# Patient Record
Sex: Female | Born: 1970
Health system: Southern US, Community
[De-identification: ages and names within clinical notes are randomized; demographics above are authoritative.]

## PROBLEM LIST (undated history)

## (undated) ENCOUNTER — Emergency Department (HOSPITAL_COMMUNITY): Discharge status: Absent without leave | Payer: Self-pay

## (undated) DIAGNOSIS — J189 Pneumonia, unspecified organism: Secondary | ICD-10-CM

## (undated) DIAGNOSIS — G5603 Carpal tunnel syndrome, bilateral upper limbs: Secondary | ICD-10-CM

## (undated) DIAGNOSIS — F419 Anxiety disorder, unspecified: Secondary | ICD-10-CM

## (undated) DIAGNOSIS — D649 Anemia, unspecified: Secondary | ICD-10-CM

## (undated) DIAGNOSIS — K219 Gastro-esophageal reflux disease without esophagitis: Secondary | ICD-10-CM

## (undated) DIAGNOSIS — M170 Bilateral primary osteoarthritis of knee: Secondary | ICD-10-CM

## (undated) DIAGNOSIS — J45909 Unspecified asthma, uncomplicated: Secondary | ICD-10-CM

## (undated) DIAGNOSIS — D869 Sarcoidosis, unspecified: Secondary | ICD-10-CM

## (undated) DIAGNOSIS — M4800 Spinal stenosis, site unspecified: Secondary | ICD-10-CM

## (undated) DIAGNOSIS — E785 Hyperlipidemia, unspecified: Secondary | ICD-10-CM

## (undated) DIAGNOSIS — M5136 Other intervertebral disc degeneration, lumbar region: Secondary | ICD-10-CM

## (undated) DIAGNOSIS — M51369 Other intervertebral disc degeneration, lumbar region without mention of lumbar back pain or lower extremity pain: Secondary | ICD-10-CM

## (undated) DIAGNOSIS — G4733 Obstructive sleep apnea (adult) (pediatric): Secondary | ICD-10-CM

## (undated) DIAGNOSIS — T7840XA Allergy, unspecified, initial encounter: Secondary | ICD-10-CM

## (undated) DIAGNOSIS — M069 Rheumatoid arthritis, unspecified: Secondary | ICD-10-CM

## (undated) DIAGNOSIS — E559 Vitamin D deficiency, unspecified: Secondary | ICD-10-CM

## (undated) DIAGNOSIS — I10 Essential (primary) hypertension: Secondary | ICD-10-CM

## (undated) DIAGNOSIS — F32A Depression, unspecified: Secondary | ICD-10-CM

## (undated) DIAGNOSIS — R7303 Prediabetes: Secondary | ICD-10-CM

## (undated) DIAGNOSIS — E119 Type 2 diabetes mellitus without complications: Secondary | ICD-10-CM

## (undated) HISTORY — DX: Vitamin D deficiency, unspecified: E55.9

## (undated) HISTORY — DX: Essential (primary) hypertension: I10

## (undated) HISTORY — PX: ESOPHAGOGASTRODUODENOSCOPY: SHX1529

## (undated) HISTORY — DX: Other intervertebral disc degeneration, lumbar region without mention of lumbar back pain or lower extremity pain: M51.369

## (undated) HISTORY — DX: Spinal stenosis, site unspecified: M48.00

## (undated) HISTORY — PX: ABDOMINAL HYSTERECTOMY: SHX81

## (undated) HISTORY — DX: Allergy, unspecified, initial encounter: T78.40XA

## (undated) HISTORY — DX: Bilateral primary osteoarthritis of knee: M17.0

## (undated) HISTORY — DX: Sarcoidosis, unspecified: D86.9

## (undated) HISTORY — DX: Unspecified asthma, uncomplicated: J45.909

## (undated) HISTORY — DX: Type 2 diabetes mellitus without complications: E11.9

## (undated) HISTORY — DX: Obstructive sleep apnea (adult) (pediatric): G47.33

## (undated) HISTORY — PX: FOOT SURGERY: SHX648

## (undated) HISTORY — DX: Hyperlipidemia, unspecified: E78.5

## (undated) HISTORY — DX: Gastro-esophageal reflux disease without esophagitis: K21.9

## (undated) HISTORY — DX: Carpal tunnel syndrome, bilateral upper limbs: G56.03

## (undated) HISTORY — DX: Other intervertebral disc degeneration, lumbar region: M51.36

## (undated) HISTORY — DX: Anxiety disorder, unspecified: F41.9

---

## 1998-04-27 ENCOUNTER — Other Ambulatory Visit: Admission: RE | Admit: 1998-04-27 | Discharge: 1998-04-27 | Payer: Self-pay | Admitting: Nephrology

## 1999-06-16 ENCOUNTER — Other Ambulatory Visit: Admission: RE | Admit: 1999-06-16 | Discharge: 1999-06-16 | Payer: Self-pay | Admitting: *Deleted

## 2000-12-05 ENCOUNTER — Other Ambulatory Visit: Admission: RE | Admit: 2000-12-05 | Discharge: 2000-12-05 | Payer: Self-pay | Admitting: Obstetrics

## 2001-11-18 ENCOUNTER — Encounter: Payer: Self-pay | Admitting: Nephrology

## 2001-11-18 ENCOUNTER — Encounter: Admission: RE | Admit: 2001-11-18 | Discharge: 2001-11-18 | Payer: Self-pay | Admitting: Nephrology

## 2002-02-11 ENCOUNTER — Encounter: Admission: RE | Admit: 2002-02-11 | Discharge: 2002-02-11 | Payer: Self-pay | Admitting: Occupational Medicine

## 2002-02-11 ENCOUNTER — Encounter: Payer: Self-pay | Admitting: Occupational Medicine

## 2004-08-03 ENCOUNTER — Emergency Department (HOSPITAL_COMMUNITY): Admission: EM | Admit: 2004-08-03 | Discharge: 2004-08-03 | Payer: Self-pay | Admitting: Emergency Medicine

## 2005-02-14 ENCOUNTER — Emergency Department (HOSPITAL_COMMUNITY): Admission: EM | Admit: 2005-02-14 | Discharge: 2005-02-14 | Payer: Self-pay | Admitting: Family Medicine

## 2005-04-15 ENCOUNTER — Emergency Department (HOSPITAL_COMMUNITY): Admission: EM | Admit: 2005-04-15 | Discharge: 2005-04-16 | Payer: Self-pay | Admitting: *Deleted

## 2005-04-19 ENCOUNTER — Encounter: Admission: RE | Admit: 2005-04-19 | Discharge: 2005-04-19 | Payer: Self-pay | Admitting: Family Medicine

## 2005-04-21 ENCOUNTER — Encounter: Admission: RE | Admit: 2005-04-21 | Discharge: 2005-05-19 | Payer: Self-pay | Admitting: Family Medicine

## 2005-12-22 ENCOUNTER — Encounter: Admission: RE | Admit: 2005-12-22 | Discharge: 2005-12-22 | Payer: Self-pay | Admitting: Nephrology

## 2006-01-03 ENCOUNTER — Ambulatory Visit: Payer: Self-pay | Admitting: Internal Medicine

## 2006-02-01 ENCOUNTER — Ambulatory Visit: Payer: Self-pay | Admitting: Internal Medicine

## 2006-03-22 ENCOUNTER — Ambulatory Visit (HOSPITAL_COMMUNITY): Admission: RE | Admit: 2006-03-22 | Discharge: 2006-03-22 | Payer: Self-pay | Admitting: Obstetrics

## 2006-04-02 ENCOUNTER — Encounter: Admission: RE | Admit: 2006-04-02 | Discharge: 2006-04-02 | Payer: Self-pay | Admitting: Nephrology

## 2006-04-05 ENCOUNTER — Ambulatory Visit (HOSPITAL_COMMUNITY): Admission: RE | Admit: 2006-04-05 | Discharge: 2006-04-05 | Payer: Self-pay | Admitting: Nephrology

## 2006-04-17 ENCOUNTER — Ambulatory Visit: Payer: Self-pay | Admitting: Gastroenterology

## 2007-05-01 ENCOUNTER — Ambulatory Visit: Payer: Self-pay | Admitting: Gastroenterology

## 2007-05-01 LAB — CONVERTED CEMR LAB
Albumin: 3 g/dL — ABNORMAL LOW (ref 3.5–5.2)
Basophils Absolute: 0 10*3/uL (ref 0.0–0.1)
Basophils Relative: 0.3 % (ref 0.0–1.0)
Bilirubin, Direct: 0.1 mg/dL (ref 0.0–0.3)
CO2: 29 meq/L (ref 19–32)
Eosinophils Relative: 1.5 % (ref 0.0–5.0)
GFR calc Af Amer: 121 mL/min
Glucose, Bld: 103 mg/dL — ABNORMAL HIGH (ref 70–99)
Lymphocytes Relative: 35.1 % (ref 12.0–46.0)
MCHC: 33.2 g/dL (ref 30.0–36.0)
Monocytes Absolute: 0.8 10*3/uL — ABNORMAL HIGH (ref 0.2–0.7)
Neutrophils Relative %: 55.9 % (ref 43.0–77.0)
Platelets: 373 10*3/uL (ref 150–400)
Potassium: 3.8 meq/L (ref 3.5–5.1)
RDW: 14 % (ref 11.5–14.6)
Sodium: 144 meq/L (ref 135–145)
WBC: 11.8 10*3/uL — ABNORMAL HIGH (ref 4.5–10.5)

## 2007-05-07 ENCOUNTER — Ambulatory Visit: Payer: Self-pay | Admitting: Gastroenterology

## 2007-05-17 ENCOUNTER — Encounter: Admission: RE | Admit: 2007-05-17 | Discharge: 2007-05-17 | Payer: Self-pay | Admitting: Neurology

## 2007-08-07 ENCOUNTER — Encounter: Admission: RE | Admit: 2007-08-07 | Discharge: 2007-08-07 | Payer: Self-pay | Admitting: Neurology

## 2007-10-08 ENCOUNTER — Ambulatory Visit (HOSPITAL_COMMUNITY): Admission: RE | Admit: 2007-10-08 | Discharge: 2007-10-08 | Payer: Self-pay | Admitting: Obstetrics

## 2007-10-29 ENCOUNTER — Ambulatory Visit (HOSPITAL_COMMUNITY): Admission: RE | Admit: 2007-10-29 | Discharge: 2007-10-29 | Payer: Self-pay | Admitting: Obstetrics

## 2007-10-29 ENCOUNTER — Encounter (INDEPENDENT_AMBULATORY_CARE_PROVIDER_SITE_OTHER): Payer: Self-pay | Admitting: Obstetrics

## 2008-10-14 ENCOUNTER — Ambulatory Visit (HOSPITAL_COMMUNITY): Admission: RE | Admit: 2008-10-14 | Discharge: 2008-10-14 | Payer: Self-pay | Admitting: Emergency Medicine

## 2009-10-11 ENCOUNTER — Encounter (INDEPENDENT_AMBULATORY_CARE_PROVIDER_SITE_OTHER): Payer: Self-pay | Admitting: Obstetrics and Gynecology

## 2009-10-11 ENCOUNTER — Ambulatory Visit (HOSPITAL_COMMUNITY): Admission: RE | Admit: 2009-10-11 | Discharge: 2009-10-12 | Payer: Self-pay | Admitting: Obstetrics and Gynecology

## 2010-10-05 ENCOUNTER — Encounter: Admission: RE | Admit: 2010-10-05 | Discharge: 2010-10-05 | Payer: Self-pay | Admitting: Obstetrics and Gynecology

## 2010-12-20 ENCOUNTER — Emergency Department (HOSPITAL_COMMUNITY)
Admission: EM | Admit: 2010-12-20 | Discharge: 2010-12-20 | Payer: Self-pay | Source: Home / Self Care | Admitting: Emergency Medicine

## 2010-12-22 ENCOUNTER — Encounter
Admission: RE | Admit: 2010-12-22 | Discharge: 2010-12-22 | Payer: Self-pay | Source: Home / Self Care | Attending: Internal Medicine | Admitting: Internal Medicine

## 2011-01-08 ENCOUNTER — Encounter
Admission: RE | Admit: 2011-01-08 | Discharge: 2011-01-08 | Payer: Self-pay | Source: Home / Self Care | Attending: Geriatric Medicine | Admitting: Geriatric Medicine

## 2011-01-15 ENCOUNTER — Encounter: Payer: Self-pay | Admitting: Neurology

## 2011-01-15 ENCOUNTER — Encounter: Payer: Self-pay | Admitting: Nephrology

## 2011-01-15 ENCOUNTER — Encounter: Payer: Self-pay | Admitting: Obstetrics

## 2011-03-06 LAB — POCT URINALYSIS DIPSTICK
Bilirubin Urine: NEGATIVE
Glucose, UA: NEGATIVE mg/dL
Ketones, ur: NEGATIVE mg/dL
Specific Gravity, Urine: >=1.03 (ref 1.005–1.030)

## 2011-03-06 LAB — GAMMA GT: GGT: 102 U/L — ABNORMAL HIGH (ref 7–51)

## 2011-03-06 LAB — POCT PREGNANCY, URINE: Preg Test, Ur: NEGATIVE

## 2011-03-06 LAB — AST: AST: 22 U/L (ref 0–37)

## 2011-03-30 LAB — BASIC METABOLIC PANEL
BUN: 4 mg/dL — ABNORMAL LOW (ref 6–23)
BUN: 8 mg/dL (ref 6–23)
CO2: 24 mEq/L (ref 19–32)
Calcium: 9.2 mg/dL (ref 8.4–10.5)
Calcium: 9.3 mg/dL (ref 8.4–10.5)
GFR calc non Af Amer: >60 mL/min (ref >60)
GFR calc non Af Amer: >60 mL/min (ref >60)
Glucose, Bld: 123 mg/dL — ABNORMAL HIGH (ref 70–99)
Glucose, Bld: 90 mg/dL (ref 70–99)
Sodium: 137 mEq/L (ref 135–145)

## 2011-03-30 LAB — HIV ANTIBODY (ROUTINE TESTING W REFLEX): HIV: NONREACTIVE

## 2011-03-30 LAB — CBC
HCT: 41 % (ref 36.0–46.0)
Hemoglobin: 13.5 g/dL (ref 12.0–15.0)
Hemoglobin: 13.3 g/dL (ref 12.0–15.0)
MCHC: 32.7 g/dL (ref 30.0–36.0)
MCHC: 32.5 g/dL (ref 30.0–36.0)
MCV: 83.4 fL (ref 78.0–100.0)
Platelets: 296 10*3/uL (ref 150–400)
RDW: 12.9 % (ref 11.5–15.5)

## 2011-03-30 LAB — RPR: RPR Ser Ql: NONREACTIVE

## 2011-05-09 NOTE — Op Note (Signed)
Robin [name], Robin [name]    ACCOUNT NO.:  [id number]   MEDICAL RECORD NO.:  [id number]          PATIENT TYPE:  AMB   LOCATION:  SDC                           FACILITY:  [facility]   PHYSICIAN:  Kathreen Cosier, M.D.DATE OF BIRTH:  [birth year]   DATE OF PROCEDURE:  10/29/2007  DATE OF DISCHARGE:                               OPERATIVE REPORT   PREOPERATIVE DIAGNOSIS:  Dysfunctional uterine bleeding.   POSTOPERATIVE DIAGNOSIS:  Dysfunctional uterine bleeding.   Under general anesthesia with the patient in the lithotomy position, the  perineum and vagina were prepped and draped, and  bladder emptied with  straight catheter.  Bimanual exam revealed the uterus to be enlarged.  Adnexa negative.  A speculum was inserted in the vagina.  The cervix  injected with 10 mL 1% Xylocaine and the endometrial cavity sounded 10  cm. The endocervix was curetted and a small amount of tissue obtained.  The cervical length was then measured at 5 cm giving a cavity length of  5 cm.  The cervix was dilated to a #25 Pratt and hysteroscope inserted.  She had a normal cavity.  The hysteroscope was removed and a sharp  curettage done.  A moderate amount of tissue was obtained.  The cavity  width was 4.85 cm.  The NovaSure device was inserted and the cavity  integrity established.  Ablation was performed at 124 watts for 60  seconds. Repeat hysteroscopy showed total ablation of the cavity.  The  patient tolerated the procedure well.  The fluid deficit was 50 mL.           ______________________________  Kathreen Cosier, M.D.     BAM/MEDQ  D:  10/29/2007  T:  10/29/2007  Job:  [id number]

## 2011-05-09 NOTE — Assessment & Plan Note (Signed)
[facility]                         GASTROENTEROLOGY OFFICE NOTE   NAME:Robin [name]             MRN:          [id number]  DATE:05/01/2007                            DOB:          Jan 08, 1971    PRIMARY CARE PHYSICIAN:  Dr. Jeri Cos   GI PROBLEM LIST:  1. Morbid obesity.  2. Likely gastroesophageal reflux disease. Classic pyrosis for years.      Discomforts when bending over or burning worse at night.  Was      scheduled to have EGD April 2007, patient cancelled the      appointment.   INTERVAL HISTORY:  I last saw Robin [name] a year ago for rather classic  GERD symptoms, intermittent vomiting and intermittent dysphagia.  I  arranged for her to have an EGD but she cancelled the procedure and I  have not heard from her since.  My recommendations at that time were for  her to take her PPI (OTC Prilosec) 20 to 30 minutes prior to a meal as  she seemed to be taking it at an incorrect time in relation to food.  She had a recent CT scan April 2007, with IV and oral contrast and it  was normal.  She had a recent upper GI series and that was normal except  for a patchless GE junction with obvious refluxing.   She now returns with many of the same complaints as well as some new  complaints.  She describes a vomiting and diarrhea illness about a week  ago, lasted for 2 to 3 days.  She had 10 to 15 watery non-bloody stools  during that time.  She works at a nursing home, thought she may have  contracted something.  Her stools have gone back to normal, except for  they are a bit looser than usual.  She is only having 1 bowel movement a  day now.  She has a variety of upper GI complaints including vomiting  every 1 to 2 months.  Rare, intermittent solid food dysphagia,  discomfort in her right upper quadrant, that seems rather constant.   CURRENT MEDICATIONS:  1. Sertraline.  2. Prilosec OTC.   PHYSICAL EXAMINATION:  Weight is 313 pounds,  which is up 27 pounds since  her last visit. Blood pressure 124/82, pulse 92.  CONSTITUTIONAL:  Morbidly obese, otherwise well-appearing.  LUNGS:  Clear to auscultation bilaterally.  HEART:  Regular rate and rhythm.  ABDOMEN:  Soft, nontender, nondistended, normal bowel sounds.   ASSESSMENT AND PLAN:  A 40 year old woman with multiple upper  gastrointestinal complaints.   I think we should proceed with esophagogastroduodenoscopy at her soonest  convenience.  I recommended this a year ago and she cancelled at the  last minute, hopefully she will follow through this time.  She does seem  to have rather classic gastroesophageal reflux disease symptoms and last  year I recommended that she begin taking her proton pump inhibitor 20 to  30 minutes prior to a meal.  She has not been doing that, she takes it  at bedtime and sometimes after a meal.  I recommended again that she  take it 20 to 30  minutes prior to either breakfast or lunch meal, that  is the way the pill is designed to work most effectively.  She believes  she is lactose intolerance and I educated her on lactate pills and  lactate milk, given her gastroesophageal reflux disease handout as well  as a lactose intolerance handout.  I also explained that many of her  discomforts could be weight related.  She has gained 27 pounds in a year  and is quite obese.  I recommended a slow gradual weight loss program  and that may alleviate many of her abdominal discomforts.  She will get  a basic set of labs today in addition to the esophagogastroduodenoscopy  that we are scheduling.     Rachael Fee, MD  Electronically Signed    DPJ/MedQ  DD: 05/01/2007  DT: 05/01/2007  Job #: [id number]   cc:   Jarome Matin, M.D.

## 2011-10-04 LAB — CBC
MCHC: 33
MCV: 76.7 — ABNORMAL LOW
Platelets: 432 — ABNORMAL HIGH
RBC: 4.65
WBC: 11.3 — ABNORMAL HIGH

## 2011-10-04 LAB — DIFFERENTIAL
Basophils Relative: 1
Eosinophils Absolute: 0.2
Neutro Abs: 6
Neutrophils Relative %: 53

## 2011-10-04 LAB — PREGNANCY, URINE: Preg Test, Ur: NEGATIVE

## 2011-10-17 ENCOUNTER — Other Ambulatory Visit: Payer: Self-pay | Admitting: Internal Medicine

## 2011-10-17 DIAGNOSIS — Z1231 Encounter for screening mammogram for malignant neoplasm of breast: Secondary | ICD-10-CM

## 2011-10-25 ENCOUNTER — Inpatient Hospital Stay: Admission: RE | Admit: 2011-10-25 | Payer: Self-pay | Source: Ambulatory Visit

## 2012-10-14 ENCOUNTER — Ambulatory Visit
Admission: RE | Admit: 2012-10-14 | Discharge: 2012-10-14 | Disposition: A | Discharge status: Home / Self Care | Payer: BC Managed Care – PPO | Source: Ambulatory Visit | Attending: Internal Medicine | Admitting: Internal Medicine

## 2012-10-14 DIAGNOSIS — Z1231 Encounter for screening mammogram for malignant neoplasm of breast: Secondary | ICD-10-CM

## 2013-10-07 ENCOUNTER — Other Ambulatory Visit: Payer: Self-pay

## 2013-10-07 DIAGNOSIS — Z1231 Encounter for screening mammogram for malignant neoplasm of breast: Secondary | ICD-10-CM

## 2013-10-29 ENCOUNTER — Ambulatory Visit
Admission: RE | Admit: 2013-10-29 | Discharge: 2013-10-29 | Disposition: A | Discharge status: Home / Self Care | Payer: BC Managed Care – PPO | Source: Ambulatory Visit

## 2013-10-29 DIAGNOSIS — Z1231 Encounter for screening mammogram for malignant neoplasm of breast: Secondary | ICD-10-CM

## 2014-02-16 ENCOUNTER — Other Ambulatory Visit: Payer: Self-pay | Admitting: Internal Medicine

## 2014-02-16 DIAGNOSIS — E010 Iodine-deficiency related diffuse (endemic) goiter: Secondary | ICD-10-CM

## 2014-02-19 ENCOUNTER — Other Ambulatory Visit: Payer: BC Managed Care – PPO

## 2014-02-23 ENCOUNTER — Ambulatory Visit
Admission: RE | Admit: 2014-02-23 | Discharge: 2014-02-23 | Disposition: A | Discharge status: Home / Self Care | Payer: BC Managed Care – PPO | Source: Ambulatory Visit | Attending: Internal Medicine | Admitting: Internal Medicine

## 2014-02-23 DIAGNOSIS — E010 Iodine-deficiency related diffuse (endemic) goiter: Secondary | ICD-10-CM

## 2014-09-16 ENCOUNTER — Ambulatory Visit (HOSPITAL_BASED_OUTPATIENT_CLINIC_OR_DEPARTMENT_OTHER): Payer: BC Managed Care – PPO

## 2014-10-20 ENCOUNTER — Ambulatory Visit (INDEPENDENT_AMBULATORY_CARE_PROVIDER_SITE_OTHER): Payer: BLUE CROSS/BLUE SHIELD

## 2014-10-20 VITALS — BP 150/80 | HR 68 | Resp 14 | Ht 70.0 in | Wt 225.0 lb

## 2014-10-20 DIAGNOSIS — M79671 Pain in right foot: Secondary | ICD-10-CM

## 2014-10-20 DIAGNOSIS — M722 Plantar fascial fibromatosis: Secondary | ICD-10-CM

## 2014-10-20 DIAGNOSIS — M7731 Calcaneal spur, right foot: Secondary | ICD-10-CM

## 2014-10-20 MED ORDER — TRIAMCINOLONE ACETONIDE 10 MG/ML IJ SUSP: 10.0000 mg | Freq: Once | INTRAMUSCULAR | Status: DC

## 2014-10-20 MED ORDER — MELOXICAM 15 MG PO TABS: 15.0000 mg | ORAL_TABLET | Freq: Every day | ORAL | Status: DC

## 2014-10-20 NOTE — Progress Notes (Signed)
   Subjective:    Patient ID: Robin [name], female    DOB: 1971/10/17, 43 y.o.   MRN: [id number]  HPI Comments: N heel pain L right heel and inferior arch  D 2 weeks O then walked 1/2 marathon on 10/17/2014 C swelling, severe sharp pain A stretching, standing, worse after rest and in the morning T frozen water bottle, Ibuprofen     Review of Systems  All other systems reviewed and are negative.      Objective:   Physical Exam 43 year old F connecting female well-developed well-nourished oriented 3 presents at this time with a several month history of at least 2 week history of severe pain inferior right heel pain on first step or getting a fall. Of rest runner didn't half marathon release she's been stretching standing Weight on her heel using frozen bottle of water and ibuprofen. Lower extremity objective findings reveal neurovascular status is intact pedal pulses are palpable DP +2 PT +2 over 4 Refill time 3 seconds epicritic and proprioceptive sensations intact and symmetric. Patient has per 3 foot type with pes planus past valgus deformity right more so than left history of previous bunion surgery with residual hallux limitus rigidus and arthropathy first MTP joint noted. X-rays reveal well-developed inferior calcaneal spur sticking a plantar fascial structuresof fracture or osseous abnormality noted remainder the exam unremarkable       Assessment & Plan:  Assessment plantar fasciitis O'Shields procedure right at this time injection time of Kenalog 20 mg Xylocaine plain to the right heel fascial strapping was applied to the right foot for 5 days also prescription for mobility is given his alternatives to ibuprofen recommend ice to heal every evening recheck in 2 weeks may be candidate for orthoses based on progress decrease weight working activities and exercise activities no ballistic activities minimal walking on level surfaces maintain a stable shoe no barefoot no  flimsy shoes or flip-flops X  Alvan Dame DPM

## 2014-10-20 NOTE — Patient Instructions (Signed)
Plantar Fasciitis Plantar fasciitis is a common condition that causes foot pain. It is soreness (inflammation) of the band of tough fibrous tissue on the bottom of the foot that runs from the heel bone (calcaneus) to the ball of the foot. The cause of this soreness may be from excessive standing, poor fitting shoes, running on hard surfaces, being overweight, having an abnormal walk, or overuse (this is common in runners) of the painful foot or feet. It is also common in aerobic exercise dancers and ballet dancers. SYMPTOMS  Most people with plantar fasciitis complain of:  Severe pain in the morning on the bottom of their foot especially when taking the first steps out of bed. This pain recedes after a few minutes of walking.  Severe pain is experienced also during walking following a long period of inactivity.  Pain is worse when walking barefoot or up stairs DIAGNOSIS   Your caregiver will diagnose this condition by examining and feeling your foot.  Special tests such as X-rays of your foot, are usually not needed. PREVENTION   Consult a sports medicine professional before beginning a new exercise program.  Walking programs offer a good workout. With walking there is a lower chance of overuse injuries common to runners. There is less impact and less jarring of the joints.  Begin all new exercise programs slowly. If problems or pain develop, decrease the amount of time or distance until you are at a comfortable level.  Wear good shoes and replace them regularly.  Stretch your foot and the heel cords at the back of the ankle (Achilles tendon) both before and after exercise.  Run or exercise on even surfaces that are not hard. For example, asphalt is better than pavement.  Do not run barefoot on hard surfaces.  If using a treadmill, vary the incline.  Do not continue to workout if you have foot or joint problems. Seek professional help if they do not improve. HOME CARE INSTRUCTIONS     Avoid activities that cause you pain until you recover.  Use ice or cold packs on the problem or painful areas after working out.  Only take over-the-counter or prescription medicines for pain, discomfort, or fever as directed by your caregiver.  Soft shoe inserts or athletic shoes with air or gel sole cushions may be helpful.  If problems continue or become more severe, consult a sports medicine caregiver or your own health care provider. Cortisone is a potent anti-inflammatory medication that may be injected into the painful area. You can discuss this treatment with your caregiver. MAKE SURE YOU:   Understand these instructions.  Will watch your condition.  Will get help right away if you are not doing well or get worse. Document Released: 09/05/2001 Document Revised: 03/04/2012 Document Reviewed: 11/04/2008 ExitCare Patient Information 2015 [employer]. This information is not intended to replace advice given to you by your health care provider. Make sure you discuss any questions you have with your health care provider.    ICE INSTRUCTIONS  Apply ice or cold pack to the affected area at least 3 times a day for 10-15 minutes each time.  You should also use ice after prolonged activity or vigorous exercise.  Do not apply ice longer than 20 minutes at one time.  Always keep a cloth between your skin and the ice pack to prevent burns.  Being consistent and following these instructions will help control your symptoms.  We suggest you purchase a gel ice pack because they are   reusable and do bit leak.  Some of them are designed to wrap around the area.  Use the method that works best for you.  Here are some other suggestions for icing.   Use a frozen bag of peas or corn-inexpensive and molds well to your body, usually stays frozen for 10 to 20 minutes.  Wet a towel with cold water and squeeze out the excess until it's damp.  Place in a bag in the freezer for 20 minutes. Then remove  and use. 

## 2014-10-26 ENCOUNTER — Ambulatory Visit (INDEPENDENT_AMBULATORY_CARE_PROVIDER_SITE_OTHER): Payer: BC Managed Care – PPO | Admitting: Pulmonary Disease

## 2014-10-26 ENCOUNTER — Encounter: Payer: Self-pay | Admitting: Pulmonary Disease

## 2014-10-26 VITALS — BP 120/62 | HR 63 | Temp 98.0°F | Ht 71.0 in | Wt 281.8 lb

## 2014-10-26 DIAGNOSIS — G4733 Obstructive sleep apnea (adult) (pediatric): Secondary | ICD-10-CM

## 2014-10-26 NOTE — Assessment & Plan Note (Signed)
The patient has a history of significant obstructive sleep apnea, and currently is having issues with her device. She is complaining of a strong odor, and also has not had updated supplies in quite some time. She has lost significant weight since her study in 2010, but clearly has persistent sleep apnea by her history. I would like to get her a new device, and also re-optimize her pressure. I have also encouraged her to work aggressively on weight loss.

## 2014-10-26 NOTE — Progress Notes (Signed)
Subjective:    Patient ID: Robin [name], female    DOB: [birth year], 43 y.o.   MRN: [id number]  HPI The patient is a 43 year old female who I've been asked to see for management of obstructive sleep apnea. She was apparently diagnosed with significant sleep apnea in 2010, and was started on C Pap. She has not had any updates or new supplies since that time, and tells me that her machine is malodorous and her mask and tubing or falling apart. This has made it very difficult to wear the C Pap. She is also lost over 80 pounds since her initial study, but still has symptoms consistent with sleep disordered breathing. The patient has been noted to have loud snoring, but no one has mentioned witnessed apneas. She denies gasping arousals. She is unrested about 50% of the mornings, but does not have issues with sleepiness during the day while at work. However, she will take a nap every day after she returns from work, and still has issues with sleepiness watching television or movies in the evening despite this. She also gets sleepy driving longer distances. The patient's Epworth score today is 10.   Sleep Questionnaire What time do you typically go to bed?( Between what hours) 9-10 PM 9-10 PM at 1548 on 10/26/14 by Tommie Sams, CMA How long does it take you to fall asleep? 30-45 min 30-45 min at 1548 on 10/26/14 by Tommie Sams, CMA How many times during the night do you wake up? 5 5 at 1548 on 10/26/14 by Tommie Sams, CMA What time do you get out of bed to start your day? [id number] at 1548 on 10/26/14 by Tommie Sams, CMA Do you drive or operate heavy machinery in your occupation? No No at 1548 on 10/26/14 by Tommie Sams, CMA How much has your weight changed (up or down) over the past two years? (In pounds) 80 lb (36.288 kg) 80 lb (36.288 kg) at 1548 on 10/26/14 by Tommie Sams, CMA Have you ever had a sleep study before? Yes Yes at 1548 on 10/26/14 by Tommie Sams, CMA  If yes, location of study? ? ? at 1548 on 10/26/14 by Tommie Sams, CMA If yes, date of study? ?2011 ?2011 at 1548 on 10/26/14 by Tommie Sams, CMA Do you currently use CPAP? Yes Yes at 1548 on 10/26/14 by Tommie Sams, CMA If so, what pressure? not sure not sure at 1548 on 10/26/14 by Tommie Sams, CMA Do you wear oxygen at any time? No No at 1548 on 10/26/14 by Tommie Sams, CMA   Review of Systems  Constitutional: Negative for fever and unexpected weight change.  HENT: Negative for congestion, dental problem, ear pain, nosebleeds, postnasal drip, rhinorrhea, sinus pressure, sneezing, sore throat and trouble swallowing.   Eyes: Negative for redness and itching.  Respiratory: Positive for shortness of breath. Negative for cough, chest tightness and wheezing.   Cardiovascular: Negative for palpitations and leg swelling.  Gastrointestinal: Negative for nausea and vomiting.  Genitourinary: Negative for dysuria.  Musculoskeletal: Negative for joint swelling.  Skin: Negative for rash.  Neurological: Negative for headaches.  Hematological: Does not bruise/bleed easily.  Psychiatric/Behavioral: Negative for dysphoric mood. The patient is not nervous/anxious.        Objective:   Physical Exam Constitutional:  Overweight female, no acute distress  HENT:  Nares patent without discharge  Oropharynx without exudate, palate and uvula are elongated.  Eyes:  Perrla,  eomi, no scleral icterus  Neck:  No JVD, no TMG  Cardiovascular:  Normal rate, regular rhythm, no rubs or gallops.  No murmurs        Intact distal pulses  Pulmonary :  Normal breath sounds, no stridor or respiratory distress   No rales, rhonchi, or wheezing  Abdominal:  Soft, nondistended, bowel sounds present.  No tenderness noted.   Musculoskeletal:  mild lower extremity edema noted.  Lymph Nodes:  No cervical lymphadenopathy noted  Skin:  No cyanosis noted  Neurologic:  Alert, appropriate, moves all 4  extremities without obvious deficit.        Assessment & Plan:

## 2014-10-26 NOTE — Patient Instructions (Signed)
Will see about getting you a new cpap machine and supplies.  Will also re-optimize your pressure after your significant weight loss Keep working on weight loss followup with me 8 weeks after getting your new cpap device, but please call if having issues with tolerance.

## 2014-10-28 ENCOUNTER — Telehealth: Payer: Self-pay | Admitting: Pulmonary Disease

## 2014-10-28 NOTE — Telephone Encounter (Signed)
Called spoke with Melissa from [facility]. She reports pt received her machine from [facility] in 09/2010--pt not eligible for new machine until 10/2015. Pt has [employer] and they are requiring she go through the repair process for her current machine. They are going to set pt up with supplies. Sleep study is in [facility] look at.  Will make [facility] aware

## 2014-10-30 ENCOUNTER — Other Ambulatory Visit: Payer: Self-pay | Admitting: Pulmonary Disease

## 2014-10-30 DIAGNOSIS — G4733 Obstructive sleep apnea (adult) (pediatric): Secondary | ICD-10-CM

## 2014-10-30 NOTE — Telephone Encounter (Signed)
Just make sure they are going to repair her machine!!

## 2014-10-30 NOTE — Telephone Encounter (Signed)
Called and spoke with patient and she is aware that [employer] will be providing her a loaner auto cpap x 2 -3 wks. Pt is aware that she will be hearing from [facility] and to contact our office for download results after they pick up her loaner cpap. Pt voiced understanding and is aware. Rhonda J Cobb

## 2014-11-03 ENCOUNTER — Ambulatory Visit: Payer: BC Managed Care – PPO

## 2014-12-14 ENCOUNTER — Encounter: Payer: Self-pay | Admitting: Pulmonary Disease

## 2014-12-23 ENCOUNTER — Ambulatory Visit: Payer: BC Managed Care – PPO | Admitting: Pulmonary Disease

## 2016-05-11 DIAGNOSIS — F329 Major depressive disorder, single episode, unspecified: Secondary | ICD-10-CM | POA: Diagnosis not present

## 2016-05-11 DIAGNOSIS — I10 Essential (primary) hypertension: Secondary | ICD-10-CM | POA: Diagnosis not present

## 2016-10-06 ENCOUNTER — Other Ambulatory Visit: Payer: Self-pay | Admitting: Internal Medicine

## 2016-10-06 DIAGNOSIS — Z1231 Encounter for screening mammogram for malignant neoplasm of breast: Secondary | ICD-10-CM

## 2016-10-16 ENCOUNTER — Ambulatory Visit: Payer: Self-pay

## 2016-10-18 ENCOUNTER — Ambulatory Visit: Payer: Self-pay

## 2016-11-08 DIAGNOSIS — M1711 Unilateral primary osteoarthritis, right knee: Secondary | ICD-10-CM | POA: Diagnosis not present

## 2016-11-08 DIAGNOSIS — M25561 Pain in right knee: Secondary | ICD-10-CM | POA: Diagnosis not present

## 2016-11-08 DIAGNOSIS — G8929 Other chronic pain: Secondary | ICD-10-CM | POA: Diagnosis not present

## 2016-12-06 DIAGNOSIS — J45990 Exercise induced bronchospasm: Secondary | ICD-10-CM | POA: Diagnosis not present

## 2016-12-06 DIAGNOSIS — F3341 Major depressive disorder, recurrent, in partial remission: Secondary | ICD-10-CM | POA: Diagnosis not present

## 2016-12-06 DIAGNOSIS — R635 Abnormal weight gain: Secondary | ICD-10-CM | POA: Diagnosis not present

## 2016-12-06 DIAGNOSIS — Z23 Encounter for immunization: Secondary | ICD-10-CM | POA: Diagnosis not present

## 2016-12-06 DIAGNOSIS — I10 Essential (primary) hypertension: Secondary | ICD-10-CM | POA: Diagnosis not present

## 2016-12-06 DIAGNOSIS — E785 Hyperlipidemia, unspecified: Secondary | ICD-10-CM | POA: Diagnosis not present

## 2017-01-09 DIAGNOSIS — E785 Hyperlipidemia, unspecified: Secondary | ICD-10-CM | POA: Diagnosis not present

## 2017-01-09 DIAGNOSIS — Z0000 Encounter for general adult medical examination without abnormal findings: Secondary | ICD-10-CM | POA: Diagnosis not present

## 2017-04-30 DIAGNOSIS — M25532 Pain in left wrist: Secondary | ICD-10-CM | POA: Diagnosis not present

## 2017-04-30 DIAGNOSIS — D869 Sarcoidosis, unspecified: Secondary | ICD-10-CM | POA: Diagnosis not present

## 2017-04-30 DIAGNOSIS — M25531 Pain in right wrist: Secondary | ICD-10-CM | POA: Diagnosis not present

## 2017-04-30 DIAGNOSIS — E669 Obesity, unspecified: Secondary | ICD-10-CM | POA: Diagnosis not present

## 2017-05-25 ENCOUNTER — Other Ambulatory Visit: Payer: Self-pay | Admitting: Internal Medicine

## 2017-05-25 ENCOUNTER — Ambulatory Visit
Admission: RE | Admit: 2017-05-25 | Discharge: 2017-05-25 | Disposition: A | Discharge status: Home / Self Care | Payer: BLUE CROSS/BLUE SHIELD | Source: Ambulatory Visit | Attending: Internal Medicine | Admitting: Internal Medicine

## 2017-05-25 DIAGNOSIS — Z1231 Encounter for screening mammogram for malignant neoplasm of breast: Secondary | ICD-10-CM

## 2017-06-01 DIAGNOSIS — M25531 Pain in right wrist: Secondary | ICD-10-CM | POA: Diagnosis not present

## 2017-06-01 DIAGNOSIS — F418 Other specified anxiety disorders: Secondary | ICD-10-CM | POA: Diagnosis not present

## 2017-06-01 DIAGNOSIS — I10 Essential (primary) hypertension: Secondary | ICD-10-CM | POA: Diagnosis not present

## 2017-08-07 DIAGNOSIS — M62838 Other muscle spasm: Secondary | ICD-10-CM | POA: Diagnosis not present

## 2017-08-07 DIAGNOSIS — M47812 Spondylosis without myelopathy or radiculopathy, cervical region: Secondary | ICD-10-CM | POA: Diagnosis not present

## 2017-08-09 ENCOUNTER — Other Ambulatory Visit: Payer: Self-pay | Admitting: Internal Medicine

## 2017-08-09 ENCOUNTER — Ambulatory Visit
Admission: RE | Admit: 2017-08-09 | Discharge: 2017-08-09 | Disposition: A | Discharge status: Home / Self Care | Payer: BLUE CROSS/BLUE SHIELD | Source: Ambulatory Visit | Attending: Internal Medicine | Admitting: Internal Medicine

## 2017-08-09 DIAGNOSIS — M47812 Spondylosis without myelopathy or radiculopathy, cervical region: Secondary | ICD-10-CM

## 2017-08-09 DIAGNOSIS — M50322 Other cervical disc degeneration at C5-C6 level: Secondary | ICD-10-CM | POA: Diagnosis not present

## 2017-10-31 DIAGNOSIS — D869 Sarcoidosis, unspecified: Secondary | ICD-10-CM | POA: Diagnosis not present

## 2017-10-31 DIAGNOSIS — M542 Cervicalgia: Secondary | ICD-10-CM | POA: Diagnosis not present

## 2017-10-31 DIAGNOSIS — M150 Primary generalized (osteo)arthritis: Secondary | ICD-10-CM | POA: Diagnosis not present

## 2017-10-31 DIAGNOSIS — M2550 Pain in unspecified joint: Secondary | ICD-10-CM | POA: Diagnosis not present

## 2017-11-27 DIAGNOSIS — Z23 Encounter for immunization: Secondary | ICD-10-CM | POA: Diagnosis not present

## 2017-11-27 DIAGNOSIS — M62838 Other muscle spasm: Secondary | ICD-10-CM | POA: Diagnosis not present

## 2017-11-27 DIAGNOSIS — F339 Major depressive disorder, recurrent, unspecified: Secondary | ICD-10-CM | POA: Diagnosis not present

## 2017-11-27 DIAGNOSIS — Z6841 Body Mass Index (BMI) 40.0 and over, adult: Secondary | ICD-10-CM | POA: Diagnosis not present

## 2017-11-27 DIAGNOSIS — M47812 Spondylosis without myelopathy or radiculopathy, cervical region: Secondary | ICD-10-CM | POA: Diagnosis not present

## 2017-11-29 DIAGNOSIS — M150 Primary generalized (osteo)arthritis: Secondary | ICD-10-CM | POA: Diagnosis not present

## 2017-11-29 DIAGNOSIS — D869 Sarcoidosis, unspecified: Secondary | ICD-10-CM | POA: Diagnosis not present

## 2017-11-29 DIAGNOSIS — M2550 Pain in unspecified joint: Secondary | ICD-10-CM | POA: Diagnosis not present

## 2017-11-29 DIAGNOSIS — M542 Cervicalgia: Secondary | ICD-10-CM | POA: Diagnosis not present

## 2017-12-10 ENCOUNTER — Encounter (HOSPITAL_COMMUNITY): Payer: Self-pay | Admitting: Emergency Medicine

## 2017-12-10 ENCOUNTER — Emergency Department (HOSPITAL_COMMUNITY): Payer: BLUE CROSS/BLUE SHIELD

## 2017-12-10 ENCOUNTER — Other Ambulatory Visit: Payer: Self-pay

## 2017-12-10 ENCOUNTER — Emergency Department (HOSPITAL_COMMUNITY)
Admission: EM | Admit: 2017-12-10 | Discharge: 2017-12-10 | Disposition: A | Discharge status: Home / Self Care | Payer: BLUE CROSS/BLUE SHIELD | Attending: Emergency Medicine | Admitting: Emergency Medicine

## 2017-12-10 DIAGNOSIS — N838 Other noninflammatory disorders of ovary, fallopian tube and broad ligament: Secondary | ICD-10-CM | POA: Insufficient documentation

## 2017-12-10 DIAGNOSIS — Z79899 Other long term (current) drug therapy: Secondary | ICD-10-CM | POA: Insufficient documentation

## 2017-12-10 DIAGNOSIS — I10 Essential (primary) hypertension: Secondary | ICD-10-CM | POA: Insufficient documentation

## 2017-12-10 DIAGNOSIS — R1011 Right upper quadrant pain: Secondary | ICD-10-CM | POA: Diagnosis not present

## 2017-12-10 DIAGNOSIS — J45909 Unspecified asthma, uncomplicated: Secondary | ICD-10-CM | POA: Diagnosis not present

## 2017-12-10 DIAGNOSIS — Z87891 Personal history of nicotine dependence: Secondary | ICD-10-CM | POA: Insufficient documentation

## 2017-12-10 DIAGNOSIS — N83201 Unspecified ovarian cyst, right side: Secondary | ICD-10-CM | POA: Diagnosis not present

## 2017-12-10 DIAGNOSIS — N949 Unspecified condition associated with female genital organs and menstrual cycle: Secondary | ICD-10-CM

## 2017-12-10 DIAGNOSIS — D3501 Benign neoplasm of right adrenal gland: Secondary | ICD-10-CM | POA: Diagnosis not present

## 2017-12-10 LAB — URINALYSIS, ROUTINE W REFLEX MICROSCOPIC
BILIRUBIN URINE: NEGATIVE
Glucose, UA: NEGATIVE mg/dL
HGB URINE DIPSTICK: NEGATIVE
Ketones, ur: NEGATIVE mg/dL
Leukocytes, UA: NEGATIVE
NITRITE: NEGATIVE
PH: 7 (ref 5.0–8.0)
Protein, ur: NEGATIVE mg/dL
SPECIFIC GRAVITY, URINE: 1.011 (ref 1.005–1.030)

## 2017-12-10 LAB — I-STAT CHEM 8, ED
BUN: 7 mg/dL (ref 6–20)
BUN: 10 mg/dL (ref 6–20)
CALCIUM ION: 1.1 mmol/L — AB (ref 1.15–1.40)
CHLORIDE: 103 mmol/L (ref 101–111)
CREATININE: 0.7 mg/dL (ref 0.44–1.00)
Calcium, Ion: 1.15 mmol/L (ref 1.15–1.40)
Chloride: 101 mmol/L (ref 101–111)
Creatinine, Ser: 0.7 mg/dL (ref 0.44–1.00)
Glucose, Bld: 101 mg/dL — ABNORMAL HIGH (ref 65–99)
Glucose, Bld: 99 mg/dL (ref 65–99)
HCT: 41 % (ref 36.0–46.0)
HCT: 49 % — ABNORMAL HIGH (ref 36.0–46.0)
HEMOGLOBIN: 13.9 g/dL (ref 12.0–15.0)
Hemoglobin: 16.7 g/dL — ABNORMAL HIGH (ref 12.0–15.0)
POTASSIUM: 3.3 mmol/L — AB (ref 3.5–5.1)
POTASSIUM: 5.5 mmol/L — AB (ref 3.5–5.1)
SODIUM: 140 mmol/L (ref 135–145)
SODIUM: 137 mmol/L (ref 135–145)
TCO2: 26 mmol/L (ref 22–32)
TCO2: 28 mmol/L (ref 22–32)

## 2017-12-10 LAB — CBG MONITORING, ED: GLUCOSE-CAPILLARY: 94 mg/dL (ref 65–99)

## 2017-12-10 LAB — I-STAT BETA HCG BLOOD, ED (MC, WL, AP ONLY): I-stat hCG, quantitative: <5 m[IU]/mL (ref <5)

## 2017-12-10 MED ORDER — ONDANSETRON 4 MG PO TBDP
4.0000 mg | ORAL_TABLET | Freq: Once | ORAL | Status: AC
  Administered 2017-12-10: 4 mg via ORAL
  Filled 2017-12-10: qty 1

## 2017-12-10 MED ORDER — ONDANSETRON 4 MG PO TBDP: 4.0000 mg | ORAL_TABLET | Freq: Three times a day (TID) | ORAL | 0 refills | Status: DC | PRN

## 2017-12-10 MED ORDER — KETOROLAC TROMETHAMINE 30 MG/ML IJ SOLN
30.0000 mg | Freq: Once | INTRAMUSCULAR | Status: AC
  Administered 2017-12-10: 30 mg via INTRAVENOUS
  Filled 2017-12-10: qty 1

## 2017-12-10 MED ORDER — IBUPROFEN 600 MG PO TABS: 600.0000 mg | ORAL_TABLET | Freq: Three times a day (TID) | ORAL | 0 refills | Status: DC | PRN

## 2017-12-10 MED ORDER — ONDANSETRON HCL 4 MG/2ML IJ SOLN
4.0000 mg | Freq: Once | INTRAMUSCULAR | Status: AC
  Administered 2017-12-10: 4 mg via INTRAVENOUS
  Filled 2017-12-10: qty 2

## 2017-12-10 MED ORDER — MORPHINE SULFATE (PF) 4 MG/ML IV SOLN
4.0000 mg | Freq: Once | INTRAVENOUS | Status: AC
  Administered 2017-12-10: 4 mg via INTRAVENOUS
  Filled 2017-12-10: qty 1

## 2017-12-10 MED ORDER — SODIUM CHLORIDE 0.9 % IV BOLUS (SEPSIS)
1000.0000 mL | Freq: Once | INTRAVENOUS | Status: AC
  Administered 2017-12-10: 1000 mL via INTRAVENOUS

## 2017-12-10 MED ORDER — POTASSIUM CHLORIDE CRYS ER 20 MEQ PO TBCR: 40.0000 meq | EXTENDED_RELEASE_TABLET | Freq: Every day | ORAL | 0 refills | Status: DC

## 2017-12-10 MED ORDER — OXYCODONE-ACETAMINOPHEN 5-325 MG PO TABS: 1.0000 | ORAL_TABLET | Freq: Four times a day (QID) | ORAL | 0 refills | Status: DC | PRN

## 2017-12-10 MED ORDER — HYDROMORPHONE HCL 1 MG/ML IJ SOLN
1.0000 mg | Freq: Once | INTRAMUSCULAR | Status: AC
  Administered 2017-12-10: 1 mg via INTRAVENOUS
  Filled 2017-12-10: qty 1

## 2017-12-10 MED ORDER — MORPHINE SULFATE (PF) 4 MG/ML IV SOLN
4.0000 mg | INTRAVENOUS | Status: DC | PRN
  Administered 2017-12-10: 4 mg via INTRAVENOUS
  Filled 2017-12-10: qty 1

## 2017-12-10 NOTE — ED Provider Notes (Signed)
[facility] EMERGENCY DEPARTMENT Provider Note   CSN: [id number] Arrival date & time: 12/10/17  4010     History   Chief Complaint Chief Complaint  Patient presents with  . Flank Pain    HPI Robin [name] is a 46 y.o. female.  HPI Patient is a 46 year old female presenting with acute onset right sided abdominal pain with radiation towards her flank.  She reports nausea without vomiting.  Denies diarrhea.  No fevers or chills.  No dysuria or urinary frequency.  No prior history of kidney stones.  She is never had pain like this before.  It began abruptly.  Her pain is severe in severity at this time.  Nothing improves or worsens her symptoms   Past Medical History:  Diagnosis Date  . Allergy   . Anxiety   . Asthma   . GERD (gastroesophageal reflux disease)   . Hyperlipidemia   . Hypertension   . OSA (obstructive sleep apnea)     Patient Active Problem List   Diagnosis Date Noted  . OSA (obstructive sleep apnea) 10/26/2014    Past Surgical History:  Procedure Laterality Date  . ABDOMINAL HYSTERECTOMY    . FOOT SURGERY Bilateral    Both surgeries for bunions by Dr. Wynelle Cleveland per pt    OB History    No data available       Home Medications    Prior to Admission medications   Medication Sig Start Date End Date Taking? Authorizing Provider  ATORVASTATIN CALCIUM PO Take 1 tablet by mouth daily.   Yes [provider]  buPROPion (WELLBUTRIN XL) 300 MG 24 hr tablet Take 300 mg by mouth daily.   Yes [provider]  hydrochlorothiazide (HYDRODIURIL) 25 MG tablet Take 25 mg by mouth daily.   Yes [provider]  naproxen (NAPROSYN) 500 MG tablet Take 500 mg by mouth 2 (two) times daily with a meal.   Yes [provider]  naproxen sodium (ALEVE) 220 MG tablet Take 400 mg by mouth 2 (two) times daily as needed (arthritis pain).   Yes [provider]  omeprazole (PRILOSEC OTC) 20 MG tablet Take 20 mg  by mouth daily as needed (acid reflux).    Yes [provider]  PRESCRIPTION MEDICATION Take 1 tablet by mouth at bedtime as needed (muscle spasms). Muscle relaxer that starts with "t"   Yes [provider]  meloxicam (MOBIC) 15 MG tablet Take 1 tablet (15 mg total) by mouth daily. Patient not taking: Reported on 12/10/2017 10/20/14   Alvan Dame, DPM    Family History No family history on file.  Social History Social History   Tobacco Use  . Smoking status: Former Smoker    Packs/day: 0.10    Years: 2.00    Pack years: 0.20    Types: Cigarettes    Last attempt to quit: 12/25/1996    Years since quitting: 20.9  . Smokeless tobacco: Never Used  . Tobacco comment: smoked 2-3 cigs daily x 2 years  Substance Use Topics  . Alcohol use: Yes    Alcohol/week: 0.0 oz    Comment: once a month  . Drug use: No     Allergies   Lisinopril   Review of Systems Review of Systems  All other systems reviewed and are negative.    Physical Exam Updated Vital Signs BP (!) 175/97 (BP Location: Right Arm)   Pulse 91   Temp 98 F (36.7 C) (Oral)   Resp Marland Kitchen)  24   Ht 5\' 10"  (1.778 m)   Wt (!) 139.3 kg (307 lb)   SpO2 100%   BMI 44.05 kg/m   Physical Exam  Constitutional: She is oriented to person, place, and time. She appears well-developed and well-nourished. No distress.  HENT:  Head: Normocephalic and atraumatic.  Eyes: EOM are normal.  Neck: Normal range of motion.  Cardiovascular: Normal rate, regular rhythm and normal heart sounds.  Pulmonary/Chest: Effort normal and breath sounds normal.  Abdominal: Soft. She exhibits no distension. There is no tenderness.  Musculoskeletal: Normal range of motion.  Neurological: She is alert and oriented to person, place, and time.  Skin: Skin is warm and dry.  Psychiatric: She has a normal mood and affect. Judgment normal.  Nursing note and vitals reviewed.    ED Treatments / Results  Labs (all labs ordered are  listed, but only abnormal results are displayed) Labs Reviewed  I-STAT CHEM 8, ED - Abnormal; Notable for the following components:      Result Value   Potassium 3.3 (*)    Glucose, Bld 101 (*)    All other components within normal limits  URINALYSIS, ROUTINE W REFLEX MICROSCOPIC  I-STAT BETA HCG BLOOD, ED ([facility], [facility], [facility] ONLY)    EKG  EKG Interpretation None       Radiology No results found.  Procedures Procedures (including critical care time)  Medications Ordered in ED Medications  morphine 4 MG/ML injection 4 mg (not administered)  ondansetron (ZOFRAN) injection 4 mg (4 mg Intravenous Given 12/10/17 0652)  morphine 4 MG/ML injection 4 mg (4 mg Intravenous Given 12/10/17 0654)  ketorolac (TORADOL) 30 MG/ML injection 30 mg (30 mg Intravenous Given 12/10/17 0705)     Initial Impression / Assessment and Plan / ED Course  I have reviewed the triage vital signs and the nursing notes.  Pertinent labs & imaging results that were available during my care of the patient were reviewed by me and considered in my medical decision making (see chart for details).     Concern for right ureteral colic.  Labs, urine, CT scan pending.  Symptom control at this time.  8:25 AM Care to Dr 12/12/17 to follow up on CT imaging and pain control   Final Clinical Impressions(s) / ED Diagnoses   Final diagnoses:  None    ED Discharge Orders    None       Penne Lash, MD 12/10/17 [phone]

## 2017-12-10 NOTE — ED Notes (Signed)
Pt continues to feel "funny" no longer diaphoretic, drinking gingerale, eating saltines

## 2017-12-10 NOTE — ED Provider Notes (Signed)
Received in signout.  Patient is a bit orthostatic.  Given fluids and feeling better.  Discharge home.   Melene Plan, DO 12/10/17 [phone]

## 2017-12-10 NOTE — ED Provider Notes (Addendum)
Assumed care from Dr. Patria Mane at 3:04 PM. Briefly, the patient is a 46 y.o. female with PMHx of  has a past medical history of Allergy, Anxiety, Asthma, GERD (gastroesophageal reflux disease), Hyperlipidemia, Hypertension, and OSA (obstructive sleep apnea). here with acute onset right flank pain.  Suspect stone disease.  Imaging pending..   Labs Reviewed  URINALYSIS, ROUTINE W REFLEX MICROSCOPIC - Abnormal; Notable for the following components:      Result Value   Color, Urine STRAW (*)    All other components within normal limits  I-STAT CHEM 8, ED - Abnormal; Notable for the following components:   Potassium 3.3 (*)    Glucose, Bld 101 (*)    All other components within normal limits  I-STAT BETA HCG BLOOD, ED ([facility], [facility], [facility] ONLY)  CBG MONITORING, ED  I-STAT CHEM 8, ED    Course of Care: CT scan obtained and shows large right ovarian cyst versus structure.  Suspect this is the etiology of her symptoms.  She does feel improved on my assessment, but given the size of the lesion, will obtain ultrasound for further management.  Otherwise, lab work reassuring.  Urinalysis is without signs of UTI. -Ultrasound shows large ovarian cyst, with no hemorrhage.  There is no evidence of torsion with normal flow.  She seems improved on pain.  She is status post hysterectomy and left oophorectomy.  Will give her supportive care for likely hemorrhagic/symptomatic cyst, and refer for outpatient OB follow-up.  Patient understands potential need for further MRI as indicated.  Will discharge with good return precautions.  ADDENDUM: On sitting upright, patient noted to be mildly dizzy.  I suspect this is due to her pain medications.  There is no evidence of ongoing blood loss or bleeding on her transvaginal ultrasound.  Her glucose is normal.  She was given crackers and allowed to orient, with resolution of her symptoms.  However, she remains mildly orthostatic. IVF given, will re-check istat chem with plan to d/c once  improved, able to ambulate.  Clinical Impression: 1. Adnexal cyst     Patient care transferred to Dr. Adela Lank at the end of my shift. Patient presentation, ED course, and plan of care discussed with review of all pertinent labs and imaging. Please see his/her note for further details regarding further ED course and disposition.    Shaune Pollack, MD 12/10/17 [phone]

## 2017-12-10 NOTE — ED Notes (Signed)
Pt stood up with assistance from this NT, pt then stated she did not feel dizzy just nauseous, pt then returned to sitting on bed and vomited. Pt given ginger ale and saltines.

## 2017-12-10 NOTE — ED Notes (Signed)
To CT scan

## 2017-12-10 NOTE — ED Notes (Signed)
Pt extremely diaphoretic, placed back into bed to check VS

## 2017-12-10 NOTE — ED Triage Notes (Signed)
Pt c/o R flank pain, sudden onset 430, denies n/v. Pt unable to sit in triage d/t pain

## 2017-12-10 NOTE — ED Notes (Signed)
Pt nauseated, sitting on edge of bed, not as diaphoretic. Dr. Erma Heritage aware

## 2017-12-16 ENCOUNTER — Telehealth: Payer: Self-pay | Admitting: Surgery

## 2017-12-16 NOTE — Telephone Encounter (Signed)
ED CM received call from [facility] Pharmacist to concerning notification of [facility] Narcotic protocol. Prescription will be adjusted to adhere with National protocol.

## 2018-05-10 ENCOUNTER — Ambulatory Visit: Payer: BLUE CROSS/BLUE SHIELD

## 2018-05-10 ENCOUNTER — Ambulatory Visit: Payer: BLUE CROSS/BLUE SHIELD | Admitting: Podiatry

## 2018-05-10 ENCOUNTER — Other Ambulatory Visit: Payer: Self-pay | Admitting: Podiatry

## 2018-05-10 ENCOUNTER — Encounter: Payer: Self-pay | Admitting: Podiatry

## 2018-05-10 ENCOUNTER — Ambulatory Visit (INDEPENDENT_AMBULATORY_CARE_PROVIDER_SITE_OTHER): Payer: BLUE CROSS/BLUE SHIELD

## 2018-05-10 DIAGNOSIS — M2042 Other hammer toe(s) (acquired), left foot: Secondary | ICD-10-CM | POA: Diagnosis not present

## 2018-05-10 DIAGNOSIS — M2041 Other hammer toe(s) (acquired), right foot: Secondary | ICD-10-CM

## 2018-05-10 DIAGNOSIS — M79671 Pain in right foot: Secondary | ICD-10-CM

## 2018-05-10 NOTE — Progress Notes (Signed)
Subjective:    Patient ID: Robin [name], female    DOB: Jul 21, 1971, 47 y.o.   MRN: [id number]  HPI 47 year old female presents the office today for concerns of pain to her right second toe showed to have the toe fixed.  The toe has been rubbing some of her shoes causing a lot of pain.  She has tried changing shoes and is significant improvement.  She has a history of bunion surgery over 15 years ago.  She states that the toe burn at times she walks.  No recent injury to the area no significant swelling.  No tingling.  She has no other concerns.   Review of Systems  All other systems reviewed and are negative.  Past Medical History:  Diagnosis Date  . Allergy   . Anxiety   . Asthma   . GERD (gastroesophageal reflux disease)   . Hyperlipidemia   . Hypertension   . OSA (obstructive sleep apnea)     Past Surgical History:  Procedure Laterality Date  . ABDOMINAL HYSTERECTOMY    . FOOT SURGERY Bilateral    Both surgeries for bunions by Dr. Wynelle Cleveland per pt     Current Outpatient Medications:  .  amLODipine (NORVASC) 5 MG tablet, , Disp: , Rfl: 2 .  ATORVASTATIN CALCIUM PO, Take 1 tablet by mouth daily., Disp: , Rfl:  .  buPROPion (WELLBUTRIN XL) 300 MG 24 hr tablet, Take 300 mg by mouth daily., Disp: , Rfl:  .  hydrochlorothiazide (HYDRODIURIL) 25 MG tablet, Take 25 mg by mouth daily., Disp: , Rfl:  .  naproxen (NAPROSYN) 500 MG tablet, Take 500 mg by mouth 2 (two) times daily with a meal., Disp: , Rfl:  .  omeprazole (PRILOSEC OTC) 20 MG tablet, Take 20 mg by mouth daily as needed (acid reflux). , Disp: , Rfl:   Current Facility-Administered Medications:  .  triamcinolone acetonide (KENALOG) 10 MG/ML injection 10 mg, 10 mg, Other, Once, Alvan Dame, DPM  Allergies  Allergen Reactions  . Lisinopril Cough         Objective:   Physical Exam  General: AAO x3, NAD  Dermatological: Scar from the prior surgery is well-healed.  Skin is warm, dry and supple  bilateral. Nails x 10 are well manicured; remaining integument appears unremarkable at this time. There are no open sores, no preulcerative lesions, no rash or signs of infection present.  Vascular: Dorsalis Pedis artery and Posterior Tibial artery pedal pulses are 2/4 bilateral with immedate capillary fill time. There is no pain with calf compression, swelling, warmth, erythema.   Neruologic: Grossly intact via light touch bilateral. Protective threshold with Semmes Wienstein monofilament intact to all pedal sites bilateral.   Musculoskeletal: Hammertoe deformities present right second toe.  Nonweightbearing exam shows contracture of the PIPJ and she states this is where the redness of her shoes however upon weightbearing evaluation that point demonstrate now she has more of a mallet toe.  There is tenderness about the DIPJ and PIPJ of the right second toe.  Muscular strength 5/5 in all groups tested bilateral.  Gait: Unassisted, Nonantalgic.       Assessment & Plan:  47 year old female with symptomatic right second hammertoe deformity -Treatment options discussed including all alternatives, risks, and complications -Etiology of symptoms were discussed -X-rays were obtained and reviewed with the patient. There is to have more contracture of the DIPJ on x-ray.  There is no evidence of acute fracture or stress fracture. -We discussed with conservative as well  as surgical treatment options.  At this point given her ongoing pain despite change in shoes she wishes to proceed with surgery.  We discussed with her hammertoe repair of the right second toe.  Likely arthrodesis of the second PIPJ arthroplasty of the DIPJ. -The incision placement as well as the postoperative course was discussed with the patient. I discussed risks of the surgery which include, but not limited to, infection, bleeding, pain, swelling, need for further surgery, delayed or nonhealing, painful or ugly scar, numbness or sensation  changes, over/under correction, recurrence, transfer lesions, further deformity, hardware failure, DVT/PE, loss of toe/foot. Patient understands these risks and wishes to proceed with surgery. The surgical consent was reviewed with the patient all 3 pages were signed. No promises or guarantees were given to the outcome of the procedure. All questions were answered to the best of my ability. Before the surgery the patient was encouraged to call the office if there is any further questions. The surgery will be performed at the [facility] on an outpatient basis. -Surgical shoe was dispensed for postoperative course  Vivi Barrack DPM

## 2018-05-10 NOTE — Patient Instructions (Signed)

## 2018-05-29 ENCOUNTER — Encounter: Payer: Self-pay | Admitting: Podiatry

## 2018-06-05 ENCOUNTER — Other Ambulatory Visit: Payer: Self-pay | Admitting: Podiatry

## 2018-06-05 ENCOUNTER — Encounter: Payer: Self-pay | Admitting: Podiatry

## 2018-06-05 ENCOUNTER — Telehealth: Payer: Self-pay | Admitting: *Deleted

## 2018-06-05 DIAGNOSIS — E7800 Pure hypercholesterolemia, unspecified: Secondary | ICD-10-CM | POA: Diagnosis not present

## 2018-06-05 DIAGNOSIS — M2041 Other hammer toe(s) (acquired), right foot: Secondary | ICD-10-CM | POA: Diagnosis not present

## 2018-06-05 MED ORDER — PROMETHAZINE HCL 25 MG PO TABS: 25.0000 mg | ORAL_TABLET | Freq: Three times a day (TID) | ORAL | 0 refills | Status: DC | PRN

## 2018-06-05 MED ORDER — HYDROCODONE-ACETAMINOPHEN 5-325 MG PO TABS: 2.0000 | ORAL_TABLET | Freq: Four times a day (QID) | ORAL | 0 refills | Status: DC | PRN

## 2018-06-05 MED ORDER — CEPHALEXIN 500 MG PO CAPS: 500.0000 mg | ORAL_CAPSULE | Freq: Four times a day (QID) | ORAL | 0 refills | Status: DC

## 2018-06-05 MED ORDER — CEPHALEXIN 500 MG PO CAPS: 500.0000 mg | ORAL_CAPSULE | Freq: Three times a day (TID) | ORAL | 0 refills | Status: DC

## 2018-06-05 NOTE — Progress Notes (Signed)
Orders for promethazine and cephalexin called to the [facility].

## 2018-06-05 NOTE — Addendum Note (Signed)
Addended by: Alphia Kava D on: 06/05/2018 08:58 AM   Modules accepted: Orders

## 2018-06-05 NOTE — Addendum Note (Signed)
Addended by: Alphia Kava D on: 06/05/2018 09:10 AM   Modules accepted: Orders

## 2018-06-05 NOTE — Addendum Note (Signed)
Addended by: Alphia Kava D on: 06/05/2018 09:09 AM   Modules accepted: Orders

## 2018-06-05 NOTE — Progress Notes (Signed)
Orders called to the [facility] for promethazine and cephalexin.

## 2018-06-05 NOTE — Progress Notes (Signed)
Reset pain medication

## 2018-06-05 NOTE — Progress Notes (Signed)
Postop medications sent 

## 2018-06-05 NOTE — Addendum Note (Signed)
Addended by: Alphia Kava D on: 06/05/2018 08:57 AM   Modules accepted: Orders

## 2018-06-05 NOTE — Telephone Encounter (Signed)
Pt called to inform Dr. Ardelle Anton the pain medication was not at the pharmacy. I told pt I had spoken with [facility] pharmacy and was informed they did not have escribe capability at this time. I told pt Dr. Ardelle Anton had tried to escribe 3 times and I could not escribe a narcotic but could handwrite the rx for the pain medication and she could have someone pick up the rx in the [place] office. Pt states she will have her husband pick it up.

## 2018-06-06 ENCOUNTER — Telehealth: Payer: Self-pay | Admitting: *Deleted

## 2018-06-06 NOTE — Telephone Encounter (Signed)
Called and left a message for the patient to call the [place] office at [phone] and I was calling to check on the patient to see how she was doing after surgery and stated to call but I would not be here tomorrow. Misty Stanley

## 2018-06-10 ENCOUNTER — Ambulatory Visit (INDEPENDENT_AMBULATORY_CARE_PROVIDER_SITE_OTHER): Payer: BLUE CROSS/BLUE SHIELD

## 2018-06-10 ENCOUNTER — Encounter: Payer: Self-pay | Admitting: Podiatry

## 2018-06-10 ENCOUNTER — Ambulatory Visit (INDEPENDENT_AMBULATORY_CARE_PROVIDER_SITE_OTHER): Payer: BLUE CROSS/BLUE SHIELD | Admitting: Podiatry

## 2018-06-10 VITALS — Temp 97.6°F

## 2018-06-10 DIAGNOSIS — Z9889 Other specified postprocedural states: Secondary | ICD-10-CM

## 2018-06-10 DIAGNOSIS — M2041 Other hammer toe(s) (acquired), right foot: Secondary | ICD-10-CM

## 2018-06-10 MED ORDER — HYDROCODONE-ACETAMINOPHEN 5-325 MG PO TABS: 1.0000 | ORAL_TABLET | Freq: Four times a day (QID) | ORAL | 0 refills | Status: DC | PRN

## 2018-06-11 NOTE — Progress Notes (Signed)
Subjective: Robin [name] is a 47 y.o. is seen today in office s/p right 2nd digit hammertoe repair preformed on 06/03/2018 Her pain she states is improving.  She is asking for refill for pain medication however.  She is been taking 2 pills every 10 hours denies any systemic complaints such as fevers, chills, nausea, vomiting. No calf pain, chest pain, shortness of breath.   Objective: General: No acute distress, AAOx3  DP/PT pulses palpable 2/4, CRT < 3 sec to all digits.  Protective sensation intact. Motor function intact.  RIGHT foot: Incision is well coapted without any evidence of dehiscence and sutures are intact.  K wire also reports that any drainage or pus.  The toes and rectus position. There is no surrounding erythema, ascending cellulitis, fluctuance, crepitus, malodor, drainage/purulence. There is mild edema around the surgical site. There is mild pain along the surgical site.  Decreased range of motion first MPJ. No other areas of tenderness to bilateral lower extremities.  No other open lesions or pre-ulcerative lesions.  No pain with calf compression, swelling, warmth, erythema.   Assessment and Plan:  Status post right second toe hammertoe repair, doing well with no complications   -Treatment options discussed including all alternatives, risks, and complications -X-rays were obtained reviewed.  Status post hammertoe repair with K wire fixation intact.  No evidence of acute fracture.  Arthritic changes present the first MPJ. -Antibiotic ointment and a DSD was applied. Keep dressing clean, dry, intact -Remain in surgical shoe.  -Ice/elevation -Pain medication as needed- Refilled Vicodin today.  -Monitor for any clinical signs or symptoms of infection and DVT/PE and directed to call the office immediately should any occur or go to the ER. -Follow-up as scheduled or sooner if any problems arise. In the meantime, encouraged to call the office with any questions, concerns,  change in symptoms.   Ovid Curd, DPM

## 2018-06-17 ENCOUNTER — Encounter: Payer: BLUE CROSS/BLUE SHIELD | Admitting: Podiatry

## 2018-06-21 ENCOUNTER — Ambulatory Visit (INDEPENDENT_AMBULATORY_CARE_PROVIDER_SITE_OTHER): Payer: BLUE CROSS/BLUE SHIELD | Admitting: Podiatry

## 2018-06-21 DIAGNOSIS — Z9889 Other specified postprocedural states: Secondary | ICD-10-CM

## 2018-06-21 DIAGNOSIS — M2041 Other hammer toe(s) (acquired), right foot: Secondary | ICD-10-CM

## 2018-06-22 NOTE — Progress Notes (Signed)
Subjective: Robin [name] is a 47 y.o. is seen today in office s/p right 2nd digit hammertoe repair preformed on 06/03/2018.  She states that she is doing well.  She has not taken any narcotic pain medicine she only takes anti-inflammatories as needed.  She is to remain in surgical shoe.  She denies any recent injury or falls.  Overall she states that she is doing well and she denies any systemic complaints including fevers, chills, nausea, vomiting.  Denies any calf pain, chest pain, shortness of breath.  No other concerns today.  Objective: General: No acute distress, AAOx3  DP/PT pulses palpable 2/4, CRT < 3 sec to all digits.  Protective sensation intact. Motor function intact.  RIGHT foot: Incision is well coapted without any evidence of dehiscence and sutures are intact.  K wire also reports that any drainage or pus.  The toes and rectus position.  There is mild edema to the toe.  There is some mild hyperpigmentation along the MPJ however this is more from where it is slightly swollen.  There is no erythema or increase in warmth.  There is adequate temperature to the toe and the foot and there is no increase in warmth or signs of infection.  There is normal capillary refill time to the distal portion of the toe as well as the plantar aspect. No other open lesions or pre-ulcerative lesions.  No pain with calf compression, swelling, warmth, erythema.   Assessment and Plan:  Status post right second toe hammertoe repair, doing well with no complications   -Treatment options discussed including all alternatives, risks, and complications -She presents today for possible suture removal.  I did remove a couple of sutures today but there is still some slight motion across the incision site left the remainder of the sutures intact.  Antibiotic ointment was applied followed by a bandage.  Keep the dressing clean, dry, intact however if she did need to change the bandage she can do so with antibiotic  ointment and a dressing. -Remain in surgical shoe.  Continue to ice and elevate.  Pain medication as needed but she has not been requiring this. -Monitor for any clinical signs or symptoms of infection and directed to call the office immediately should any occur or go to the ER.  Return in about 10 days (around 07/01/2018).  Vivi Barrack DPM

## 2018-07-01 ENCOUNTER — Encounter: Payer: Self-pay | Admitting: Podiatry

## 2018-07-01 ENCOUNTER — Ambulatory Visit (INDEPENDENT_AMBULATORY_CARE_PROVIDER_SITE_OTHER): Payer: Self-pay | Admitting: Podiatry

## 2018-07-01 DIAGNOSIS — M2041 Other hammer toe(s) (acquired), right foot: Secondary | ICD-10-CM

## 2018-07-01 DIAGNOSIS — Z9889 Other specified postprocedural states: Secondary | ICD-10-CM

## 2018-07-01 DIAGNOSIS — M25512 Pain in left shoulder: Secondary | ICD-10-CM | POA: Diagnosis not present

## 2018-07-01 DIAGNOSIS — Z79899 Other long term (current) drug therapy: Secondary | ICD-10-CM | POA: Diagnosis not present

## 2018-07-01 DIAGNOSIS — M47812 Spondylosis without myelopathy or radiculopathy, cervical region: Secondary | ICD-10-CM | POA: Diagnosis not present

## 2018-07-02 DIAGNOSIS — M2041 Other hammer toe(s) (acquired), right foot: Secondary | ICD-10-CM | POA: Insufficient documentation

## 2018-07-02 NOTE — Progress Notes (Signed)
Subjective: Robin [name] is a 47 y.o. is seen today in office s/p right 2nd digit hammertoe repair preformed on 06/03/2018.  She presents to remove the remainder of the sutures.  She states that she is doing well and her pain is improved.  She is wearing the surgical shoe.  She has no other complaints today. Denies any systemic complaints including fevers, chills, nausea, vomiting.  Denies any calf pain, chest pain, shortness of breath.  No other concerns today.  Objective: General: No acute distress, AAOx3  DP/PT pulses palpable 2/4, CRT < 3 sec to all digits.  Protective sensation intact. Motor function intact.  RIGHT foot: Incision is well coapted without any evidence of dehiscence and half of the sutures are intact.  K wire also intact without any drainage or pus.  The toe is in a rectus position.  There is mild edema to the toe.  There is some mild hyperpigmentation along the MPJ however this is more from where it is slightly swollen however this has improved. There is great CRT to the toe.There is no erythema or increase in warmth.   No other open lesions or pre-ulcerative lesions.  No pain with calf compression, swelling, warmth, erythema.   Assessment and Plan:  Status post right second toe hammertoe repair, doing well with no complications   -Treatment options discussed including all alternatives, risks, and complications -The remainder of the sutures were removed today without any complications.  Steri-Strips were applied for reinforcement followed by antibiotic ointment and a bandage.  She can change the bandage every couple of days as needed but hold off on getting the area wet for now.  Continue with surgical shoe, ice elevation. -Monitor for any clinical signs or symptoms of infection and directed to call the office immediately should any occur or go to the ER.  RTC 2 week or sooner if needed. X-ray next appointment and likely pin removal  Vivi Barrack DPM

## 2018-07-10 ENCOUNTER — Ambulatory Visit: Payer: BLUE CROSS/BLUE SHIELD | Admitting: Physical Therapy

## 2018-07-16 ENCOUNTER — Encounter: Payer: Self-pay | Admitting: Podiatry

## 2018-07-16 ENCOUNTER — Ambulatory Visit (INDEPENDENT_AMBULATORY_CARE_PROVIDER_SITE_OTHER): Payer: BLUE CROSS/BLUE SHIELD | Admitting: Podiatry

## 2018-07-16 ENCOUNTER — Other Ambulatory Visit: Payer: Self-pay

## 2018-07-16 ENCOUNTER — Ambulatory Visit (INDEPENDENT_AMBULATORY_CARE_PROVIDER_SITE_OTHER): Payer: BLUE CROSS/BLUE SHIELD

## 2018-07-16 DIAGNOSIS — M2041 Other hammer toe(s) (acquired), right foot: Secondary | ICD-10-CM

## 2018-07-16 DIAGNOSIS — Z9889 Other specified postprocedural states: Secondary | ICD-10-CM

## 2018-07-16 NOTE — Progress Notes (Signed)
Subjective: Robin [name] is a 48 y.o. is seen today in office s/p right 2nd digit hammertoe repair preformed on 06/03/2018.  She presented for pin removal.  She said that she is very eager to get the pin out as well as not obviously been a surgical shoe.  She is having no pain she is not taking any pain medication.  She denies any systemic complaints including fevers, chills, nausea, vomiting.  Denies any calf pain, chest pain, shortness of breath.  No other concerns today.  Objective: General: No acute distress, AAOx3  DP/PT pulses palpable 2/4, CRT < 3 sec to all digits.  Protective sensation intact. Motor function intact.  RIGHT foot: Incision is well coapted without any evidence of dehiscence and half of the sutures are intact.  K wire also intact without any drainage or pus.  The toe is in a rectus position.  There is mild edema to the toe however this is improved.  There is some mild hyperpigmentation along the MPJ however this is also continuing to improve.  Small scab is present with prior incisions but they appear to be well-healed.   No other open lesions or pre-ulcerative lesions.  No pain with calf compression, swelling, warmth, erythema.   Assessment and Plan:  Status post right second toe hammertoe repair, doing well with no complications   -Treatment options discussed including all alternatives, risks, and complications -X-rays were obtained and reviewed.  Hardware intact. -The pin site was cleaned with Betadine it was removed in total without any complications.  Areas cleaned and antibiotic ointment and a bandage was applied.  On her continue with a similar dressing.  She can start showering the area wet tomorrow once the pin site closes.  Keep antibiotic ointment and a bandage on and I showed her how to tape the toe to help with swelling.  Over the next week she can slowly start to transition to regular shoe as she is able to provide this a gradual transition.  Continue to ice  and elevate as well especially as she is transitioning and she is back to work. -Monitor for any clinical signs or symptoms of infection and directed to call the office immediately should any occur or go to the ER.  Return in about 1 month (around 08/13/2018).  Vivi Barrack DPM

## 2018-08-15 ENCOUNTER — Encounter: Payer: BLUE CROSS/BLUE SHIELD | Admitting: Podiatry

## 2018-08-29 ENCOUNTER — Encounter: Payer: BLUE CROSS/BLUE SHIELD | Admitting: Podiatry

## 2018-08-31 DIAGNOSIS — R3989 Other symptoms and signs involving the genitourinary system: Secondary | ICD-10-CM | POA: Diagnosis not present

## 2018-08-31 DIAGNOSIS — R350 Frequency of micturition: Secondary | ICD-10-CM | POA: Diagnosis not present

## 2018-10-18 DIAGNOSIS — M170 Bilateral primary osteoarthritis of knee: Secondary | ICD-10-CM | POA: Insufficient documentation

## 2018-10-18 DIAGNOSIS — M1711 Unilateral primary osteoarthritis, right knee: Secondary | ICD-10-CM | POA: Insufficient documentation

## 2018-10-18 DIAGNOSIS — E669 Obesity, unspecified: Secondary | ICD-10-CM | POA: Insufficient documentation

## 2018-10-18 DIAGNOSIS — E66812 Obesity, class 2: Secondary | ICD-10-CM | POA: Insufficient documentation

## 2018-10-18 DIAGNOSIS — M25561 Pain in right knee: Secondary | ICD-10-CM | POA: Diagnosis not present

## 2018-10-18 DIAGNOSIS — E661 Drug-induced obesity: Secondary | ICD-10-CM | POA: Insufficient documentation

## 2019-01-16 DIAGNOSIS — I10 Essential (primary) hypertension: Secondary | ICD-10-CM | POA: Diagnosis not present

## 2019-01-16 DIAGNOSIS — R739 Hyperglycemia, unspecified: Secondary | ICD-10-CM | POA: Diagnosis not present

## 2019-01-16 DIAGNOSIS — F3341 Major depressive disorder, recurrent, in partial remission: Secondary | ICD-10-CM | POA: Diagnosis not present

## 2019-01-26 DIAGNOSIS — M940 Chondrocostal junction syndrome [Tietze]: Secondary | ICD-10-CM | POA: Diagnosis not present

## 2019-01-26 DIAGNOSIS — B349 Viral infection, unspecified: Secondary | ICD-10-CM | POA: Diagnosis not present

## 2019-01-29 ENCOUNTER — Other Ambulatory Visit: Payer: Self-pay | Admitting: Internal Medicine

## 2019-01-29 DIAGNOSIS — K529 Noninfective gastroenteritis and colitis, unspecified: Secondary | ICD-10-CM | POA: Diagnosis not present

## 2019-01-29 DIAGNOSIS — R109 Unspecified abdominal pain: Secondary | ICD-10-CM | POA: Diagnosis not present

## 2019-01-29 DIAGNOSIS — I10 Essential (primary) hypertension: Secondary | ICD-10-CM | POA: Diagnosis not present

## 2019-02-03 ENCOUNTER — Ambulatory Visit
Admission: RE | Admit: 2019-02-03 | Discharge: 2019-02-03 | Disposition: A | Discharge status: Home / Self Care | Payer: BLUE CROSS/BLUE SHIELD | Source: Ambulatory Visit | Attending: Internal Medicine | Admitting: Internal Medicine

## 2019-02-03 DIAGNOSIS — R109 Unspecified abdominal pain: Secondary | ICD-10-CM

## 2019-02-03 MED ORDER — IOPAMIDOL (ISOVUE-300) INJECTION 61%
125.0000 mL | Freq: Once | INTRAVENOUS | Status: AC | PRN
  Administered 2019-02-03: 125 mL via INTRAVENOUS

## 2019-02-03 MED ORDER — IOPAMIDOL (ISOVUE-300) INJECTION 61%: 100.0000 mL | Freq: Once | INTRAVENOUS | Status: DC | PRN

## 2019-06-06 DIAGNOSIS — L304 Erythema intertrigo: Secondary | ICD-10-CM | POA: Diagnosis not present

## 2019-07-16 DIAGNOSIS — M542 Cervicalgia: Secondary | ICD-10-CM | POA: Insufficient documentation

## 2019-07-16 DIAGNOSIS — M25561 Pain in right knee: Secondary | ICD-10-CM | POA: Diagnosis not present

## 2019-07-16 DIAGNOSIS — M47812 Spondylosis without myelopathy or radiculopathy, cervical region: Secondary | ICD-10-CM | POA: Insufficient documentation

## 2019-07-16 DIAGNOSIS — M1711 Unilateral primary osteoarthritis, right knee: Secondary | ICD-10-CM | POA: Diagnosis not present

## 2019-09-02 ENCOUNTER — Other Ambulatory Visit: Payer: Self-pay | Admitting: Internal Medicine

## 2019-09-02 DIAGNOSIS — Z1231 Encounter for screening mammogram for malignant neoplasm of breast: Secondary | ICD-10-CM

## 2019-09-03 DIAGNOSIS — I10 Essential (primary) hypertension: Secondary | ICD-10-CM | POA: Diagnosis not present

## 2019-09-03 DIAGNOSIS — E785 Hyperlipidemia, unspecified: Secondary | ICD-10-CM | POA: Diagnosis not present

## 2019-09-03 DIAGNOSIS — Z23 Encounter for immunization: Secondary | ICD-10-CM | POA: Diagnosis not present

## 2019-09-03 DIAGNOSIS — Z0000 Encounter for general adult medical examination without abnormal findings: Secondary | ICD-10-CM | POA: Diagnosis not present

## 2019-09-03 DIAGNOSIS — F3341 Major depressive disorder, recurrent, in partial remission: Secondary | ICD-10-CM | POA: Diagnosis not present

## 2019-09-30 DIAGNOSIS — F3341 Major depressive disorder, recurrent, in partial remission: Secondary | ICD-10-CM | POA: Diagnosis not present

## 2019-09-30 DIAGNOSIS — I10 Essential (primary) hypertension: Secondary | ICD-10-CM | POA: Diagnosis not present

## 2019-10-14 DIAGNOSIS — M25512 Pain in left shoulder: Secondary | ICD-10-CM | POA: Diagnosis not present

## 2019-10-14 DIAGNOSIS — M542 Cervicalgia: Secondary | ICD-10-CM | POA: Diagnosis not present

## 2019-10-14 DIAGNOSIS — M47812 Spondylosis without myelopathy or radiculopathy, cervical region: Secondary | ICD-10-CM | POA: Diagnosis not present

## 2019-10-15 ENCOUNTER — Other Ambulatory Visit: Payer: Self-pay

## 2019-10-15 ENCOUNTER — Ambulatory Visit
Admission: RE | Admit: 2019-10-15 | Discharge: 2019-10-15 | Disposition: A | Discharge status: Home / Self Care | Payer: BLUE CROSS/BLUE SHIELD | Source: Ambulatory Visit | Attending: Internal Medicine | Admitting: Internal Medicine

## 2019-10-15 DIAGNOSIS — Z1231 Encounter for screening mammogram for malignant neoplasm of breast: Secondary | ICD-10-CM | POA: Diagnosis not present

## 2019-12-12 DIAGNOSIS — M1990 Unspecified osteoarthritis, unspecified site: Secondary | ICD-10-CM | POA: Diagnosis not present

## 2019-12-12 DIAGNOSIS — F3341 Major depressive disorder, recurrent, in partial remission: Secondary | ICD-10-CM | POA: Diagnosis not present

## 2019-12-12 DIAGNOSIS — M2550 Pain in unspecified joint: Secondary | ICD-10-CM | POA: Diagnosis not present

## 2019-12-23 DIAGNOSIS — M2550 Pain in unspecified joint: Secondary | ICD-10-CM | POA: Diagnosis not present

## 2019-12-23 DIAGNOSIS — M47816 Spondylosis without myelopathy or radiculopathy, lumbar region: Secondary | ICD-10-CM | POA: Diagnosis not present

## 2019-12-23 DIAGNOSIS — M791 Myalgia, unspecified site: Secondary | ICD-10-CM | POA: Diagnosis not present

## 2019-12-23 DIAGNOSIS — M549 Dorsalgia, unspecified: Secondary | ICD-10-CM | POA: Diagnosis not present

## 2019-12-23 DIAGNOSIS — M1990 Unspecified osteoarthritis, unspecified site: Secondary | ICD-10-CM | POA: Diagnosis not present

## 2019-12-23 DIAGNOSIS — M25561 Pain in right knee: Secondary | ICD-10-CM | POA: Diagnosis not present

## 2019-12-23 DIAGNOSIS — M79671 Pain in right foot: Secondary | ICD-10-CM | POA: Diagnosis not present

## 2019-12-23 DIAGNOSIS — M19071 Primary osteoarthritis, right ankle and foot: Secondary | ICD-10-CM | POA: Diagnosis not present

## 2019-12-23 DIAGNOSIS — D869 Sarcoidosis, unspecified: Secondary | ICD-10-CM | POA: Diagnosis not present

## 2019-12-23 DIAGNOSIS — Z862 Personal history of diseases of the blood and blood-forming organs and certain disorders involving the immune mechanism: Secondary | ICD-10-CM | POA: Diagnosis not present

## 2019-12-23 DIAGNOSIS — M1611 Unilateral primary osteoarthritis, right hip: Secondary | ICD-10-CM | POA: Diagnosis not present

## 2019-12-23 DIAGNOSIS — M25542 Pain in joints of left hand: Secondary | ICD-10-CM | POA: Diagnosis not present

## 2019-12-23 DIAGNOSIS — M19072 Primary osteoarthritis, left ankle and foot: Secondary | ICD-10-CM | POA: Diagnosis not present

## 2019-12-23 DIAGNOSIS — M1711 Unilateral primary osteoarthritis, right knee: Secondary | ICD-10-CM | POA: Diagnosis not present

## 2019-12-23 DIAGNOSIS — M25541 Pain in joints of right hand: Secondary | ICD-10-CM | POA: Diagnosis not present

## 2019-12-23 DIAGNOSIS — M25562 Pain in left knee: Secondary | ICD-10-CM | POA: Diagnosis not present

## 2019-12-23 DIAGNOSIS — M79672 Pain in left foot: Secondary | ICD-10-CM | POA: Diagnosis not present

## 2019-12-23 DIAGNOSIS — M79641 Pain in right hand: Secondary | ICD-10-CM | POA: Diagnosis not present

## 2019-12-23 DIAGNOSIS — M1712 Unilateral primary osteoarthritis, left knee: Secondary | ICD-10-CM | POA: Diagnosis not present

## 2019-12-23 DIAGNOSIS — M79642 Pain in left hand: Secondary | ICD-10-CM | POA: Diagnosis not present

## 2020-02-04 DIAGNOSIS — M79643 Pain in unspecified hand: Secondary | ICD-10-CM | POA: Diagnosis not present

## 2020-02-04 DIAGNOSIS — M549 Dorsalgia, unspecified: Secondary | ICD-10-CM | POA: Diagnosis not present

## 2020-02-04 DIAGNOSIS — M1990 Unspecified osteoarthritis, unspecified site: Secondary | ICD-10-CM | POA: Diagnosis not present

## 2020-02-04 DIAGNOSIS — Z862 Personal history of diseases of the blood and blood-forming organs and certain disorders involving the immune mechanism: Secondary | ICD-10-CM | POA: Diagnosis not present

## 2020-03-03 DIAGNOSIS — M25511 Pain in right shoulder: Secondary | ICD-10-CM | POA: Diagnosis not present

## 2020-03-03 DIAGNOSIS — Z862 Personal history of diseases of the blood and blood-forming organs and certain disorders involving the immune mechanism: Secondary | ICD-10-CM | POA: Diagnosis not present

## 2020-03-03 DIAGNOSIS — Z79899 Other long term (current) drug therapy: Secondary | ICD-10-CM | POA: Diagnosis not present

## 2020-03-03 DIAGNOSIS — M1990 Unspecified osteoarthritis, unspecified site: Secondary | ICD-10-CM | POA: Diagnosis not present

## 2020-03-03 DIAGNOSIS — M7581 Other shoulder lesions, right shoulder: Secondary | ICD-10-CM | POA: Diagnosis not present

## 2020-03-24 DIAGNOSIS — M47816 Spondylosis without myelopathy or radiculopathy, lumbar region: Secondary | ICD-10-CM | POA: Insufficient documentation

## 2020-03-24 DIAGNOSIS — M47896 Other spondylosis, lumbar region: Secondary | ICD-10-CM | POA: Diagnosis not present

## 2020-03-24 DIAGNOSIS — M25511 Pain in right shoulder: Secondary | ICD-10-CM | POA: Diagnosis not present

## 2020-03-24 DIAGNOSIS — M25552 Pain in left hip: Secondary | ICD-10-CM | POA: Insufficient documentation

## 2020-03-31 DIAGNOSIS — M1990 Unspecified osteoarthritis, unspecified site: Secondary | ICD-10-CM | POA: Diagnosis not present

## 2020-03-31 DIAGNOSIS — Z79899 Other long term (current) drug therapy: Secondary | ICD-10-CM | POA: Diagnosis not present

## 2020-04-03 DIAGNOSIS — M25511 Pain in right shoulder: Secondary | ICD-10-CM | POA: Diagnosis not present

## 2020-04-09 DIAGNOSIS — M25511 Pain in right shoulder: Secondary | ICD-10-CM | POA: Diagnosis not present

## 2020-04-09 DIAGNOSIS — M25561 Pain in right knee: Secondary | ICD-10-CM | POA: Diagnosis not present

## 2020-04-14 DIAGNOSIS — L811 Chloasma: Secondary | ICD-10-CM | POA: Diagnosis not present

## 2020-04-21 DIAGNOSIS — R739 Hyperglycemia, unspecified: Secondary | ICD-10-CM | POA: Diagnosis not present

## 2020-04-21 DIAGNOSIS — M75101 Unspecified rotator cuff tear or rupture of right shoulder, not specified as traumatic: Secondary | ICD-10-CM | POA: Diagnosis not present

## 2020-04-21 DIAGNOSIS — I10 Essential (primary) hypertension: Secondary | ICD-10-CM | POA: Diagnosis not present

## 2020-04-21 DIAGNOSIS — F3341 Major depressive disorder, recurrent, in partial remission: Secondary | ICD-10-CM | POA: Diagnosis not present

## 2020-05-05 DIAGNOSIS — M7581 Other shoulder lesions, right shoulder: Secondary | ICD-10-CM | POA: Diagnosis not present

## 2020-05-05 DIAGNOSIS — M0609 Rheumatoid arthritis without rheumatoid factor, multiple sites: Secondary | ICD-10-CM | POA: Diagnosis not present

## 2020-05-05 DIAGNOSIS — Z79899 Other long term (current) drug therapy: Secondary | ICD-10-CM | POA: Diagnosis not present

## 2020-05-05 DIAGNOSIS — M25511 Pain in right shoulder: Secondary | ICD-10-CM | POA: Diagnosis not present

## 2020-05-07 DIAGNOSIS — M25561 Pain in right knee: Secondary | ICD-10-CM | POA: Diagnosis not present

## 2020-05-07 DIAGNOSIS — M25552 Pain in left hip: Secondary | ICD-10-CM | POA: Diagnosis not present

## 2020-05-10 DIAGNOSIS — M5416 Radiculopathy, lumbar region: Secondary | ICD-10-CM | POA: Diagnosis not present

## 2020-05-10 DIAGNOSIS — D869 Sarcoidosis, unspecified: Secondary | ICD-10-CM | POA: Insufficient documentation

## 2020-05-10 DIAGNOSIS — M25552 Pain in left hip: Secondary | ICD-10-CM | POA: Diagnosis not present

## 2020-05-10 DIAGNOSIS — M545 Low back pain: Secondary | ICD-10-CM | POA: Diagnosis not present

## 2020-05-14 DIAGNOSIS — M5136 Other intervertebral disc degeneration, lumbar region: Secondary | ICD-10-CM | POA: Insufficient documentation

## 2020-05-20 DIAGNOSIS — M5136 Other intervertebral disc degeneration, lumbar region: Secondary | ICD-10-CM | POA: Diagnosis not present

## 2020-08-26 DIAGNOSIS — Z79899 Other long term (current) drug therapy: Secondary | ICD-10-CM | POA: Diagnosis not present

## 2020-08-26 DIAGNOSIS — M0609 Rheumatoid arthritis without rheumatoid factor, multiple sites: Secondary | ICD-10-CM | POA: Diagnosis not present

## 2020-08-26 DIAGNOSIS — M7581 Other shoulder lesions, right shoulder: Secondary | ICD-10-CM | POA: Diagnosis not present

## 2020-08-26 DIAGNOSIS — M25511 Pain in right shoulder: Secondary | ICD-10-CM | POA: Diagnosis not present

## 2020-08-27 DIAGNOSIS — M25552 Pain in left hip: Secondary | ICD-10-CM | POA: Diagnosis not present

## 2020-08-27 DIAGNOSIS — M1711 Unilateral primary osteoarthritis, right knee: Secondary | ICD-10-CM | POA: Diagnosis not present

## 2020-09-20 ENCOUNTER — Other Ambulatory Visit: Payer: Self-pay | Admitting: Internal Medicine

## 2020-09-20 DIAGNOSIS — Z1231 Encounter for screening mammogram for malignant neoplasm of breast: Secondary | ICD-10-CM

## 2020-10-19 ENCOUNTER — Ambulatory Visit: Payer: BC Managed Care – PPO

## 2020-10-22 ENCOUNTER — Other Ambulatory Visit: Payer: Self-pay

## 2020-10-22 ENCOUNTER — Ambulatory Visit: Payer: BLUE CROSS/BLUE SHIELD | Admitting: Podiatry

## 2020-10-22 DIAGNOSIS — L603 Nail dystrophy: Secondary | ICD-10-CM

## 2020-10-22 DIAGNOSIS — B351 Tinea unguium: Secondary | ICD-10-CM | POA: Diagnosis not present

## 2020-10-24 ENCOUNTER — Encounter: Payer: Self-pay | Admitting: Podiatry

## 2020-10-24 NOTE — Progress Notes (Signed)
  Subjective:  Patient ID: Robin [name], female    DOB: August 14, 1971,  MRN: [id number]  Chief Complaint  Patient presents with  . Nail Problem    Pt states Right 1st medial border possible ingrown nail, tender 6 month duration, denies any drainage.    49 y.o. female presents with the above complaint. History confirmed with patient.   Objective:  Physical Exam: warm, good capillary refill, no trophic changes or ulcerative lesions, normal DP and PT pulses and normal sensory exam.There is thickening of the right hallux nail with discoloration and subungual debris  Assessment:   1. Onychodystrophy      Plan:  Patient was evaluated and treated and all questions answered.  Discussed with her nail dystrophy versus onychomycosis.  Unclear if there is onychomycosis here.  We did a biopsy of the nail plate and sent to [facility] pathology today.  If positive will treat with oral terbinafine, we discussed the benefits and risk of this today versus topical therapy.  If it is nail dystrophy will consider urea gel treatment from [facility].  I will call her after results and decide on treatment options   Return in about 4 months (around 02/21/2021).

## 2020-10-26 DIAGNOSIS — M0609 Rheumatoid arthritis without rheumatoid factor, multiple sites: Secondary | ICD-10-CM | POA: Diagnosis not present

## 2020-10-26 DIAGNOSIS — M25511 Pain in right shoulder: Secondary | ICD-10-CM | POA: Diagnosis not present

## 2020-10-26 DIAGNOSIS — Z79899 Other long term (current) drug therapy: Secondary | ICD-10-CM | POA: Diagnosis not present

## 2020-10-26 DIAGNOSIS — M79645 Pain in left finger(s): Secondary | ICD-10-CM | POA: Diagnosis not present

## 2020-10-26 DIAGNOSIS — M7581 Other shoulder lesions, right shoulder: Secondary | ICD-10-CM | POA: Diagnosis not present

## 2020-11-05 NOTE — Progress Notes (Signed)
I called but couldn't get ahold of her or leave VM. If she calls for results, she has fungus and we had discussed treating with an oral medication. I can e-Rx it for her if she would like to do this (terbinafine AKA Lamisil)

## 2020-11-08 ENCOUNTER — Other Ambulatory Visit: Payer: Self-pay

## 2020-11-08 ENCOUNTER — Ambulatory Visit
Admission: RE | Admit: 2020-11-08 | Discharge: 2020-11-08 | Disposition: A | Discharge status: Home / Self Care | Payer: BC Managed Care – PPO | Source: Ambulatory Visit | Attending: Internal Medicine | Admitting: Internal Medicine

## 2020-11-08 DIAGNOSIS — Z1231 Encounter for screening mammogram for malignant neoplasm of breast: Secondary | ICD-10-CM | POA: Diagnosis not present

## 2020-11-11 DIAGNOSIS — L811 Chloasma: Secondary | ICD-10-CM | POA: Diagnosis not present

## 2020-11-11 DIAGNOSIS — R21 Rash and other nonspecific skin eruption: Secondary | ICD-10-CM | POA: Diagnosis not present

## 2020-11-11 DIAGNOSIS — D485 Neoplasm of uncertain behavior of skin: Secondary | ICD-10-CM | POA: Diagnosis not present

## 2020-11-29 DIAGNOSIS — M25562 Pain in left knee: Secondary | ICD-10-CM | POA: Diagnosis not present

## 2020-11-29 DIAGNOSIS — M1711 Unilateral primary osteoarthritis, right knee: Secondary | ICD-10-CM | POA: Diagnosis not present

## 2020-11-29 DIAGNOSIS — M5136 Other intervertebral disc degeneration, lumbar region: Secondary | ICD-10-CM | POA: Diagnosis not present

## 2020-12-01 ENCOUNTER — Other Ambulatory Visit: Payer: Self-pay | Admitting: Internal Medicine

## 2020-12-01 ENCOUNTER — Ambulatory Visit
Admission: RE | Admit: 2020-12-01 | Discharge: 2020-12-01 | Disposition: A | Discharge status: Home / Self Care | Payer: BC Managed Care – PPO | Source: Ambulatory Visit | Attending: Internal Medicine | Admitting: Internal Medicine

## 2020-12-01 DIAGNOSIS — I10 Essential (primary) hypertension: Secondary | ICD-10-CM | POA: Diagnosis not present

## 2020-12-01 DIAGNOSIS — R0602 Shortness of breath: Secondary | ICD-10-CM | POA: Diagnosis not present

## 2020-12-01 DIAGNOSIS — Z23 Encounter for immunization: Secondary | ICD-10-CM | POA: Diagnosis not present

## 2020-12-01 DIAGNOSIS — D869 Sarcoidosis, unspecified: Secondary | ICD-10-CM | POA: Diagnosis not present

## 2020-12-01 DIAGNOSIS — Z8709 Personal history of other diseases of the respiratory system: Secondary | ICD-10-CM | POA: Diagnosis not present

## 2020-12-01 DIAGNOSIS — E785 Hyperlipidemia, unspecified: Secondary | ICD-10-CM | POA: Diagnosis not present

## 2020-12-01 DIAGNOSIS — Z0000 Encounter for general adult medical examination without abnormal findings: Secondary | ICD-10-CM | POA: Diagnosis not present

## 2020-12-01 DIAGNOSIS — J45990 Exercise induced bronchospasm: Secondary | ICD-10-CM | POA: Diagnosis not present

## 2020-12-09 DIAGNOSIS — M545 Low back pain, unspecified: Secondary | ICD-10-CM | POA: Diagnosis not present

## 2020-12-09 DIAGNOSIS — M4316 Spondylolisthesis, lumbar region: Secondary | ICD-10-CM | POA: Diagnosis not present

## 2020-12-09 DIAGNOSIS — M5136 Other intervertebral disc degeneration, lumbar region: Secondary | ICD-10-CM | POA: Diagnosis not present

## 2020-12-09 DIAGNOSIS — M47896 Other spondylosis, lumbar region: Secondary | ICD-10-CM | POA: Diagnosis not present

## 2020-12-13 IMAGING — MG DIGITAL SCREENING BILAT W/ TOMO W/ CAD
8 of 14 series · 8 of 40 positions shown · non-contrast
Comparison: Previous exam(s).

CLINICAL DATA: Screening.

EXAM:
DIGITAL SCREENING BILATERAL MAMMOGRAM WITH TOMO AND CAD

[L CC synth-2D]
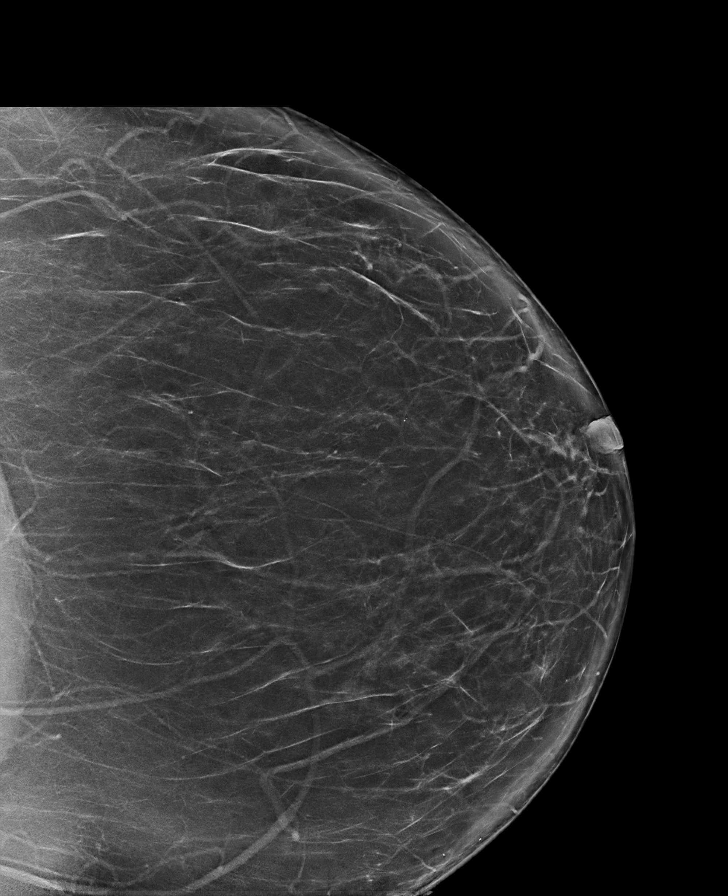

[R MLO synth-2D (1 of 2)]
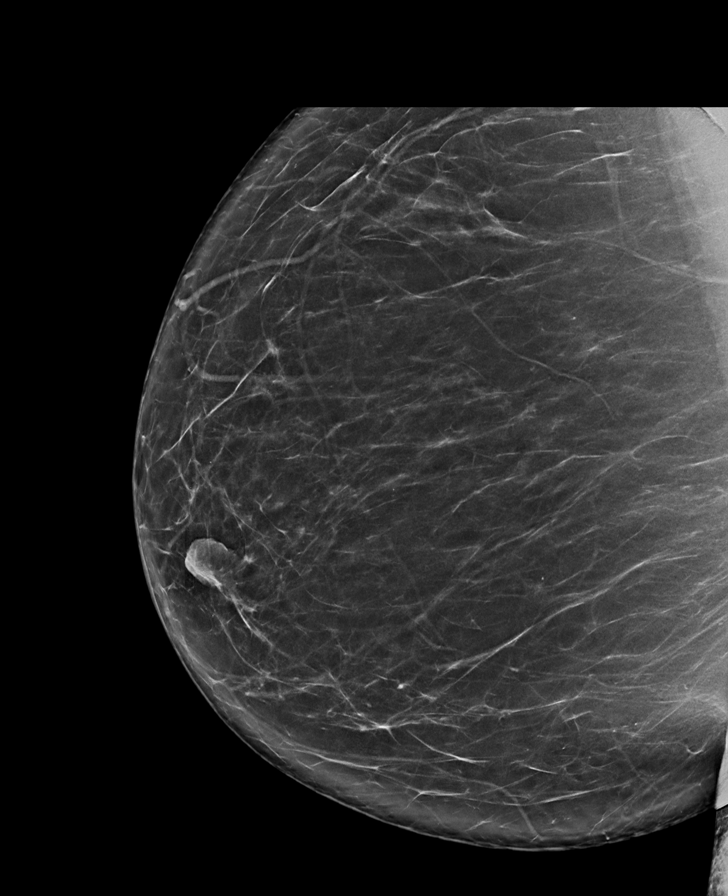

[L MLO synth-2D (1 of 2)]
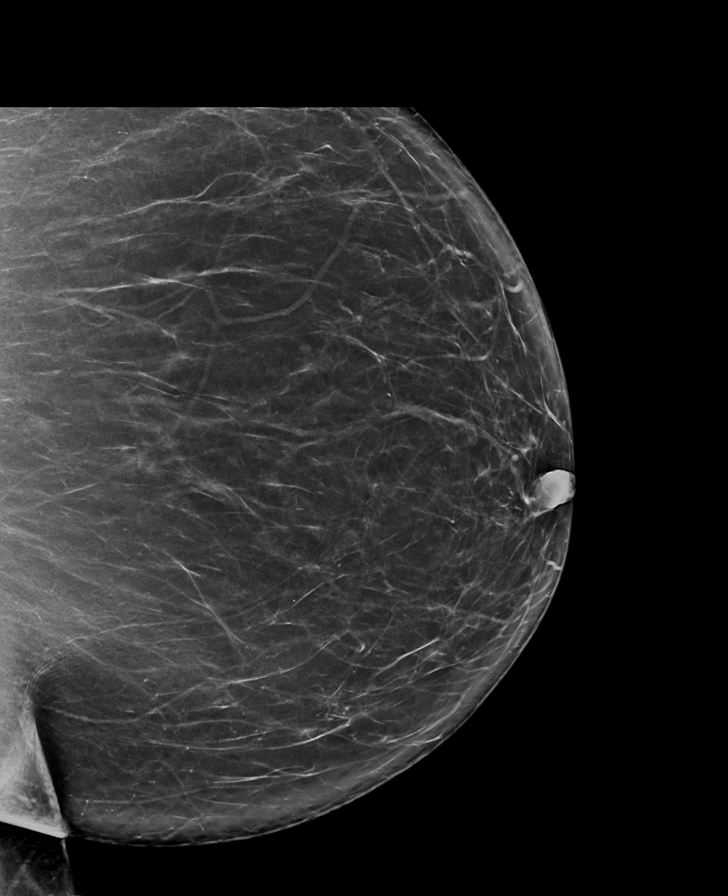

[L CV synth-2D]
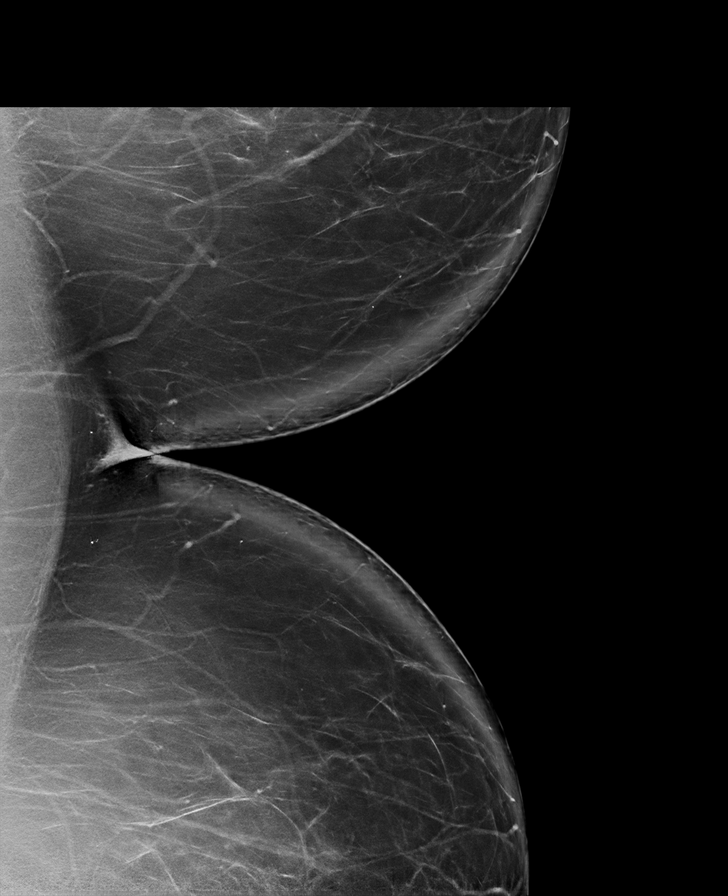

[L MLO synth-2D (2 of 2)]
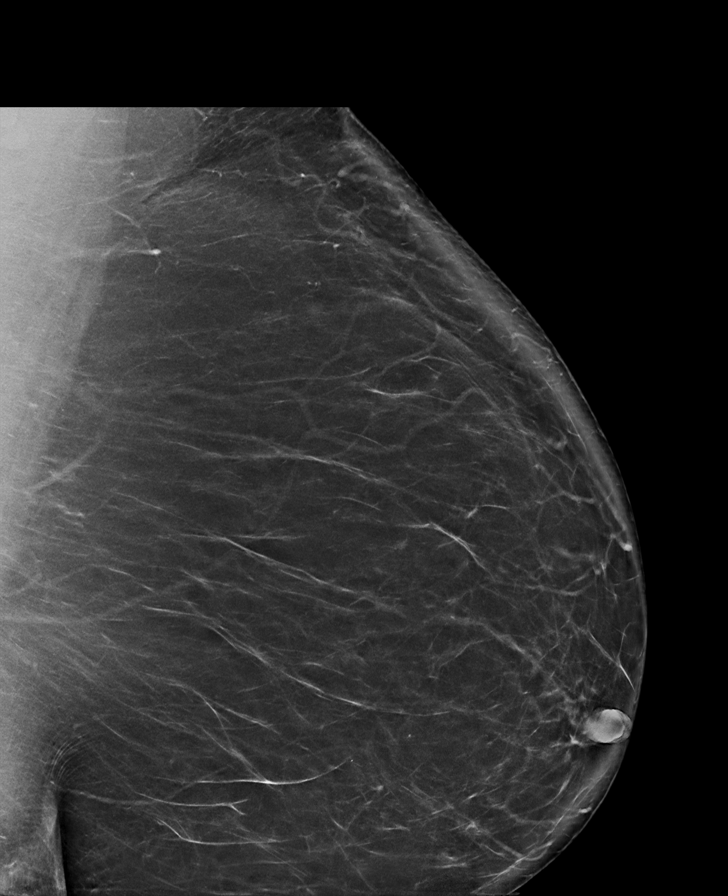

[R MLO synth-2D (2 of 2)]
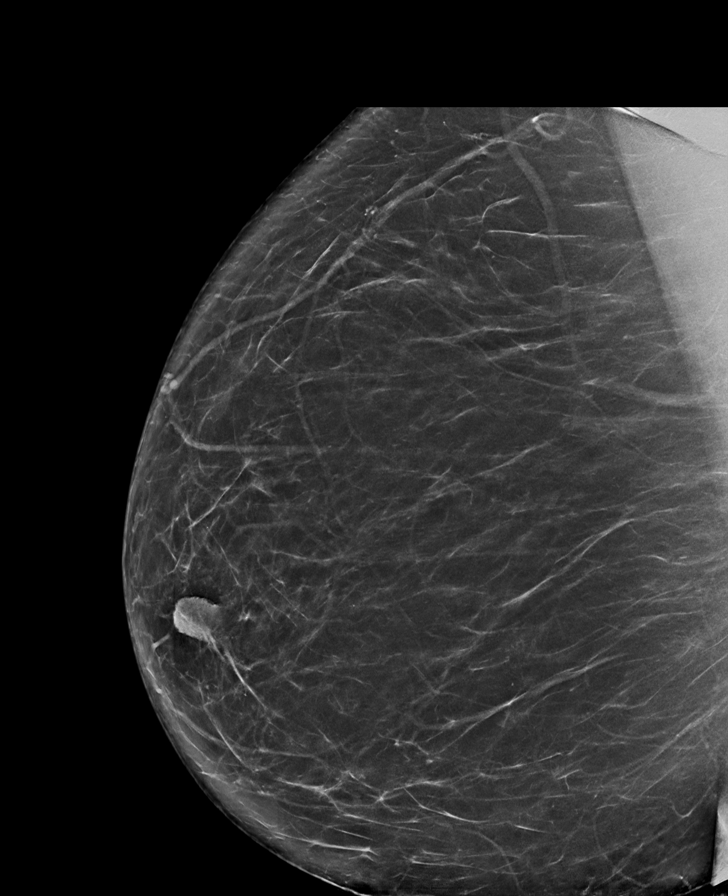

[R CC synth-2D]
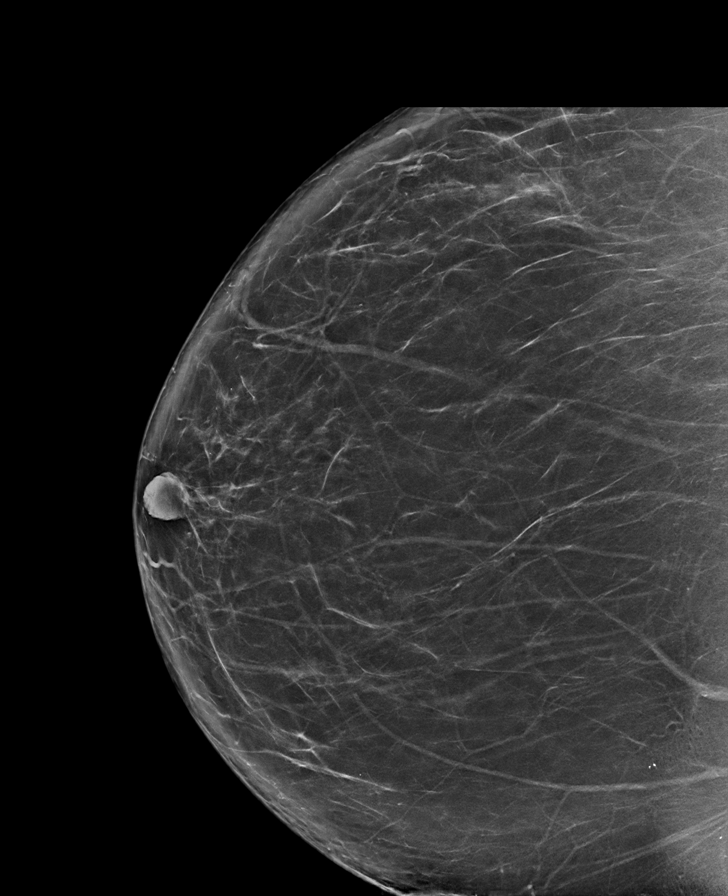

[R MLO tomo · tomo slice 49/97.0]
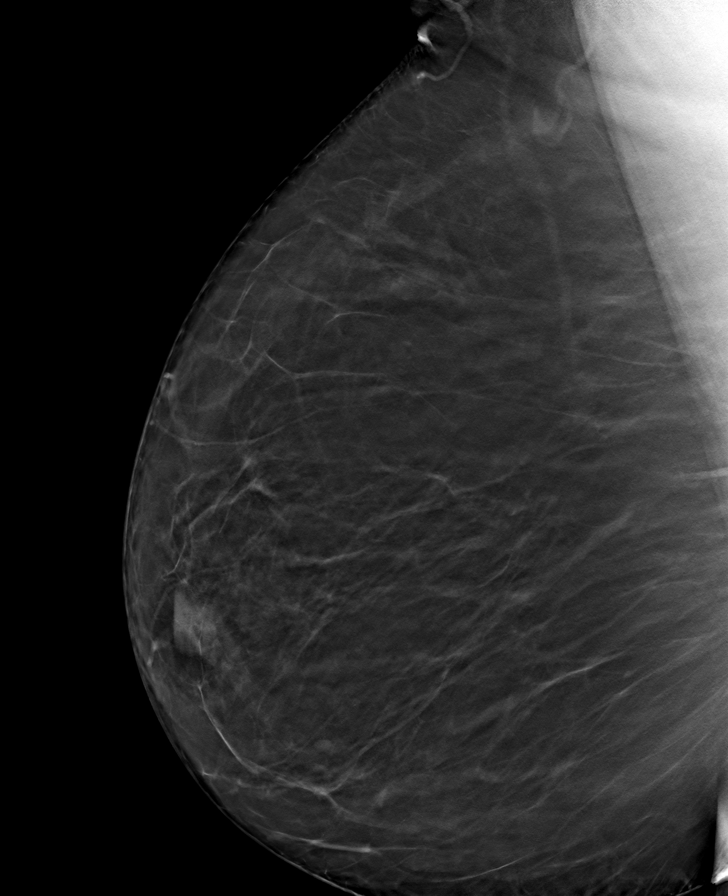

[8 of 40 positions shown; findings below may reference images not displayed]

ACR Breast Density Category b: There are scattered areas of
fibroglandular density.
FINDINGS: There are no findings suspicious for malignancy. Images were
processed with CAD.
IMPRESSION: No mammographic evidence of malignancy. A result letter of this
screening mammogram will be mailed directly to the patient.

RECOMMENDATION:
Screening mammogram in one year. (Code:[id number])

BI-RADS CATEGORY  1: Negative.

## 2020-12-23 DIAGNOSIS — M5136 Other intervertebral disc degeneration, lumbar region: Secondary | ICD-10-CM | POA: Diagnosis not present

## 2021-02-21 ENCOUNTER — Ambulatory Visit: Payer: BC Managed Care – PPO | Admitting: Podiatry

## 2021-04-12 DIAGNOSIS — M545 Low back pain, unspecified: Secondary | ICD-10-CM | POA: Insufficient documentation

## 2021-04-18 DIAGNOSIS — M4150 Other secondary scoliosis, site unspecified: Secondary | ICD-10-CM | POA: Insufficient documentation

## 2021-04-25 ENCOUNTER — Ambulatory Visit: Payer: Self-pay | Admitting: Orthopedic Surgery

## 2021-04-26 ENCOUNTER — Ambulatory Visit: Payer: Self-pay | Admitting: Orthopedic Surgery

## 2021-04-26 NOTE — H&P (Signed)
Subjective:   Robin [name] is a very pleasant 50 year old morbidly obese female with chronic low back pain who presented with acute severe worsening over the last few months. In addition to her back pain she has severe radicular left leg pain. Her pain His ongoing despite appropriate conservative care including gabapentin, Lyrica, narcotic pain medication. After discussing rrisks and benefits and alternatives to suurgical intervention the patient has elected to move forward with surgery. She is scheduled for TLIF L2-3 At [facility] On 05/05/21 With Dr. Rolena Infante.  Patient Active Problem List   Diagnosis Date Noted  . Degeneration of lumbar intervertebral disc 05/14/2020  . Sarcoidosis 05/10/2020  . Lumbar spondylosis 03/24/2020  . Pain of left hip joint 03/24/2020  . Cervical spondylosis without myelopathy 07/16/2019  . Neck pain 07/16/2019  . Obesity 10/18/2018  . Pain in right knee 10/18/2018  . Hammertoe of right foot 07/02/2018  . OSA (obstructive sleep apnea) 10/26/2014   Past Medical History:  Diagnosis Date  . Allergy   . Anxiety   . Asthma   . GERD (gastroesophageal reflux disease)   . Hyperlipidemia   . Hypertension   . OSA (obstructive sleep apnea)     Past Surgical History:  Procedure Laterality Date  . ABDOMINAL HYSTERECTOMY    . FOOT SURGERY Bilateral    Both surgeries for bunions by Dr. Janus Molder per pt    Current Outpatient Medications  Medication Sig Dispense Refill Last Dose  . Adalimumab (HUMIRA PEN) 40 MG/0.8ML PNKT Humira Pen 40 mg/0.8 mL subcutaneous kit     . amLODipine (NORVASC) 5 MG tablet   2   . atorvastatin (LIPITOR) 20 MG tablet Take 20 mg by mouth daily.     . ATORVASTATIN CALCIUM PO Take 1 tablet by mouth daily.     Marland Kitchen buPROPion (WELLBUTRIN XL) 300 MG 24 hr tablet Take 300 mg by mouth daily.     . celecoxib (CELEBREX) 200 MG capsule celecoxib 200 mg capsule     . cephALEXin (KEFLEX) 500 MG capsule Take 1 capsule (500 mg total) by mouth 3 (three) times  daily. 21 capsule 0   . cephALEXin (KEFLEX) 500 MG capsule Take 1 capsule (500 mg total) by mouth 4 (four) times daily. 21 capsule 0   . cyclobenzaprine (FLEXERIL) 10 MG tablet TAKE 1 TABLET BY MOUTH AS NEEDED TWICE DAILY  0   . diclofenac (VOLTAREN) 75 MG EC tablet diclofenac sodium 75 mg tablet,delayed release  TAKE 1 TABLET BY MOUTH TWICE DAILY     . escitalopram (LEXAPRO) 10 MG tablet escitalopram 10 mg tablet  TAKE 1 TABLET BY MOUTH ONCE DAILY FOR 90 DAYS     . gabapentin (NEURONTIN) 300 MG capsule gabapentin 300 mg capsule     . hydrochlorothiazide (HYDRODIURIL) 25 MG tablet Take 25 mg by mouth daily.     Marland Kitchen HYDROcodone-acetaminophen (NORCO/VICODIN) 5-325 MG tablet Take 1-2 tablets by mouth every 6 (six) hours as needed. 20 tablet 0   . hydrocortisone 2.5 % cream hydrocortisone 2.5 % topical cream  APPLY A SMALL AMOUNT TO AFFECTED AREA TWICE A DAY FOR 2 WEEKS     . leflunomide (ARAVA) 20 MG tablet Take 20 mg by mouth daily.     . naproxen (NAPROSYN) 500 MG tablet Take 500 mg by mouth 2 (two) times daily with a meal.     . omeprazole (PRILOSEC OTC) 20 MG tablet Take 20 mg by mouth daily as needed (acid reflux).      . promethazine (PHENERGAN) 25  MG tablet Take 1 tablet (25 mg total) by mouth every 8 (eight) hours as needed for nausea or vomiting. 20 tablet 0   . promethazine (PHENERGAN) 25 MG tablet Take 1 tablet (25 mg total) by mouth every 8 (eight) hours as needed for nausea or vomiting. 20 tablet 0   . traMADol (ULTRAM) 50 MG tablet tramadol 50 mg tablet      Current Facility-Administered Medications  Medication Dose Route Frequency Provider Last Rate Last Admin  . triamcinolone acetonide (KENALOG) 10 MG/ML injection 10 mg  10 mg Other Once Harriet Masson, DPM       Allergies  Allergen Reactions  . Lisinopril Cough    Social History   Tobacco Use  . Smoking status: Former Smoker    Packs/day: 0.10    Years: 2.00    Pack years: 0.20    Types: Cigarettes    Quit date:  12/25/1996    Years since quitting: 24.3  . Smokeless tobacco: Never Used  . Tobacco comment: smoked 2-3 cigs daily x 2 years  Substance Use Topics  . Alcohol use: Yes    Alcohol/week: 0.0 standard drinks    Comment: once a month    No family history on file.  Review of Systems Pertinent items are noted in HPI.  Objective:   Vitals: Ht: 5 ft 8.5 in 04/26/2021 01:43 pm BP: 168/ 04/26/2021 01:52 pm Pulse: 110 bpm 04/26/2021 01:48 pm O2Sat: 95% 04/26/2021 01:48 pm  Clinical exam: Robin [name] is a pleasant individual, who appears younger than their stated age. She is alert and orientated 3. No shortness of breath, chest pain.  Heart: Regular rate and rhythm, no rubs, murmurs, or gallops  Lungs: Clear auscultation bilaterally  Abdomen is soft and non-tender, negative loss of bowel and bladder control, no rebound tenderness. Bowel sounds 4  Negative: skin lesions abrasions contusions Peripheral pulses: 2+ dorsalis pedis/posterior tibialis pulses bilaterally. Compartment soft and nontender.  Gait pattern: Altered gait pattern due to severe back and left proximal thigh pain. Patient walks with a antalgic gait to the pain. Assistive devices: Walker  Neuro: Positive numbness and dysesthesias in the L2 and L3 dermatome on the left side. Negative straight leg raise test. Sensation light touch is intact in the other dermatomes. 5/5 motor strength in the lower extremity bilaterally. Should be noted that is difficult to truly assess the left hip flexor strength due to her severe pain. Negative Babinski test, no clonus, 1+ deep tendon reflexes at the knee and absent at the Achilles.  Musculoskeletal: Severe back pain radiating into the left lateral and anterior thigh. Patient does note chronic lower back pain with range of motion. No SI joint pain  Lumbar x-rays taken today in the office  (AP/lateral/spot lateral) were reviewed: Demonstrate degenerative scoliosis apex at L2-3 with degenerative  disc disease. Grade 1 borderline 2 spondylolisthesis at L4-5 with degenerative disc disease. Positive degenerative disc changes L3-4 and L5/S1. No fracture seen  Lumbar MRI: completed on 04/12/21 was reviewed with the patient. [facility] was [facility]; I have independently reviewed the images as well as the radiology report. Grade 1 anterolisthesis L4 on 5. Moderate lumbar scoliosis. L2-3: Large left subarticular disc herniation affecting both the exiting L2 and traversing L3 nerve root. Significant degenerative disc disease noted at this level. L3-4: Moderate central and mild to moderate foraminal stenosis. L4-5: Moderate central stenosis and foraminal stenosis primarily due to the spondylolisthesis. Mild to moderate degenerative changes at L5-S1. No abnormal marrow signal changes.  Assessment:   Robin [name] is a very pleasant 50 year old morbidly obese female with chronic low back pain who presents with acute severe worsening over the last few months. The patient has sensory deficits in significant radicular leg pain primarily in the elbow to L3 dermatome on the left side. Imaging studies clearly demonstrate the degenerative scoliosis, degenerative spondylolisthesis, as well as a significant disc herniation causing left L2 and L3 nerve root compression.   I have gone over the imaging studies with the patient and her husband in great detail. We have discussed treatment options which include injection therapy, as well as surgical intervention. Because of the underlying degenerative scoliosis in order to address the L2-3 disc herniation I would need to move forward with a transforaminal lumbar interbody fusion. In order to adequately expose the disc herniation for removal I would need to do an excessive laminectomy and facetectomy which will result in worsening of her scoliosis. It is for this reason that she would need to supplemental fusion. I have discussed this with her and I have also indicated to  her that she has additional pathology at the L3-4, L4-5, and L5-S1 levels all of which can be contributing to her chronic back pain. Therefore the goal of this surgery would be to reduce not eliminate her back pain, as well as help reduce her neuropathic leg pain. The fact that she has multilevel pathology as well as his morbid obesity all contributes to decreased overall surgical outcomes. However given the severity of her radiculopathy in the size of the disc herniation I think it is reasonable to move forward with the fusion surgery.  Plan:   Risks and benefits of spinal fusion: Infection, bleeding, death, stroke, paralysis, ongoing or worse pain, need for additional surgery, nonunion, leak of spinal fluid, adjacent segment degeneration requiring additional fusion surgery, Injury to abdominal vessels that can require anterior surgery to stop bleeding. Malposition of the cage and/or pedicle screws that could require additional surgery. Loss of bowel and bladder control. Postoperative hematoma causing neurologic compression that could require urgent or emergent re-operation.   Risks and benefits of decompression/discectomy: Infection, bleeding, death, stroke, paralysis, ongoing or worse pain, need for additional surgery, leak of spinal fluid, adjacent segment degeneration requiring additional surgery, post-operative hematoma formation that can result in neurological compromise and the need for urgent/emergent re-operation. Loss in bowel and bladder control. Injury to major vessels that could result in the need for urgent abdominal surgery to stop bleeding. Risk of deep venous thrombosis (DVT) and the need for additional treatment. Recurrent disc herniation resulting in the need for revision surgery, which could include fusion surgery (utilizing instrumentation such as pedicle screws and intervertebral cages).  Additional risk: If instrumentation is used there is a risk of migration, or breakage of that  hardware that could require additional surgery.  Plan: L2-3 transforaminal lumbar interbody fusion.  We have obtained preoperative medical clearance from the patient's primary care provider.  Patient scheduled for LSO brace fitting on Friday.  Reviewed patient's medication list. She is not on any blood thinners. Not using any aspirin products. I have advised her to hold Celebrex 7 days prior to surgery as well in the postoperative period. I have also advised her to avoid any over-the-counter anti-inflammatories. I have refilled her Percocet. I will also prescribe her some Robaxin for multimodal pain management.  We have also discussed the post-operative recovery period to include: bathing/showering restrictions, wound healing, activity (and driving) restrictions, medications/pain mangement.  We have also discussed post-operative  redflags to include: signs and symptoms of postoperative infection, DVT/PE.  Patient was given a copy of her discharge instructions which were reviewed at today's office visit.  All patients questions were invited and answered.  Follow-up: 2 weeks postop

## 2021-04-26 NOTE — H&P (Deleted)
  The note originally documented on this encounter has been moved the the encounter in which it belongs.  

## 2021-05-03 ENCOUNTER — Encounter (HOSPITAL_COMMUNITY)
Admission: RE | Admit: 2021-05-03 | Discharge: 2021-05-03 | Disposition: A | Discharge status: Home / Self Care | Payer: 59 | Source: Ambulatory Visit | Attending: Orthopedic Surgery | Admitting: Orthopedic Surgery

## 2021-05-03 ENCOUNTER — Other Ambulatory Visit: Payer: Self-pay

## 2021-05-03 ENCOUNTER — Encounter (HOSPITAL_COMMUNITY): Payer: Self-pay

## 2021-05-03 DIAGNOSIS — Z01812 Encounter for preprocedural laboratory examination: Secondary | ICD-10-CM | POA: Insufficient documentation

## 2021-05-03 DIAGNOSIS — Z20822 Contact with and (suspected) exposure to covid-19: Secondary | ICD-10-CM | POA: Insufficient documentation

## 2021-05-03 HISTORY — DX: Depression, unspecified: F32.A

## 2021-05-03 HISTORY — DX: Rheumatoid arthritis, unspecified: M06.9

## 2021-05-03 LAB — URINALYSIS, ROUTINE W REFLEX MICROSCOPIC
Bilirubin Urine: NEGATIVE
Glucose, UA: NEGATIVE mg/dL
Hgb urine dipstick: NEGATIVE
Ketones, ur: NEGATIVE mg/dL
Leukocytes,Ua: NEGATIVE
Nitrite: NEGATIVE
Protein, ur: NEGATIVE mg/dL
Specific Gravity, Urine: 1.018 (ref 1.005–1.030)
pH: 7 (ref 5.0–8.0)

## 2021-05-03 LAB — SARS CORONAVIRUS 2 (TAT 6-24 HRS): SARS Coronavirus 2: NEGATIVE

## 2021-05-03 LAB — CBC
HCT: 45.3 % (ref 36.0–46.0)
Hemoglobin: 14.3 g/dL (ref 12.0–15.0)
MCH: 27.2 pg (ref 26.0–34.0)
MCHC: 31.6 g/dL (ref 30.0–36.0)
MCV: 86.3 fL (ref 80.0–100.0)
Platelets: 290 10*3/uL (ref 150–400)
RBC: 5.25 MIL/uL — ABNORMAL HIGH (ref 3.87–5.11)
RDW: 12.1 % (ref 11.5–15.5)
WBC: 8.8 10*3/uL (ref 4.0–10.5)
nRBC: 0 % (ref 0.0–0.2)

## 2021-05-03 LAB — APTT: aPTT: 27 seconds (ref 24–36)

## 2021-05-03 LAB — TYPE AND SCREEN
ABO/RH(D): B POS
Antibody Screen: NEGATIVE

## 2021-05-03 LAB — BASIC METABOLIC PANEL
Anion gap: 11 (ref 5–15)
BUN: 9 mg/dL (ref 6–20)
CO2: 26 mmol/L (ref 22–32)
Calcium: 9 mg/dL (ref 8.9–10.3)
Chloride: 103 mmol/L (ref 98–111)
Creatinine, Ser: 0.77 mg/dL (ref 0.44–1.00)
GFR, Estimated: >60 mL/min (ref >60)
Glucose, Bld: 96 mg/dL (ref 70–99)
Potassium: 3.4 mmol/L — ABNORMAL LOW (ref 3.5–5.1)
Sodium: 140 mmol/L (ref 135–145)

## 2021-05-03 LAB — SURGICAL PCR SCREEN
MRSA, PCR: NEGATIVE
Staphylococcus aureus: POSITIVE — AB

## 2021-05-03 LAB — PROTIME-INR
INR: 1.1 (ref 0.8–1.2)
Prothrombin Time: 13.8 seconds (ref 11.4–15.2)

## 2021-05-03 NOTE — Progress Notes (Signed)
PCP - Dr Seth Bake Cardiologist - denies  Chest x-ray - 12/01/20, no coughing, chest congestion EKG - request Stress Test - denies ECHO - denies Cardiac Cath - denies  Sleep Study - > 10 years CPAP - no, supposed to wear  COVID TEST- pending   Anesthesia review: yes records requested  Patient denies shortness of breath, fever, cough and chest pain at PAT appointment   All instructions explained to the patient, with a verbal understanding of the material. Patient agrees to go over the instructions while at home for a better understanding. Patient also instructed to self quarantine after being tested for COVID-19. The opportunity to ask questions was provided.

## 2021-05-03 NOTE — Progress Notes (Signed)
Surgical Instructions    Your procedure is scheduled on May 12  Report to [facility] Main Entrance "A" at 1215 P.M., then check in with the Admitting office.  Call this number if you have problems the morning of surgery:  [phone]   If you have any questions prior to your surgery date call [phone]: Open Monday-Friday 8am-4pm    Remember:  Do not eat  Or drink after midnight the night before your surgery     Take these medicines the morning of surgery with A SIP OF WATER  amLODipine (NORVASC) atorvastatin (LIPITOR) buPROPion (WELLBUTRIN XL escitalopram (LEXAPRO) gabapentin (NEURONTIN)  HYDROcodone-acetaminophen (NORCO/VICODIN) if needed methocarbamol (ROBAXIN) if needed omeprazole (PRILOSEC OTC)   As of today, STOP taking any Aspirin (unless otherwise instructed by your surgeon) Aleve, Naproxen, Ibuprofen, Motrin, Advil, Goody's, BC's, all herbal medications, fish oil, and all vitamins.                     Do not wear jewelry, make up, or nail polish            Do not wear lotions, powders, perfumes, or deodorant.            Do not shave 48 hours prior to surgery.              Do not bring valuables to the hospital.            [facility] is not responsible for any belongings or valuables.  Do NOT Smoke (Tobacco/Vaping) or drink Alcohol 24 hours prior to your procedure If you use a CPAP at night, you may bring all equipment for your overnight stay.   Contacts, glasses, dentures or bridgework may not be worn into surgery, please bring cases for these belongings   For patients admitted to the hospital, discharge time will be determined by your treatment team.   Patients discharged the day of surgery will not be allowed to drive home, and someone needs to stay with them for 24 hours.    Special instructions:   [facility]- Preparing For Surgery  Before surgery, you can play an important role. Because skin is not sterile, your skin needs to be as free of germs as  possible. You can reduce the number of germs on your skin by washing with CHG (chlorahexidine gluconate) Soap before surgery.  CHG is an antiseptic cleaner which kills germs and bonds with the skin to continue killing germs even after washing.    Oral Hygiene is also important to reduce your risk of infection.  Remember - BRUSH YOUR TEETH THE MORNING OF SURGERY WITH YOUR REGULAR TOOTHPASTE  Please do not use if you have an allergy to CHG or antibacterial soaps. If your skin becomes reddened/irritated stop using the CHG.  Do not shave (including legs and underarms) for at least 48 hours prior to first CHG shower. It is OK to shave your face.  Please follow these instructions carefully.   1. Shower the NIGHT BEFORE SURGERY and the MORNING OF SURGERY  2. If you chose to wash your hair, wash your hair first as usual with your normal shampoo.  3. After you shampoo, rinse your hair and body thoroughly to remove the shampoo.  4. Wash Face and genitals (private parts) with your normal soap.   5.  Shower the NIGHT BEFORE SURGERY and the MORNING OF SURGERY with CHG Soap.   6. Use CHG Soap as you would any other liquid soap. You can  apply CHG directly to the skin and wash gently with a scrungie or a clean washcloth.   7. Apply the CHG Soap to your body ONLY FROM THE NECK DOWN.  Do not use on open wounds or open sores. Avoid contact with your eyes, ears, mouth and genitals (private parts). Wash Face and genitals (private parts)  with your normal soap.   8. Wash thoroughly, paying special attention to the area where your surgery will be performed.  9. Thoroughly rinse your body with warm water from the neck down.  10. DO NOT shower/wash with your normal soap after using and rinsing off the CHG Soap.  11. Pat yourself dry with a CLEAN TOWEL.  12. Wear CLEAN PAJAMAS to bed the night before surgery  13. Place CLEAN SHEETS on your bed the night before your surgery  14. DO NOT SLEEP WITH  PETS.   Day of Surgery: Take a shower with CHG soap. Wear Clean/Comfortable clothing the morning of surgery Do not apply any deodorants/lotions.   Remember to brush your teeth WITH YOUR REGULAR TOOTHPASTE.   Please read over the following fact sheets that you were given.

## 2021-05-05 ENCOUNTER — Inpatient Hospital Stay (HOSPITAL_COMMUNITY): Payer: 59

## 2021-05-05 ENCOUNTER — Inpatient Hospital Stay (HOSPITAL_COMMUNITY)
Admission: RE | Admit: 2021-05-05 | Discharge: 2021-05-06 | DRG: 454 | Disposition: A | Discharge status: Home / Self Care | Payer: 59 | Attending: Orthopedic Surgery | Admitting: Orthopedic Surgery

## 2021-05-05 ENCOUNTER — Other Ambulatory Visit: Payer: Self-pay

## 2021-05-05 ENCOUNTER — Inpatient Hospital Stay (HOSPITAL_COMMUNITY)
Admission: RE | Disposition: A | Discharge status: Home / Self Care | Payer: Self-pay | Source: Home / Self Care | Attending: Orthopedic Surgery

## 2021-05-05 ENCOUNTER — Inpatient Hospital Stay (HOSPITAL_COMMUNITY): Payer: 59 | Admitting: Physician Assistant

## 2021-05-05 ENCOUNTER — Encounter (HOSPITAL_COMMUNITY): Payer: Self-pay | Admitting: Orthopedic Surgery

## 2021-05-05 DIAGNOSIS — Z20822 Contact with and (suspected) exposure to covid-19: Secondary | ICD-10-CM | POA: Diagnosis present

## 2021-05-05 DIAGNOSIS — Z79899 Other long term (current) drug therapy: Secondary | ICD-10-CM | POA: Diagnosis not present

## 2021-05-05 DIAGNOSIS — Z87891 Personal history of nicotine dependence: Secondary | ICD-10-CM

## 2021-05-05 DIAGNOSIS — Z6841 Body Mass Index (BMI) 40.0 and over, adult: Secondary | ICD-10-CM

## 2021-05-05 DIAGNOSIS — M4186 Other forms of scoliosis, lumbar region: Secondary | ICD-10-CM | POA: Diagnosis present

## 2021-05-05 DIAGNOSIS — G8929 Other chronic pain: Secondary | ICD-10-CM | POA: Diagnosis present

## 2021-05-05 DIAGNOSIS — M5116 Intervertebral disc disorders with radiculopathy, lumbar region: Secondary | ICD-10-CM | POA: Diagnosis present

## 2021-05-05 DIAGNOSIS — M4316 Spondylolisthesis, lumbar region: Secondary | ICD-10-CM | POA: Diagnosis present

## 2021-05-05 DIAGNOSIS — Z419 Encounter for procedure for purposes other than remedying health state, unspecified: Secondary | ICD-10-CM

## 2021-05-05 DIAGNOSIS — M48061 Spinal stenosis, lumbar region without neurogenic claudication: Secondary | ICD-10-CM | POA: Diagnosis present

## 2021-05-05 DIAGNOSIS — G4733 Obstructive sleep apnea (adult) (pediatric): Secondary | ICD-10-CM | POA: Diagnosis present

## 2021-05-05 DIAGNOSIS — Z888 Allergy status to other drugs, medicaments and biological substances status: Secondary | ICD-10-CM | POA: Diagnosis not present

## 2021-05-05 DIAGNOSIS — Z981 Arthrodesis status: Secondary | ICD-10-CM

## 2021-05-05 HISTORY — PX: TRANSFORAMINAL LUMBAR INTERBODY FUSION (TLIF) WITH PEDICLE SCREW FIXATION 1 LEVEL: SHX6141

## 2021-05-05 LAB — ABO/RH: ABO/RH(D): B POS

## 2021-05-05 SURGERY — TRANSFORAMINAL LUMBAR INTERBODY FUSION (TLIF) WITH PEDICLE SCREW FIXATION 1 LEVEL
Anesthesia: General | Site: Spine Lumbar

## 2021-05-05 MED ORDER — LACTATED RINGERS IV SOLN: INTRAVENOUS | Status: DC

## 2021-05-05 MED ORDER — TRANEXAMIC ACID-NACL 1000-0.7 MG/100ML-% IV SOLN
INTRAVENOUS | Status: AC
  Filled 2021-05-05: qty 100

## 2021-05-05 MED ORDER — ACETAMINOPHEN 10 MG/ML IV SOLN
INTRAVENOUS | Status: AC
  Filled 2021-05-05: qty 100

## 2021-05-05 MED ORDER — ONDANSETRON HCL 4 MG/2ML IJ SOLN: 4.0000 mg | Freq: Four times a day (QID) | INTRAMUSCULAR | Status: DC | PRN

## 2021-05-05 MED ORDER — FENTANYL CITRATE (PF) 100 MCG/2ML IJ SOLN
25.0000 ug | INTRAMUSCULAR | Status: DC | PRN
  Administered 2021-05-05 (×2): 50 ug via INTRAVENOUS

## 2021-05-05 MED ORDER — OXYCODONE HCL 5 MG PO TABS
ORAL_TABLET | ORAL | Status: AC
  Filled 2021-05-05: qty 2

## 2021-05-05 MED ORDER — THROMBIN (RECOMBINANT) 20000 UNITS EX SOLR
CUTANEOUS | Status: AC
  Filled 2021-05-05: qty 20000

## 2021-05-05 MED ORDER — METHOCARBAMOL 500 MG PO TABS
ORAL_TABLET | ORAL | Status: AC
  Filled 2021-05-05: qty 1

## 2021-05-05 MED ORDER — FENTANYL CITRATE (PF) 100 MCG/2ML IJ SOLN
INTRAMUSCULAR | Status: AC
  Filled 2021-05-05: qty 2

## 2021-05-05 MED ORDER — OXYCODONE HCL 5 MG PO TABS
5.0000 mg | ORAL_TABLET | ORAL | Status: DC | PRN
Start: 2021-05-05 — End: 2021-05-06

## 2021-05-05 MED ORDER — FENTANYL CITRATE (PF) 250 MCG/5ML IJ SOLN
INTRAMUSCULAR | Status: DC | PRN
  Administered 2021-05-05 (×6): 50 ug via INTRAVENOUS

## 2021-05-05 MED ORDER — MIDAZOLAM HCL 2 MG/2ML IJ SOLN
INTRAMUSCULAR | Status: DC | PRN
  Administered 2021-05-05: 2 mg via INTRAVENOUS

## 2021-05-05 MED ORDER — GELATIN ABSORBABLE 100 EX MISC
CUTANEOUS | Status: DC | PRN
  Administered 2021-05-05: 1

## 2021-05-05 MED ORDER — ORAL CARE MOUTH RINSE
15.0000 mL | Freq: Once | OROMUCOSAL | Status: AC
Start: 2021-05-05 — End: 2021-05-05

## 2021-05-05 MED ORDER — CEFAZOLIN SODIUM 1 G IJ SOLR
INTRAMUSCULAR | Status: AC
  Filled 2021-05-05: qty 30

## 2021-05-05 MED ORDER — PROPOFOL 10 MG/ML IV BOLUS
INTRAVENOUS | Status: AC
  Filled 2021-05-05: qty 20

## 2021-05-05 MED ORDER — BUPIVACAINE-EPINEPHRINE 0.25% -1:200000 IJ SOLN
INTRAMUSCULAR | Status: DC | PRN
  Administered 2021-05-05: 20 mL

## 2021-05-05 MED ORDER — HYDRALAZINE HCL 10 MG PO TABS
25.0000 mg | ORAL_TABLET | Freq: Three times a day (TID) | ORAL | Status: DC
  Administered 2021-05-05 – 2021-05-06 (×2): 25 mg via ORAL
  Filled 2021-05-05 (×2): qty 3

## 2021-05-05 MED ORDER — ROCURONIUM BROMIDE 10 MG/ML (PF) SYRINGE
PREFILLED_SYRINGE | INTRAVENOUS | Status: AC
  Filled 2021-05-05: qty 10

## 2021-05-05 MED ORDER — ACETAMINOPHEN 325 MG PO TABS
650.0000 mg | ORAL_TABLET | ORAL | Status: DC | PRN
  Administered 2021-05-05 – 2021-05-06 (×2): 650 mg via ORAL
  Filled 2021-05-05 (×2): qty 2

## 2021-05-05 MED ORDER — BUPIVACAINE-EPINEPHRINE (PF) 0.25% -1:200000 IJ SOLN
INTRAMUSCULAR | Status: AC
  Filled 2021-05-05: qty 30

## 2021-05-05 MED ORDER — CHLORHEXIDINE GLUCONATE 0.12 % MT SOLN: 15.0000 mL | Freq: Once | OROMUCOSAL | Status: AC

## 2021-05-05 MED ORDER — ONDANSETRON HCL 4 MG/2ML IJ SOLN
INTRAMUSCULAR | Status: AC
  Filled 2021-05-05: qty 2

## 2021-05-05 MED ORDER — ACETAMINOPHEN 650 MG RE SUPP: 650.0000 mg | RECTAL | Status: DC | PRN

## 2021-05-05 MED ORDER — HYDROCHLOROTHIAZIDE 25 MG PO TABS
25.0000 mg | ORAL_TABLET | Freq: Every day | ORAL | Status: DC
  Administered 2021-05-06: 25 mg via ORAL
  Filled 2021-05-05: qty 1

## 2021-05-05 MED ORDER — POLYETHYLENE GLYCOL 3350 17 G PO PACK: 17.0000 g | PACK | Freq: Every day | ORAL | Status: DC | PRN

## 2021-05-05 MED ORDER — EPHEDRINE SULFATE-NACL 50-0.9 MG/10ML-% IV SOSY
PREFILLED_SYRINGE | INTRAVENOUS | Status: DC | PRN
  Administered 2021-05-05: 5 mg via INTRAVENOUS

## 2021-05-05 MED ORDER — PHENOL 1.4 % MT LIQD: 1.0000 | OROMUCOSAL | Status: DC | PRN

## 2021-05-05 MED ORDER — FENTANYL CITRATE (PF) 250 MCG/5ML IJ SOLN
INTRAMUSCULAR | Status: AC
  Filled 2021-05-05: qty 5

## 2021-05-05 MED ORDER — GABAPENTIN 300 MG PO CAPS
300.0000 mg | ORAL_CAPSULE | Freq: Three times a day (TID) | ORAL | Status: DC
  Administered 2021-05-05 – 2021-05-06 (×2): 300 mg via ORAL
  Filled 2021-05-05 (×2): qty 1

## 2021-05-05 MED ORDER — SUGAMMADEX SODIUM 200 MG/2ML IV SOLN
INTRAVENOUS | Status: DC | PRN
  Administered 2021-05-05: 200 mg via INTRAVENOUS

## 2021-05-05 MED ORDER — LIDOCAINE 2% (20 MG/ML) 5 ML SYRINGE
INTRAMUSCULAR | Status: DC | PRN
  Administered 2021-05-05: 100 mg via INTRAVENOUS

## 2021-05-05 MED ORDER — SUCCINYLCHOLINE CHLORIDE 200 MG/10ML IV SOSY
PREFILLED_SYRINGE | INTRAVENOUS | Status: DC | PRN
  Administered 2021-05-05: 160 mg via INTRAVENOUS

## 2021-05-05 MED ORDER — METHOCARBAMOL 500 MG PO TABS: 500.0000 mg | ORAL_TABLET | Freq: Three times a day (TID) | ORAL | 0 refills | Status: AC | PRN

## 2021-05-05 MED ORDER — HYDROMORPHONE HCL 1 MG/ML IJ SOLN
1.0000 mg | INTRAMUSCULAR | Status: DC | PRN
  Administered 2021-05-06: 1 mg via INTRAVENOUS
  Filled 2021-05-05: qty 1

## 2021-05-05 MED ORDER — CEFAZOLIN SODIUM-DEXTROSE 2-4 GM/100ML-% IV SOLN
2.0000 g | Freq: Three times a day (TID) | INTRAVENOUS | Status: AC
Start: 2021-05-05 — End: 2021-05-06
  Administered 2021-05-05 – 2021-05-06 (×2): 2 g via INTRAVENOUS
  Filled 2021-05-05 (×2): qty 100

## 2021-05-05 MED ORDER — OXYCODONE-ACETAMINOPHEN 10-325 MG PO TABS: 1.0000 | ORAL_TABLET | Freq: Four times a day (QID) | ORAL | 0 refills | Status: AC | PRN

## 2021-05-05 MED ORDER — PROPOFOL 1000 MG/100ML IV EMUL
INTRAVENOUS | Status: AC
  Filled 2021-05-05: qty 100

## 2021-05-05 MED ORDER — MENTHOL 3 MG MT LOZG: 1.0000 | LOZENGE | OROMUCOSAL | Status: DC | PRN

## 2021-05-05 MED ORDER — DEXTROSE 5 % IV SOLN
INTRAVENOUS | Status: DC | PRN
  Administered 2021-05-05 (×2): 3 g via INTRAVENOUS

## 2021-05-05 MED ORDER — PROPOFOL 10 MG/ML IV BOLUS
INTRAVENOUS | Status: DC | PRN
  Administered 2021-05-05: 250 mg via INTRAVENOUS

## 2021-05-05 MED ORDER — METHOCARBAMOL 1000 MG/10ML IJ SOLN
500.0000 mg | Freq: Four times a day (QID) | INTRAVENOUS | Status: DC | PRN
  Filled 2021-05-05: qty 5

## 2021-05-05 MED ORDER — OXYCODONE HCL 5 MG PO TABS
10.0000 mg | ORAL_TABLET | ORAL | Status: DC | PRN
  Administered 2021-05-05 – 2021-05-06 (×5): 10 mg via ORAL
  Filled 2021-05-05 (×4): qty 2

## 2021-05-05 MED ORDER — MIDAZOLAM HCL 2 MG/2ML IJ SOLN
INTRAMUSCULAR | Status: AC
  Filled 2021-05-05: qty 2

## 2021-05-05 MED ORDER — ONDANSETRON HCL 4 MG/2ML IJ SOLN
INTRAMUSCULAR | Status: DC | PRN
  Administered 2021-05-05: 4 mg via INTRAVENOUS

## 2021-05-05 MED ORDER — ONDANSETRON HCL 4 MG PO TABS: 4.0000 mg | ORAL_TABLET | Freq: Four times a day (QID) | ORAL | Status: DC | PRN

## 2021-05-05 MED ORDER — CHLORHEXIDINE GLUCONATE 0.12 % MT SOLN
OROMUCOSAL | Status: AC
  Administered 2021-05-05: 15 mL via OROMUCOSAL
  Filled 2021-05-05: qty 15

## 2021-05-05 MED ORDER — DEXAMETHASONE SODIUM PHOSPHATE 10 MG/ML IJ SOLN
INTRAMUSCULAR | Status: AC
  Filled 2021-05-05: qty 1

## 2021-05-05 MED ORDER — ALBUMIN HUMAN 5 % IV SOLN: INTRAVENOUS | Status: DC | PRN

## 2021-05-05 MED ORDER — ONDANSETRON HCL 4 MG PO TABS: 4.0000 mg | ORAL_TABLET | Freq: Three times a day (TID) | ORAL | 0 refills | Status: DC | PRN

## 2021-05-05 MED ORDER — SODIUM CHLORIDE 0.9% FLUSH: 3.0000 mL | INTRAVENOUS | Status: DC | PRN

## 2021-05-05 MED ORDER — PHENYLEPHRINE HCL-NACL 10-0.9 MG/250ML-% IV SOLN
INTRAVENOUS | Status: DC | PRN
  Administered 2021-05-05: 25 ug/min via INTRAVENOUS

## 2021-05-05 MED ORDER — TRANEXAMIC ACID-NACL 1000-0.7 MG/100ML-% IV SOLN
INTRAVENOUS | Status: DC | PRN
  Administered 2021-05-05: 1000 mg via INTRAVENOUS

## 2021-05-05 MED ORDER — 0.9 % SODIUM CHLORIDE (POUR BTL) OPTIME
TOPICAL | Status: DC | PRN
  Administered 2021-05-05: 1000 mL

## 2021-05-05 MED ORDER — AMLODIPINE BESYLATE 5 MG PO TABS
10.0000 mg | ORAL_TABLET | Freq: Every day | ORAL | Status: DC
  Administered 2021-05-06: 10 mg via ORAL
  Filled 2021-05-05: qty 2

## 2021-05-05 MED ORDER — CEFAZOLIN IN SODIUM CHLORIDE 3-0.9 GM/100ML-% IV SOLN: 3.0000 g | INTRAVENOUS | Status: DC

## 2021-05-05 MED ORDER — ESCITALOPRAM OXALATE 10 MG PO TABS
10.0000 mg | ORAL_TABLET | Freq: Every day | ORAL | Status: DC
  Administered 2021-05-06: 10 mg via ORAL
  Filled 2021-05-05: qty 1

## 2021-05-05 MED ORDER — THROMBIN 20000 UNITS EX SOLR
CUTANEOUS | Status: DC | PRN
  Administered 2021-05-05: 20 mL via TOPICAL

## 2021-05-05 MED ORDER — SODIUM CHLORIDE 0.9% FLUSH
3.0000 mL | Freq: Two times a day (BID) | INTRAVENOUS | Status: DC
  Administered 2021-05-06: 3 mL via INTRAVENOUS

## 2021-05-05 MED ORDER — BUPROPION HCL ER (XL) 300 MG PO TB24
300.0000 mg | ORAL_TABLET | Freq: Every day | ORAL | Status: DC
  Administered 2021-05-06: 300 mg via ORAL
  Filled 2021-05-05: qty 1

## 2021-05-05 MED ORDER — ACETAMINOPHEN 10 MG/ML IV SOLN
INTRAVENOUS | Status: DC | PRN
  Administered 2021-05-05: 1000 mg via INTRAVENOUS

## 2021-05-05 MED ORDER — DEXAMETHASONE SODIUM PHOSPHATE 10 MG/ML IJ SOLN
INTRAMUSCULAR | Status: DC | PRN
  Administered 2021-05-05: 10 mg via INTRAVENOUS

## 2021-05-05 MED ORDER — METHOCARBAMOL 500 MG PO TABS
500.0000 mg | ORAL_TABLET | Freq: Four times a day (QID) | ORAL | Status: DC | PRN
  Administered 2021-05-05 – 2021-05-06 (×3): 500 mg via ORAL
  Filled 2021-05-05 (×2): qty 1

## 2021-05-05 MED ORDER — LIDOCAINE 2% (20 MG/ML) 5 ML SYRINGE
INTRAMUSCULAR | Status: AC
  Filled 2021-05-05: qty 5

## 2021-05-05 MED ORDER — ROCURONIUM BROMIDE 10 MG/ML (PF) SYRINGE
PREFILLED_SYRINGE | INTRAVENOUS | Status: DC | PRN
  Administered 2021-05-05: 40 mg via INTRAVENOUS

## 2021-05-05 MED ORDER — PROPOFOL 500 MG/50ML IV EMUL
INTRAVENOUS | Status: DC | PRN
  Administered 2021-05-05: 75 ug/kg/min via INTRAVENOUS

## 2021-05-05 SURGICAL SUPPLY — 72 items
BIT DRILL PLIF MAS DISP 5.5MM (DRILL) ×1 IMPLANT
BLADE CLIPPER SURG (BLADE) IMPLANT
BONE MATRIX OSTEOCEL PRO SM (Bone Implant) ×4 IMPLANT
BUR EGG ELITE 4.0 (BURR) IMPLANT
BUR MATCHSTICK NEURO 3.0 LAGG (BURR) ×2 IMPLANT
CABLE BIPOLOR RESECTION CORD (MISCELLANEOUS) ×2 IMPLANT
CANISTER SUCT 3000ML PPV (MISCELLANEOUS) ×2 IMPLANT
CAP RELINE MOD TULIP RMM (Cap) ×8 IMPLANT
CATH FOLEY 2WAY SLVR  5CC 12FR (CATHETERS) ×2
CATH FOLEY 2WAY SLVR 5CC 12FR (CATHETERS) ×1 IMPLANT
CLIP NEUROVISION LG (CLIP) ×2 IMPLANT
CLSR STERI-STRIP ANTIMIC 1/2X4 (GAUZE/BANDAGES/DRESSINGS) ×2 IMPLANT
COVER SURGICAL LIGHT HANDLE (MISCELLANEOUS) ×2 IMPLANT
COVER WAND RF STERILE (DRAPES) ×2 IMPLANT
DRAPE C-ARM 42X72 X-RAY (DRAPES) ×2 IMPLANT
DRAPE C-ARMOR (DRAPES) ×2 IMPLANT
DRAPE POUCH INSTRU U-SHP 10X18 (DRAPES) ×2 IMPLANT
DRAPE SURG 17X23 STRL (DRAPES) ×2 IMPLANT
DRAPE U-SHAPE 47X51 STRL (DRAPES) ×2 IMPLANT
DRILL PLIF MAS DISP 5.5MM (DRILL) ×2
DRSG OPSITE POSTOP 4X8 (GAUZE/BANDAGES/DRESSINGS) ×2 IMPLANT
DURAPREP 26ML APPLICATOR (WOUND CARE) ×2 IMPLANT
ELECT BLADE 4.0 EZ CLEAN MEGAD (MISCELLANEOUS) ×2
ELECT BLADE 6.5 EXT (BLADE) IMPLANT
ELECT PENCIL ROCKER SW 15FT (MISCELLANEOUS) ×2 IMPLANT
ELECT REM PT RETURN 9FT ADLT (ELECTROSURGICAL) ×2
ELECTRODE BLDE 4.0 EZ CLN MEGD (MISCELLANEOUS) ×1 IMPLANT
ELECTRODE REM PT RTRN 9FT ADLT (ELECTROSURGICAL) ×1 IMPLANT
GLOVE BIOGEL PI IND STRL 8.5 (GLOVE) ×1 IMPLANT
GLOVE BIOGEL PI INDICATOR 8.5 (GLOVE) ×1
GLOVE SS BIOGEL STRL SZ 8.5 (GLOVE) ×2 IMPLANT
GLOVE SUPERSENSE BIOGEL SZ 8.5 (GLOVE) ×2
GOWN STRL REUS W/ TWL LRG LVL3 (GOWN DISPOSABLE) ×2 IMPLANT
GOWN STRL REUS W/TWL 2XL LVL3 (GOWN DISPOSABLE) ×2 IMPLANT
GOWN STRL REUS W/TWL LRG LVL3 (GOWN DISPOSABLE) ×4
IMPL TLX20 9X11X26 20D (Cage) ×1 IMPLANT
KIT BASIN OR (CUSTOM PROCEDURE TRAY) ×2 IMPLANT
KIT POSITION SURG JACKSON T1 (MISCELLANEOUS) IMPLANT
KIT TURNOVER KIT B (KITS) ×2 IMPLANT
MILL MEDIUM DISP (BLADE) ×2 IMPLANT
MODULE EMG NEEDLE SSEP NVM5 (NEEDLE) ×4 IMPLANT
MODULE NVM5 NEXT GEN EMG (NEEDLE) ×2 IMPLANT
NEEDLE 22X1 1/2 (OR ONLY) (NEEDLE) ×2 IMPLANT
NEEDLE SPNL 18GX3.5 QUINCKE PK (NEEDLE) ×4 IMPLANT
NS IRRIG 1000ML POUR BTL (IV SOLUTION) ×2 IMPLANT
PACK LAMINECTOMY ORTHO (CUSTOM PROCEDURE TRAY) ×2 IMPLANT
PACK UNIVERSAL I (CUSTOM PROCEDURE TRAY) ×2 IMPLANT
PAD ARMBOARD 7.5X6 YLW CONV (MISCELLANEOUS) ×4 IMPLANT
PATTIES SURGICAL .5 X.5 (GAUZE/BANDAGES/DRESSINGS) ×4 IMPLANT
PATTIES SURGICAL .5 X1 (DISPOSABLE) ×2 IMPLANT
POSITIONER HEAD PRONE TRACH (MISCELLANEOUS) ×2 IMPLANT
PROBE BALL TIP NVM5 SNG USE (BALLOONS) ×2 IMPLANT
ROD RELINE 5.0X45MM (Rod) ×4 IMPLANT
SCREW LOCK RSS 4.5/5.0MM (Screw) ×10 IMPLANT
SCREW SHANK RELINE MOD 5.5X35 (Screw) ×4 IMPLANT
SHANK RELINE MOD 5.5X40 (Screw) ×4 IMPLANT
SPONGE LAP 4X18 RFD (DISPOSABLE) ×4 IMPLANT
SPONGE SURGIFOAM ABS GEL 100 (HEMOSTASIS) ×2 IMPLANT
SURGIFLO W/THROMBIN 8M KIT (HEMOSTASIS) ×4 IMPLANT
SUT BONE WAX W31G (SUTURE) ×4 IMPLANT
SUT MNCRL AB 3-0 PS2 27 (SUTURE) ×4 IMPLANT
SUT VIC AB 1 CT1 18XCR BRD 8 (SUTURE) ×1 IMPLANT
SUT VIC AB 1 CT1 8-18 (SUTURE) ×2
SUT VIC AB 2-0 CT1 18 (SUTURE) ×2 IMPLANT
SYR BULB IRRIG 60ML STRL (SYRINGE) ×2 IMPLANT
SYR CONTROL 10ML LL (SYRINGE) ×2 IMPLANT
TLX20 IMPLANT 9X11X26 20D (Cage) ×2 IMPLANT
TOWEL GREEN STERILE (TOWEL DISPOSABLE) ×2 IMPLANT
TOWEL GREEN STERILE FF (TOWEL DISPOSABLE) ×2 IMPLANT
TRAY FOLEY MTR SLVR 16FR STAT (SET/KITS/TRAYS/PACK) ×2 IMPLANT
WATER STERILE IRR 1000ML POUR (IV SOLUTION) ×2 IMPLANT
YANKAUER SUCT BULB TIP NO VENT (SUCTIONS) ×2 IMPLANT

## 2021-05-05 NOTE — Brief Op Note (Signed)
05/05/2021  5:00 PM  PATIENT:  Robin [name]  50 y.o. female  PRE-OPERATIVE DIAGNOSIS:  L2-3 degenerative disc disease with disc hernation  POST-OPERATIVE DIAGNOSIS:  L2-3 degenerative disc disease with disc hernation  PROCEDURE:  Procedure(s) with comments: TRANSFORAMINAL LUMBAR INTERBODY FUSION (TLIF) WITH PEDICLE SCREW FIXATION 1 LEVEL L2-3 (N/A) - 4 hrs  SURGEON:  Surgeon(s) and Role:    Venita Lick, MD - Primary  PHYSICIAN ASSISTANT:   ASSISTANTS: April Greene, RNFA   ANESTHESIA:   general  EBL:  250 mL   BLOOD ADMINISTERED:none  DRAINS: none   LOCAL MEDICATIONS USED:  MARCAINE     SPECIMEN:  No Specimen  DISPOSITION OF SPECIMEN:  N/A  COUNTS:  YES  TOURNIQUET:  * No tourniquets in log *  DICTATION: .Dragon Dictation  PLAN OF CARE: Admit to inpatient   PATIENT DISPOSITION:  PACU - hemodynamically stable.

## 2021-05-05 NOTE — Discharge Instructions (Signed)

## 2021-05-05 NOTE — Op Note (Signed)
OPERATIVE REPORT  DATE OF SURGERY: 05/05/2021  PATIENT NAME:  Robin [name] MRN: [id number] DOB: November 25, 1971  PCP: Lorenda Ishihara, MD  PRE-OPERATIVE DIAGNOSIS: Acute L2-3 left-sided disc herniation with radiculopathy  POST-OPERATIVE DIAGNOSIS: Same  PROCEDURE:   Transforaminal lumbar interbody fusion L2-3 Posterior lateral arthrodesis L2-3  SURGEON:  Venita Lick, MD  Assistant: April Green, RNFA  ANESTHESIA:   General  EBL: 250 ml  Implants: NuVasive expandable TLIF cage (TLX): 9 x 11 x 2620 degree lordosis. NuVasive cortical pedicle screws: 5.5 x 40 mm length at L to, 5.5 x 35 mm at L3.  Allograft: Osteocell Autograft: Local bone from decompression utilized in posterior lateral arthrodesis  BRIEF HISTORY: Robin [name] is a 50 y.o. female who resented to my office with severe acute onset of radicular left leg pain.  Patient has had chronic back pain and imaging studies confirm degenerative disc disease at L5-S1, anterior listhesis at L4-5, moderate degenerative disc disease at L3-4, and acute L2-3 disc herniation posterior lateral to the left.  Patient's clinical exam was consistent with left L2 and L3 radiculopathy due to the large disc herniation that had migrated cranially.  As result of the severity of the pain she elected to move forward with surgery.  Because of the need for an extensive decompression I elected to move forward with a fusion.  I went over the risks, benefits, and alternatives to surgery with the patient and consent was obtained.  PROCEDURE DETAILS: Patient was brought into the operating room. After successful induction of general anesthesia and endotracheal intubation a Time Out was done. This confirmed all pertinent important data.  Foley, teds, SCDs, and intraoperative neuro monitoring needles were placed by the representative.  Patient was turned prone onto the Wilson frame and all bony prominences were well-padded.  The back was then  prepped and draped in a standard fashion.  Using fluoroscopy identified the L2 and L3 pedicles and marked out the incision site.  This was infiltrated with quarter percent Marcaine with epinephrine.  A midline incision was made starting at the inferior aspect of L1 and proceeding down to the superior aspect of L4.  Sharp dissection was carried out down to the deep fascia.  The deep fascia was sharply incised and I stripped the paraspinal muscles to expose the L2 and L3 spinous process as well as the lamina.  I then identified the facet capsule.  At this point a self-retaining retractor was placed and had excellent exposure to the posterior aspect of the spine.  At this point time a high-speed drill was placed at the 7 o'clock position of the right L2 pedicle.  Using AP fluoroscopy confirmed satisfactory position and I advanced through the cortex.  I then used the pedicle probe and advanced through the pedicle.  I started at the 7 o'clock position and gradually approached the 1 o'clock position on this pedicle.  As I neared the midportion of the pedicle I confirmed in the lateral view that I was in the midportion of the pedicle.  As I advanced towards the 1 o'clock position of the pedicle I confirmed that I was beyond the posterior wall of the vertebral body in the lateral view.  Once I confirmed satisfactory position and trajectory I remove the pedicle and probed the hole.  Using the ball-tipped probe I confirmed solid bony canal.  I then used the tap and then repalpated the hole.  A 40 mm length screw was placed at L2.  Using this exact same  technique I placed a cortical pedicle screw at L3.  This screw was 35 mm in length.  I then directly stimulated both of these pedicles and at L2 there was no adverse activity at greater than 40 mA, and at L3 there was no activity greater than 19mA.  At this point time I then placed the cortical screws on the left side.  This time I began at the 5 o'clock position of the L2  pedicle and aimed towards the 11 o'clock position.  I again used AP and lateral fluoroscopy to manage the trajectory and positioning of the awl as I advanced into the pedicle.  Once I was properly seated I remove the pedicle all and palpated the hole with a ball-tipped feeler I then used the tap and then repalpated the hole.  At this point the screw was placed.  Both screws were then directly stimulated and was no adverse activity at greater than 40 mA.  At this point time with all 4 cortical pedicle screws in place I proceeded with the decompression.  The L2 spinous process was removed with double-action Leksell rongeur and a laminotomy of L2 was performed with a 3 mm Kerrison rongeur.  On the left side a more comprehensive near complete laminectomy was performed.  I removed the inferior L2 facet in its entirety.  A medial fasectomy was performed with the kerrison ronguer to complete the lateral recess decompression.    At this point time the L2 nerve root was visualized and the remainder of the pars was taken down.  The ligamentum flavum was taken down and removed to expose the lateral aspect of the thecal sac.  Annulotomy was performed and I remove the all the disc material at L2-3.  I then took my nerve hook and swept underneath the thecal sac to deliver 3 large fragments of disc serial that had herniated out.  This disc material was consistent with what was seen on the preoperative MRI.  Once this was done the L2 nerve root and the L3 nerve root was now easily mobile and there were no longer under compression.  I could freely pass my [facility] underneath the thecal sac working superiorly and inferiorly centrally and in the lateral recess.  In addition because of the central decompression there was no residual central stenosis or lateral recess or foraminal stenosis on this left side.  With the disc herniation out I continued with the removal of the disc material from the intervertebral space.  Using  angled curettes and pituitary rongeurs I remove the disc material.  I then used my trial spacers and elected use the size 9 expandable cage.  Once I had an adequate discectomy and I could visualize bleeding subchondral bone bilaterally I irrigated the wound copiously with normal saline.  I then placed a mixture of autograft bone and osteocell into the intervertebral space to serve as a sentinel fusion.  The implant was then [facility] into place and expanded to approximately height of 13 mm.  Imaging studies confirmed satisfactory position in both the AP and lateral planes.  The implant was then backfilled with a combination of autograft and osteocell allograft.  The inserting device was removed.  Imaging studies confirmed satisfactory position of the intervertebral cage.  I also could see that it was within the disc space in its entirety.  The polyaxial screw heads were then attached in the right to 3 facet capsule was taken down and decorticated with a high-speed bur.  The remainder  of the autograft bone was placed in the posterior lateral gutter and into the right L2-3 facet complex.  A 45 mm length rod was obtained and secured into place, and the end caps were tightened down.  This was repeated on the contralateral side.  All locking caps were then torqued according manufacture standards.    Hemostasis was confirmed and maintained with bipolar electrocautery and Floseal.  Retractors were then removed and the wound was closed in a layered fashion with interrupted #1 Vicryl suture, 0 Vicryl suture, 2-0 Vicryl suture, and 3-0 Monocryl for the skin.  Steri-Strips and a dry dressing were applied.  Final x-rays were taken which demonstrate satisfactory position of the hardware and the intervertebral cage.  No complicating features were noted.  Patient was ultimately extubated and transferred the PACU without incident.   Venita Lick, MD 05/05/2021 4:36 PM

## 2021-05-05 NOTE — Anesthesia Procedure Notes (Addendum)
Procedure Name: Intubation Date/Time: 05/05/2021 12:00 PM Performed by: Michele Rockers, CRNA Pre-anesthesia Checklist: Patient identified, Patient being monitored, Timeout performed, Emergency Drugs available and Suction available Patient Re-evaluated:Patient Re-evaluated prior to induction Oxygen Delivery Method: Circle System Utilized Preoxygenation: Pre-oxygenation with 100% oxygen Induction Type: Rapid sequence and Cricoid Pressure applied Ventilation: Mask ventilation without difficulty Laryngoscope Size: Mac and 3 Grade View: Grade I Tube type: Oral Tube size: 7.0 mm Number of attempts: 1 Airway Equipment and Method: Stylet Placement Confirmation: ETT inserted through vocal cords under direct vision,  positive ETCO2 and breath sounds checked- equal and bilateral Secured at: 21 cm Tube secured with: Tape Dental Injury: Teeth and Oropharynx as per pre-operative assessment

## 2021-05-05 NOTE — H&P (Signed)
Addendum H&P: Patient presents with ongoing back buttock and radicular left leg pain.  We have gone over the surgical procedure which would be a transforaminal lumbar interbody fusion of L2-3.  She is expressed an understanding of the risks including increased wound healing complication and poor results due to her morbid obesity.  We have gone over all other risks and benefits and she is expressed understanding as well as a willingness to continue to move forward with surgery.  There has been no change in her clinical exam since her last office visit of 04/26/2021.

## 2021-05-05 NOTE — Transfer of Care (Signed)
Immediate Anesthesia Transfer of Care Note  Patient: Tisheena G Lafever  Procedure(s) Performed: TRANSFORAMINAL LUMBAR INTERBODY FUSION (TLIF) WITH PEDICLE SCREW FIXATION 1 LEVEL L2-3 (N/A Spine Lumbar)  Patient Location: PACU  Anesthesia Type:General  Level of Consciousness: drowsy, patient cooperative and responds to stimulation  Airway & Oxygen Therapy: Patient Spontanous Breathing  Post-op Assessment: Report given to RN and Post -op Vital signs reviewed and stable  Post vital signs: Reviewed and stable  Last Vitals:  Vitals Value Taken Time  BP 135/80 05/05/21 1710  Temp    Pulse 101 05/05/21 1713  Resp 16 05/05/21 1713  SpO2 100 % 05/05/21 1713  Vitals shown include unvalidated device data.  Last Pain:  Vitals:   05/05/21 1034  TempSrc:   PainSc: 6       Patients Stated Pain Goal: 3 (05/05/21 1034)  Complications: No complications documented.

## 2021-05-06 ENCOUNTER — Encounter (HOSPITAL_COMMUNITY): Payer: Self-pay | Admitting: Orthopedic Surgery

## 2021-05-06 MED ORDER — ASPIRIN EC 81 MG PO TBEC: 81.0000 mg | DELAYED_RELEASE_TABLET | Freq: Every day | ORAL | 2 refills | Status: DC

## 2021-05-06 MED FILL — Thrombin (Recombinant) For Soln 20000 Unit: CUTANEOUS | Qty: 1 | Status: AC

## 2021-05-06 NOTE — Evaluation (Signed)
Physical Therapy Evaluation Patient Details Name: Robin [name] MRN: [id number] DOB: Apr 28, 1971 Today's Date: 05/06/2021   History of Present Illness  50 y/o female s/p transforaminal lumbar fusion L2-3 on 5/12. PMH: OSA, HTN, HLD, GERD, anxiety  Clinical Impression  PTA, patient lives with husband and used RW for ambulation. Patient overall functioning at supervision level for mobility. Educated patient on back precautions and progressive walking program upon discharge. Patient motivated to be more active. No skilled PT needs required acutely. No PT follow up recommended at this time. PT will sign off.     Follow Up Recommendations No PT follow up    Equipment Recommendations  None recommended by PT    Recommendations for Other Services       Precautions / Restrictions Precautions Precautions: Back Precaution Booklet Issued: Yes (comment) Required Braces or Orthoses: Spinal Brace Spinal Brace: Lumbar corset Restrictions Weight Bearing Restrictions: No      Mobility  Bed Mobility Overal bed mobility: Needs Assistance Bed Mobility: Sit to Sidelying;Rolling Rolling: Supervision       Sit to sidelying: Supervision General bed mobility comments: supervision for safety. Good recall of log roll technique from OT session    Transfers Overall transfer level: Needs assistance Equipment used: Rolling Neha Waight (2 wheeled) Transfers: Sit to/from Stand Sit to Stand: Supervision         General transfer comment: supervision for safety  Ambulation/Gait Ambulation/Gait assistance: Supervision Gait Distance (Feet): 300 Feet Assistive device: Rolling Hajira Verhagen (2 wheeled) Gait Pattern/deviations: Step-through pattern;Decreased stride length Gait velocity: decreased      Stairs Stairs: Yes Stairs assistance: Supervision Stair Management: No rails;Step to pattern;Forwards Number of Stairs: 1    Wheelchair Mobility    Modified Rankin (Stroke Patients Only)        Balance Overall balance assessment: No apparent balance deficits (not formally assessed)                                           Pertinent Vitals/Pain Pain Assessment: Faces Faces Pain Scale: Hurts even more Pain Location: back Pain Descriptors / Indicators: Operative site guarding;Grimacing Pain Intervention(s): Monitored during session;Repositioned    Home Living Family/patient expects to be discharged to:: Private residence Living Arrangements: Spouse/significant other Available Help at Discharge: Family;Available 24 hours/day Type of Home: House Home Access: Stairs to enter Entrance Stairs-Rails: None Entrance Stairs-Number of Steps: 2 Home Layout: One level Home Equipment: Environmental consultant - 2 wheels      Prior Function Level of Independence: Needs assistance   Gait / Transfers Assistance Needed: uses RW for ambulation due to back and L LE pain           Hand Dominance        Extremity/Trunk Assessment   Upper Extremity Assessment Upper Extremity Assessment: Defer to OT evaluation    Lower Extremity Assessment Lower Extremity Assessment: Generalized weakness (B knee pain and weakness at baseline, awaiting knee surgery)    Cervical / Trunk Assessment Cervical / Trunk Assessment: Normal  Communication   Communication: No difficulties  Cognition Arousal/Alertness: Awake/alert Behavior During Therapy: WFL for tasks assessed/performed Overall Cognitive Status: Within Functional Limits for tasks assessed                                        General Comments  Exercises     Assessment/Plan    PT Assessment Patent does not need any further PT services  PT Problem List         PT Treatment Interventions      PT Goals (Current goals can be found in the Care Plan section)  Acute Rehab PT Goals Patient Stated Goal: to move better PT Goal Formulation: With patient    Frequency     Barriers to discharge         Co-evaluation               AM-PAC PT "6 Clicks" Mobility  Outcome Measure Help needed turning from your back to your side while in a flat bed without using bedrails?: A Little Help needed moving from lying on your back to sitting on the side of a flat bed without using bedrails?: A Little Help needed moving to and from a bed to a chair (including a wheelchair)?: A Little Help needed standing up from a chair using your arms (e.g., wheelchair or bedside chair)?: A Little Help needed to walk in hospital room?: A Little Help needed climbing 3-5 steps with a railing? : A Little 6 Click Score: 18    End of Session Equipment Utilized During Treatment: Back brace Activity Tolerance: Patient tolerated treatment well Patient left: in bed;with call bell/phone within reach Nurse Communication: Mobility status PT Visit Diagnosis: Muscle weakness (generalized) (M62.81)    Time: 6759-1638 PT Time Calculation (min) (ACUTE ONLY): 27 min   Charges:   PT Evaluation $PT Eval Low Complexity: 1 Low PT Treatments $Therapeutic Activity: 8-22 mins        Derica Leiber A. Dan Humphreys PT, DPT Acute Rehabilitation Services Pager [phone] Office [phone]   Viviann Spare 05/06/2021, 9:10 AM

## 2021-05-06 NOTE — Progress Notes (Signed)
Patient walked down with a wheelchair for transport to be discharged home; complaints of minor aches and pains but otherwise was tolerable; all belongings checked and accounted for and was brought along by husband; discharge instructions given by RN to patient and her husband and they both verbalized understanding on the instructions given.

## 2021-05-06 NOTE — Evaluation (Signed)
Occupational Therapy Evaluation Patient Details Name: Robin [name] MRN: [id number] DOB: 1971-06-28 Today's Date: 05/06/2021    History of Present Illness 50 y/o female s/p transforaminal lumbar fusion L2-3 on 5/12. PMH: OSA, HTN, HLD, GERD, anxiety   Clinical Impression   PTA, pt was living with her husband and was performing ADLs and using RW for mobility. Currently, pt requires Supervision-Min guard for ADLs and functional mobility using RW. Provided education and handouts on back precautions, grooming, brace management, bed mobility, LB ADLs with AE, toileting with AE, and tub transfer; pt demonstrated understanding. Pt with increased fear of falling during tub transfer and noting weakness at R knee with simulated tub transfer; discussed use of tub bench transfer for safety. Answered all pt questions. Recommend dc home once medically stable per physician. All acute OT needs met and will sign off. Thank you.    Follow Up Recommendations  No OT follow up;Supervision - Intermittent    Equipment Recommendations  None recommended by OT;Other (comment) (Recommending tub bench but pt would liek to wait and purchase on her own)    Recommendations for Other Services PT consult     Precautions / Restrictions Precautions Precautions: Back Precaution Booklet Issued: Yes (comment) Precaution Comments: Reviewed back precautions and compensatoery techniques Required Braces or Orthoses: Spinal Brace Spinal Brace: Lumbar corset Restrictions Weight Bearing Restrictions: No      Mobility Bed Mobility Overal bed mobility: Needs Assistance Bed Mobility: Rolling;Sidelying to Sit Rolling: Min assist Sidelying to sit: Min assist       General bed mobility comments: Min A for log roll technique and elevating trunk    Transfers Overall transfer level: Needs assistance Equipment used: Rolling walker (2 wheeled) Transfers: Sit to/from Stand Sit to Stand: Supervision          General transfer comment: supervision for safety    Balance Overall balance assessment: No apparent balance deficits (not formally assessed)                                         ADL either performed or assessed with clinical judgement   ADL Overall ADL's : Needs assistance/impaired                                       General ADL Comments: Pt performing ADLs and functional mobility at [employer] A level with AE. Providing education on back rpecautions, brace management, bed mobility, grooming, LB ADLs with AE, toileting with AE, and tub transfer. Pt performing simulated tub transfer; however, very fearful of falling and requiring Min A for balance. Recommending tub transfer bench. Pt wanting to wait and order it herself for price and as she feels she will increased her strength with a couple days     Vision         Perception     Praxis      Pertinent Vitals/Pain Pain Assessment: Faces Faces Pain Scale: Hurts even more Pain Location: back Pain Descriptors / Indicators: Operative site guarding;Grimacing Pain Intervention(s): Monitored during session;Repositioned     Hand Dominance     Extremity/Trunk Assessment Upper Extremity Assessment Upper Extremity Assessment: Defer to OT evaluation   Lower Extremity Assessment Lower Extremity Assessment: Generalized weakness (B knee pain and weakness at baseline, awaiting knee surgery)   Cervical / Trunk  Assessment Cervical / Trunk Assessment: Other exceptions Cervical / Trunk Exceptions: S/p back sx. Increased body habitus   Communication Communication Communication: No difficulties   Cognition Arousal/Alertness: Awake/alert Behavior During Therapy: WFL for tasks assessed/performed Overall Cognitive Status: Within Functional Limits for tasks assessed                                     General Comments  Providing handouts on AE and how to purchase     Exercises     Shoulder Instructions      Home Living Family/patient expects to be discharged to:: Private residence Living Arrangements: Spouse/significant other Available Help at Discharge: Family;Available 24 hours/day Type of Home: House Home Access: Stairs to enter [employer] of Steps: 2 Entrance Stairs-Rails: None Home Layout: One level     Bathroom Shower/Tub: Tub/shower unit         Home Equipment: Environmental consultant - 2 wheels          Prior Functioning/Environment Level of Independence: Independent with assistive device(s)  Gait / Transfers Assistance Needed: uses RW for ambulation due to back and L LE pain. Performing BADLs with increased effort              OT Problem List: Decreased range of motion;Impaired balance (sitting and/or standing);Decreased knowledge of use of DME or AE;Decreased knowledge of precautions;Pain      OT Treatment/Interventions:      OT Goals(Current goals can be found in the care plan section) Acute Rehab OT Goals Patient Stated Goal: "walk and lookse this weight" OT Goal Formulation: All assessment and education complete, DC therapy  OT Frequency:     Barriers to D/C:            Co-evaluation              AM-PAC OT "6 Clicks" Daily Activity     Outcome Measure Help from another person eating meals?: None Help from another person taking care of personal grooming?: A Little Help from another person toileting, which includes using toliet, bedpan, or urinal?: A Little Help from another person bathing (including washing, rinsing, drying)?: A Little Help from another person to put on and taking off regular upper body clothing?: A Little Help from another person to put on and taking off regular lower body clothing?: A Little 6 Click Score: 19   End of Session Equipment Utilized During Treatment: Rolling walker;Back brace Nurse Communication: Mobility status  Activity Tolerance: Patient tolerated treatment  well Patient left:  (in hallway with PT)  OT Visit Diagnosis: Unsteadiness on feet (R26.81);Other abnormalities of gait and mobility (R26.89);Muscle weakness (generalized) (M62.81)                Time: 0511-0211 OT Time Calculation (min): 24 min Charges:  OT General Charges $OT Visit: 1 Visit OT Evaluation $OT Eval Low Complexity: 1 Low OT Treatments $Self Care/Home Management : 8-22 mins  Kaloni Bisaillon MSOT, OTR/L Acute Rehab Pager: [phone] Office: [place] 05/06/2021, 9:24 AM

## 2021-05-06 NOTE — Anesthesia Postprocedure Evaluation (Signed)
Anesthesia Post Note  Patient: Robin [name]  Procedure(s) Performed: TRANSFORAMINAL LUMBAR INTERBODY FUSION (TLIF) WITH PEDICLE SCREW FIXATION 1 LEVEL L2-3 (N/A Spine Lumbar)     Patient location during evaluation: PACU Anesthesia Type: General Level of consciousness: awake and alert Pain management: pain level controlled Vital Signs Assessment: post-procedure vital signs reviewed and stable Respiratory status: spontaneous breathing, nonlabored ventilation, respiratory function stable and patient connected to nasal cannula oxygen Cardiovascular status: blood pressure returned to baseline and stable Postop Assessment: no apparent nausea or vomiting Anesthetic complications: no   No complications documented.  Last Vitals:  Vitals:   05/06/21 0512 05/06/21 0758  BP: 126/76 (!) 141/83  Pulse: 83 91  Resp: 18 18  Temp: 36.9 C 37 C  SpO2: 96% 97%    Last Pain:  Vitals:   05/06/21 0800  TempSrc:   PainSc: 3                  [name]

## 2021-05-06 NOTE — Anesthesia Preprocedure Evaluation (Signed)
Anesthesia Evaluation  Patient identified by MRN, date of birth, ID band Patient awake    Reviewed: Allergy & Precautions, NPO status , Patient's Chart, lab work & pertinent test results  Airway Mallampati: II  TM Distance: >3 FB     Dental   Pulmonary former smoker,    breath sounds clear to auscultation       Cardiovascular hypertension,  Rhythm:Regular Rate:Normal     Neuro/Psych    GI/Hepatic Neg liver ROS, GERD  ,  Endo/Other  negative endocrine ROS  Renal/GU negative Renal ROS     Musculoskeletal  (+) Arthritis ,   Abdominal   Peds  Hematology   Anesthesia Other Findings   Reproductive/Obstetrics                             Anesthesia Physical Anesthesia Plan  ASA: III  Anesthesia Plan: General   Post-op Pain Management:    Induction: Intravenous  PONV Risk Score and Plan: 3 and Ondansetron, Dexamethasone and Midazolam  Airway Management Planned: Oral ETT  Additional Equipment:   Intra-op Plan:   Post-operative Plan: Extubation in OR  Informed Consent: I have reviewed the patients History and Physical, chart, labs and discussed the procedure including the risks, benefits and alternatives for the proposed anesthesia with the patient or authorized representative who has indicated his/her understanding and acceptance.     Dental advisory given  Plan Discussed with: CRNA  Anesthesia Plan Comments:         Anesthesia Quick Evaluation

## 2021-05-06 NOTE — Progress Notes (Signed)
    Subjective: Procedure(s) (LRB): TRANSFORAMINAL LUMBAR INTERBODY FUSION (TLIF) WITH PEDICLE SCREW FIXATION 1 LEVEL L2-3 (N/A) 1 Day Post-Op  Patient reports pain as 2 on 0-10 scale.  Reports decreased leg pain reports incisional back pain   Positive void Positive bowel movement Positive flatus Negative chest pain or shortness of breath  Objective: Vital signs in last 24 hours: Temp:  [97 F (36.1 C)-98.7 F (37.1 C)] 98.6 F (37 C) (05/13 0758) Pulse Rate:  [83-102] 91 (05/13 0758) Resp:  [11-20] 18 (05/13 0758) BP: (108-173)/(58-88) 141/83 (05/13 0758) SpO2:  [96 %-100 %] 97 % (05/13 0758) Weight:  [136.1 kg] 136.1 kg (05/12 1020)  Intake/Output from previous day: 05/12 0701 - 05/13 0700 In: 3090 [P.O.:240; I.V.:2000; IV Piggyback:850] Out: 700 [Urine:450; Blood:250]  Labs: Recent Labs    05/03/21 1500  WBC 8.8  RBC 5.25*  HCT 45.3  PLT 290   Recent Labs    05/03/21 1500  NA 140  K 3.4*  CL 103  CO2 26  BUN 9  CREATININE 0.77  GLUCOSE 96  CALCIUM 9.0   Recent Labs    05/03/21 1500  INR 1.1    Physical Exam: Neurologically intact ABD soft Intact pulses distally Incision: dressing C/D/I and no drainage Compartment soft Body mass index is 44.3 kg/m.   Assessment/Plan: Patient stable  xrays n/a Continue mobilization with physical therapy Continue care  1.  Patient is doing exceptionally well status post TLIF L2-3. 2.  Plan on discharge to home today.  Patient will follow up with me in 2 weeks for reevaluation. 3.  Pain medication and aspirin as DVT prevention was provided for the patient.  I have also counseled her on active range of motion exercise lower extremity and ambulation.  Venita Lick, MD [facility] [phone]

## 2021-05-16 NOTE — Discharge Summary (Signed)
Patient ID: Robin [name] MRN: [id number] DOB/AGE: 07/14/1971 50 y.o.  Admit date: 05/05/2021 Discharge date: 05/16/2021  Admission Diagnoses:  Active Problems:   S/P lumbar fusion   Discharge Diagnoses:  Active Problems:   S/P lumbar fusion  status post Procedure(s): TRANSFORAMINAL LUMBAR INTERBODY FUSION (TLIF) WITH PEDICLE SCREW FIXATION 1 LEVEL L2-3  Past Medical History:  Diagnosis Date  . Allergy   . Anxiety   . Asthma   . Depression   . GERD (gastroesophageal reflux disease)   . Hyperlipidemia   . Hypertension   . OSA (obstructive sleep apnea)   . Rheumatoid arthritis (HCC)     Surgeries: Procedure(s): TRANSFORAMINAL LUMBAR INTERBODY FUSION (TLIF) WITH PEDICLE SCREW FIXATION 1 LEVEL L2-3 on 05/05/2021   Consultants:   Discharged Condition: Improved  Hospital Course: Robin [name] is an 50 y.o. female who was admitted 05/05/2021 for operative treatment of L2-3 degenerative disc disease with disc hernation. Patient failed conservative treatments (please see the history and physical for the specifics) and had severe unremitting pain that affects sleep, daily activities and work/hobbies. After pre-op clearance, the patient was taken to the operating room on 05/05/2021 and underwent  Procedure(s): TRANSFORAMINAL LUMBAR INTERBODY FUSION (TLIF) WITH PEDICLE SCREW FIXATION 1 LEVEL L2-3.    Patient was given perioperative antibiotics:  Anti-infectives (From admission, onward)   Start     Dose/Rate Route Frequency Ordered Stop   05/05/21 2330  ceFAZolin (ANCEF) IVPB 2g/100 mL premix        2 g 200 mL/hr over 30 Minutes Intravenous Every 8 hours 05/05/21 1841 05/06/21 0553   05/05/21 1013  ceFAZolin (ANCEF) IVPB 3g/100 mL premix  Status:  Discontinued        3 g 200 mL/hr over 30 Minutes Intravenous 30 min pre-op 05/05/21 1013 05/05/21 1832       Patient was given sequential compression devices and early ambulation to prevent DVT.   Patient benefited  maximally from hospital stay and there were no complications. At the time of discharge, the patient was urinating/moving their bowels without difficulty, tolerating a regular diet, pain is controlled with oral pain medications and they have been cleared by PT/OT.   Recent vital signs: No data found.   Recent laboratory studies: No results for input(s): WBC, HGB, HCT, PLT, NA, K, CL, CO2, BUN, CREATININE, GLUCOSE, INR, CALCIUM in the last 72 hours.  Invalid input(s): PT, 2   Discharge Medications:   Allergies as of 05/06/2021      Reactions   Lisinopril Cough      Medication List    STOP taking these medications   cephALEXin 500 MG capsule Commonly known as: KEFLEX   diphenhydramine-acetaminophen 25-500 MG Tabs tablet Commonly known as: TYLENOL PM   gabapentin 300 MG capsule Commonly known as: NEURONTIN   HYDROcodone-acetaminophen 5-325 MG tablet Commonly known as: NORCO/VICODIN   methocarbamol 500 MG tablet Commonly known as: ROBAXIN   oxyCODONE-acetaminophen 10-325 MG tablet Commonly known as: PERCOCET   pregabalin 75 MG capsule Commonly known as: LYRICA   promethazine 25 MG tablet Commonly known as: PHENERGAN   traMADol 50 MG tablet Commonly known as: ULTRAM     TAKE these medications   acidophilus Caps capsule Take by mouth daily.   amLODipine 10 MG tablet Commonly known as: NORVASC Take 10 mg by mouth daily.   aspirin EC 81 MG tablet Take 1 tablet (81 mg total) by mouth daily. Start on POD #2   atorvastatin 20 MG tablet Commonly known  as: LIPITOR Take 20 mg by mouth daily.   buPROPion 300 MG 24 hr tablet Commonly known as: WELLBUTRIN XL Take 300 mg by mouth daily.   docusate sodium 100 MG capsule Commonly known as: COLACE Take 100 mg by mouth daily.   escitalopram 10 MG tablet Commonly known as: LEXAPRO Take 10 mg by mouth daily.   hydrALAZINE 25 MG tablet Commonly known as: APRESOLINE Take 25 mg by mouth in the morning, at noon, and at  bedtime.   hydrochlorothiazide 25 MG tablet Commonly known as: HYDRODIURIL Take 25 mg by mouth daily.   omeprazole 20 MG tablet Commonly known as: PRILOSEC OTC Take 20 mg by mouth daily.   ondansetron 4 MG tablet Commonly known as: Zofran Take 1 tablet (4 mg total) by mouth every 8 (eight) hours as needed for nausea or vomiting.   Potassium 99 MG Tabs Take 99 mg by mouth daily.     ASK your doctor about these medications   methocarbamol 500 MG tablet Commonly known as: Robaxin Take 1 tablet (500 mg total) by mouth every 8 (eight) hours as needed for up to 5 days for muscle spasms. Ask about: Should I take this medication?   oxyCODONE-acetaminophen 10-325 MG tablet Commonly known as: Percocet Take 1 tablet by mouth every 6 (six) hours as needed for up to 5 days for pain. Ask about: Should I take this medication?       Diagnostic Studies: DG Lumbar Spine 2-3 Views  Result Date: 05/05/2021 CLINICAL DATA:  Elective surgery. EXAM: LUMBAR SPINE - 2-3 VIEW; DG C-ARM 1-60 MIN COMPARISON:  None. FINDINGS: Four fluoroscopic spot views of the lumbar spine obtained in the operating room in frontal and lateral projections. Posterior rod with intrapedicular screws and interbody spacer. Levels are difficult to delineate on these common views, however appears to be L2-L3. Total fluoroscopy time 3 minutes 14 seconds. Total dose 179.21 mGy. IMPRESSION: Intraoperative fluoroscopy during lumbar fusion. Electronically Signed   By: Narda Rutherford M.D.   On: 05/05/2021 16:41   DG C-Arm 1-60 Min  Result Date: 05/05/2021 CLINICAL DATA:  Elective surgery. EXAM: LUMBAR SPINE - 2-3 VIEW; DG C-ARM 1-60 MIN COMPARISON:  None. FINDINGS: Four fluoroscopic spot views of the lumbar spine obtained in the operating room in frontal and lateral projections. Posterior rod with intrapedicular screws and interbody spacer. Levels are difficult to delineate on these common views, however appears to be L2-L3. Total  fluoroscopy time 3 minutes 14 seconds. Total dose 179.21 mGy. IMPRESSION: Intraoperative fluoroscopy during lumbar fusion. Electronically Signed   By: Narda Rutherford M.D.   On: 05/05/2021 16:41    Discharge Instructions    Incentive spirometry RT   Complete by: As directed        Follow-up Information    Venita Lick, MD. Schedule an appointment as soon as possible for a visit in 2 weeks.   Specialty: Orthopedic Surgery Why: If symptoms worsen, For suture removal, For wound re-check, As needed Contact information: [address] [place] [address] [phone]               Discharge Plan:  discharge to home  Disposition: stable    Signed: Rhodia Albright for [facility] PA-C [facility] [phone] 05/16/2021, 8:34 AM

## 2021-06-01 DIAGNOSIS — M1712 Unilateral primary osteoarthritis, left knee: Secondary | ICD-10-CM | POA: Insufficient documentation

## 2021-06-13 ENCOUNTER — Encounter (HOSPITAL_COMMUNITY): Payer: Self-pay | Admitting: Orthopedic Surgery

## 2021-06-13 ENCOUNTER — Ambulatory Visit: Payer: Self-pay | Admitting: Orthopedic Surgery

## 2021-06-13 ENCOUNTER — Other Ambulatory Visit: Payer: Self-pay

## 2021-06-13 NOTE — Progress Notes (Signed)
Robin [name] denies chest or shortness of breath.  Patient denies s/s of Covid in her home and no exposure to her knowledge.

## 2021-06-13 NOTE — H&P (Deleted)
  The note originally documented on this encounter has been moved the the encounter in which it belongs.  

## 2021-06-13 NOTE — H&P (Signed)
Subjective:  For the patient is having, patient reports mild pain. For the patient is here today for follow-up/Check incision, patient reports the patient is 5 weeks out from their surgery Lumbar transforaminal interbody fusion.For recent pt, patient reports none. For the patient, patient reports is using a brace/support (lso). CC: check incision. Denies discharge, drainage. Denies fevers/sweats/chill.  Patient Active Problem List   Diagnosis Date Noted   S/P lumbar fusion 05/05/2021   Degeneration of lumbar intervertebral disc 05/14/2020   Sarcoidosis 05/10/2020   Lumbar spondylosis 03/24/2020   Pain of left hip joint 03/24/2020   Cervical spondylosis without myelopathy 07/16/2019   Neck pain 07/16/2019   Obesity 10/18/2018   Pain in right knee 10/18/2018   Hammertoe of right foot 07/02/2018   OSA (obstructive sleep apnea) 10/26/2014   Past Medical History:  Diagnosis Date   Allergy    Anxiety    Asthma    Depression    GERD (gastroesophageal reflux disease)    Hyperlipidemia    Hypertension    OSA (obstructive sleep apnea)    Rheumatoid arthritis (HCC)     Past Surgical History:  Procedure Laterality Date   ABDOMINAL HYSTERECTOMY     ESOPHAGOGASTRODUODENOSCOPY     FOOT SURGERY Bilateral    Both surgeries for bunions by Dr. Wynelle Cleveland per pt   TRANSFORAMINAL LUMBAR INTERBODY FUSION (TLIF) WITH PEDICLE SCREW FIXATION 1 LEVEL N/A 05/05/2021   Procedure: TRANSFORAMINAL LUMBAR INTERBODY FUSION (TLIF) WITH PEDICLE SCREW FIXATION 1 LEVEL L2-3;  Surgeon: Venita Lick, MD;  Location: [facility] OR;  Service: Orthopedics;  Laterality: N/A;  4 hrs    Current Outpatient Medications  Medication Sig Dispense Refill Last Dose   acidophilus (RISAQUAD) CAPS capsule Take by mouth daily.      amLODipine (NORVASC) 10 MG tablet Take 10 mg by mouth daily.  2    aspirin EC 81 MG tablet Take 1 tablet (81 mg total) by mouth daily. Start on POD #2 30 tablet 2    atorvastatin (LIPITOR) 20 MG tablet Take  20 mg by mouth daily.      buPROPion (WELLBUTRIN XL) 300 MG 24 hr tablet Take 300 mg by mouth daily.      docusate sodium (COLACE) 100 MG capsule Take 100 mg by mouth daily.      escitalopram (LEXAPRO) 10 MG tablet Take 10 mg by mouth daily.      hydrALAZINE (APRESOLINE) 25 MG tablet Take 25 mg by mouth in the morning, at noon, and at bedtime.      hydrochlorothiazide (HYDRODIURIL) 25 MG tablet Take 25 mg by mouth daily.      omeprazole (PRILOSEC OTC) 20 MG tablet Take 20 mg by mouth daily.      ondansetron (ZOFRAN) 4 MG tablet Take 1 tablet (4 mg total) by mouth every 8 (eight) hours as needed for nausea or vomiting. 20 tablet 0    Potassium 99 MG TABS Take 99 mg by mouth daily.      No current facility-administered medications for this visit.   Allergies  Allergen Reactions   Fluconazole     Other reaction(s): caused" spacey sensation"   Lisinopril Cough   Zoloft [Sertraline]     Other reaction(s): depression worsened    Social History   Tobacco Use   Smoking status: Former    Packs/day: 0.10    Years: 2.00    Pack years: 0.20    Types: Cigarettes    Quit date: 12/25/1996    Years since quitting: 24.4  Smokeless tobacco: Never   Tobacco comments:    smoked 2-3 cigs daily x 2 years  Substance Use Topics   Alcohol use: Yes    Alcohol/week: 0.0 standard drinks    Comment: rarely    No family history on file.  Review of Systems Pertinent items are noted in HPI.  Objective:   Clinical exam: She returns today following L2-3 fusion (TLIF)  Pain level/location: No significant back or radicular leg pain.  Neuro exam: Intact with no focal neurological deficits.  Patient is ambulating with: No assistive devices.  Incision: Positive superficial dehiscence of the wound. No active drainage or erythema or purulent material.  Compartments in the lower extremity are: Soft and nontender  Abdomen soft and nontender. Normal bowel and bladder function.  No shortness of breath or chest  pain.  Patient is alert and oriented 3    Lumbar x-rays taken today in the office (AP/lateral/spot lateral) were reviewed: Satisfactory position of the hardware. No subsidence or migration.   Assessment:   Robin [name] returns today for follow-up - 5 weeks out from her L2-3 fusion. She noted over the last week that the wound has separated. She has had no fevers chills or any significant event to cause this.    Plan:   At this point time I placed her on a 7 day antibiotic course. I plan on returning to the operating room to revise the scar and close it to prevent infection from occurring. I have gone over the surgical procedure with her in great detail and all of her questions were addressed.

## 2021-06-14 NOTE — Anesthesia Preprocedure Evaluation (Addendum)
Anesthesia Evaluation  Patient identified by MRN, date of birth, ID band Patient awake    Reviewed: Allergy & Precautions, NPO status , Patient's Chart, lab work & pertinent test results  Airway Mallampati: III  TM Distance: >3 FB Neck ROM: Full    Dental  (+) Chipped, Missing,    Pulmonary asthma , sleep apnea and Continuous Positive Airway Pressure Ventilation , former smoker,    Pulmonary exam normal breath sounds clear to auscultation       Cardiovascular hypertension, Pt. on medications Normal cardiovascular exam Rhythm:Regular Rate:Normal  ECG: NSR, rate 80   Neuro/Psych PSYCHIATRIC DISORDERS Anxiety Depression negative neurological ROS     GI/Hepatic Neg liver ROS, GERD  Medicated and Controlled,  Endo/Other  Morbid obesity  Renal/GU negative Renal ROS     Musculoskeletal negative musculoskeletal ROS (+)   Abdominal (+) + obese,   Peds  Hematology HLD   Anesthesia Other Findings Wound dehiscence  Reproductive/Obstetrics                            Anesthesia Physical Anesthesia Plan  ASA: 3  Anesthesia Plan: General   Post-op Pain Management:    Induction: Intravenous  PONV Risk Score and Plan: 3 and Ondansetron, Dexamethasone, Midazolam and Treatment may vary due to age or medical condition  Airway Management Planned: Oral ETT  Additional Equipment:   Intra-op Plan:   Post-operative Plan: Extubation in OR  Informed Consent: I have reviewed the patients History and Physical, chart, labs and discussed the procedure including the risks, benefits and alternatives for the proposed anesthesia with the patient or authorized representative who has indicated his/her understanding and acceptance.     Dental advisory given  Plan Discussed with: CRNA  Anesthesia Plan Comments: (Reviewed PAT note written 06/14/2021 by Shonna Chock, PA-C. )       Anesthesia Quick  Evaluation

## 2021-06-14 NOTE — Progress Notes (Addendum)
Anesthesia Chart Review: Robin [name]  Case: [id number] Date/Time: 06/15/21 0715   Procedure: Irrigation and debridement and closure of spine wound   Anesthesia type: General   Pre-op diagnosis: Wound dehiscence   [facility] RO[facility] 04 / [facility]   Surgeons: Venita Lick, MD       DISCUSSION: Patient is a 50 year old female scheduled for the above procedure. She is s/p L2-3 transforaminal lumber interbody fusion/posterolateral arthrodesis on 05/05/21. At follow-up she was noted to have superficial wound dehiscence without active drainage or erythema. She was started on a 7 day course of antibiotics with plans to return to OR to revise scar the close the wound to prevent infection from occurring.   Other history includes former smoker (quit 12/25/96), HTN, HLD, GERD, asthma, OSA (non-compliant with CPAP), RA, hysterectomy (10/11/09). Possible Sarcoidosis (on imaging, but no biopsy) is also listed in primary care note from [facility].   She is a same-day work-up, so labs as indicated and anesthesia team evaluation on the day of surgery.  VS: BP Readings from Last 3 Encounters:  05/06/21 (!) 141/83  05/03/21 (!) 140/100  12/10/17 (!) 166/97   Pulse Readings from Last 3 Encounters:  05/06/21 91  05/03/21 99  12/10/17 72     PROVIDERS: Lorenda Ishihara, MD is PCP [facility]   LABS: For labs on the day of surgery as  indicated. As of 05/03/21, H/H 14.3/45.3, PLT 290, Cr 0.77, glucose 96. AST 22, ALT 20 04/19/21 and A1c 5.5% 04/21/20 ([facility]).   IMAGES: CXR 12/01/20: FINDINGS: The heart size and mediastinal contours are within normal limits. Both lungs are clear. The visualized skeletal structures are unremarkable. IMPRESSION: No active cardiopulmonary disease.    EKG: 04/19/21 EKG ([facility]; scanned under Media tab, Correspondence, Enc 05/05/21): Sinus rhythm. RSR prime V1, non-diagnostic. Left atrial enlargement.   CV: N/A   Past Medical  History:  Diagnosis Date   Allergy    Anxiety    Asthma    Depression    GERD (gastroesophageal reflux disease)    Hyperlipidemia    Hypertension    OSA (obstructive sleep apnea)    Pneumonia    Rheumatoid arthritis (HCC)     Past Surgical History:  Procedure Laterality Date   ABDOMINAL HYSTERECTOMY     ESOPHAGOGASTRODUODENOSCOPY     FOOT SURGERY Bilateral    Both surgeries for bunions by Dr. Wynelle Cleveland per pt   TRANSFORAMINAL LUMBAR INTERBODY FUSION (TLIF) WITH PEDICLE SCREW FIXATION 1 LEVEL N/A 05/05/2021   Procedure: TRANSFORAMINAL LUMBAR INTERBODY FUSION (TLIF) WITH PEDICLE SCREW FIXATION 1 LEVEL L2-3;  Surgeon: Venita Lick, MD;  Location: [facility] OR;  Service: Orthopedics;  Laterality: N/A;  4 hrs    MEDICATIONS: No current facility-administered medications for this encounter.    acetaminophen (TYLENOL) 500 MG tablet   albuterol (VENTOLIN HFA) 108 (90 Base) MCG/ACT inhaler   amLODipine (NORVASC) 10 MG tablet   aspirin EC 81 MG tablet   atorvastatin (LIPITOR) 20 MG tablet   buPROPion (WELLBUTRIN XL) 300 MG 24 hr tablet   celecoxib (CELEBREX) 200 MG capsule   cephALEXin (KEFLEX) 500 MG capsule   escitalopram (LEXAPRO) 10 MG tablet   hydrALAZINE (APRESOLINE) 25 MG tablet   Melatonin 10 MG CAPS   nystatin cream (MYCOSTATIN)   omeprazole (PRILOSEC OTC) 20 MG tablet   Potassium 99 MG TABS   ondansetron (ZOFRAN) 4 MG tablet    Shonna Chock, PA-C Surgical Short Stay/Anesthesiology [facility] Phone [phone]  [facility] Phone [phone] 06/14/2021 9:31 AM

## 2021-06-15 ENCOUNTER — Ambulatory Visit (HOSPITAL_COMMUNITY): Payer: 59 | Admitting: Vascular Surgery

## 2021-06-15 ENCOUNTER — Other Ambulatory Visit: Payer: Self-pay

## 2021-06-15 ENCOUNTER — Encounter (HOSPITAL_COMMUNITY): Payer: Self-pay | Admitting: Orthopedic Surgery

## 2021-06-15 ENCOUNTER — Ambulatory Visit (HOSPITAL_COMMUNITY)
Admission: RE | Admit: 2021-06-15 | Discharge: 2021-06-15 | Disposition: A | Discharge status: Home / Self Care | Payer: 59 | Attending: Orthopedic Surgery | Admitting: Orthopedic Surgery

## 2021-06-15 ENCOUNTER — Encounter (HOSPITAL_COMMUNITY)
Admission: RE | Disposition: A | Discharge status: Home / Self Care | Payer: Self-pay | Source: Home / Self Care | Attending: Orthopedic Surgery

## 2021-06-15 DIAGNOSIS — I10 Essential (primary) hypertension: Secondary | ICD-10-CM | POA: Diagnosis not present

## 2021-06-15 DIAGNOSIS — Z9119 Patient's noncompliance with other medical treatment and regimen: Secondary | ICD-10-CM | POA: Diagnosis not present

## 2021-06-15 DIAGNOSIS — J45909 Unspecified asthma, uncomplicated: Secondary | ICD-10-CM | POA: Diagnosis not present

## 2021-06-15 DIAGNOSIS — T8131XA Disruption of external operation (surgical) wound, not elsewhere classified, initial encounter: Secondary | ICD-10-CM | POA: Insufficient documentation

## 2021-06-15 DIAGNOSIS — Z791 Long term (current) use of non-steroidal anti-inflammatories (NSAID): Secondary | ICD-10-CM | POA: Insufficient documentation

## 2021-06-15 DIAGNOSIS — Z7982 Long term (current) use of aspirin: Secondary | ICD-10-CM | POA: Diagnosis not present

## 2021-06-15 DIAGNOSIS — M069 Rheumatoid arthritis, unspecified: Secondary | ICD-10-CM | POA: Insufficient documentation

## 2021-06-15 DIAGNOSIS — G4733 Obstructive sleep apnea (adult) (pediatric): Secondary | ICD-10-CM | POA: Diagnosis not present

## 2021-06-15 DIAGNOSIS — Y838 Other surgical procedures as the cause of abnormal reaction of the patient, or of later complication, without mention of misadventure at the time of the procedure: Secondary | ICD-10-CM | POA: Diagnosis not present

## 2021-06-15 DIAGNOSIS — E785 Hyperlipidemia, unspecified: Secondary | ICD-10-CM | POA: Diagnosis not present

## 2021-06-15 DIAGNOSIS — Z87891 Personal history of nicotine dependence: Secondary | ICD-10-CM | POA: Diagnosis not present

## 2021-06-15 DIAGNOSIS — Z79899 Other long term (current) drug therapy: Secondary | ICD-10-CM | POA: Diagnosis not present

## 2021-06-15 DIAGNOSIS — K219 Gastro-esophageal reflux disease without esophagitis: Secondary | ICD-10-CM | POA: Diagnosis not present

## 2021-06-15 HISTORY — DX: Pneumonia, unspecified organism: J18.9

## 2021-06-15 HISTORY — PX: DEBRIDEMENT AND CLOSURE WOUND: SHX5614

## 2021-06-15 LAB — URINALYSIS, ROUTINE W REFLEX MICROSCOPIC
Bilirubin Urine: NEGATIVE
Glucose, UA: NEGATIVE mg/dL
Hgb urine dipstick: NEGATIVE
Ketones, ur: NEGATIVE mg/dL
Nitrite: NEGATIVE
Protein, ur: NEGATIVE mg/dL
Specific Gravity, Urine: 1.021 (ref 1.005–1.030)
pH: 6 (ref 5.0–8.0)

## 2021-06-15 LAB — CBC
HCT: 40.8 % (ref 36.0–46.0)
Hemoglobin: 12.6 g/dL (ref 12.0–15.0)
MCH: 26.9 pg (ref 26.0–34.0)
MCHC: 30.9 g/dL (ref 30.0–36.0)
MCV: 87 fL (ref 80.0–100.0)
Platelets: 305 10*3/uL (ref 150–400)
RBC: 4.69 MIL/uL (ref 3.87–5.11)
RDW: 13.1 % (ref 11.5–15.5)
WBC: 10.4 10*3/uL (ref 4.0–10.5)
nRBC: 0 % (ref 0.0–0.2)

## 2021-06-15 LAB — BASIC METABOLIC PANEL
Anion gap: 9 (ref 5–15)
BUN: 12 mg/dL (ref 6–20)
CO2: 23 mmol/L (ref 22–32)
Calcium: 8.8 mg/dL — ABNORMAL LOW (ref 8.9–10.3)
Chloride: 107 mmol/L (ref 98–111)
Creatinine, Ser: 0.77 mg/dL (ref 0.44–1.00)
GFR, Estimated: >60 mL/min (ref >60)
Glucose, Bld: 100 mg/dL — ABNORMAL HIGH (ref 70–99)
Potassium: 3.2 mmol/L — ABNORMAL LOW (ref 3.5–5.1)
Sodium: 139 mmol/L (ref 135–145)

## 2021-06-15 LAB — PROTIME-INR
INR: 1.2 (ref 0.8–1.2)
Prothrombin Time: 14.9 seconds (ref 11.4–15.2)

## 2021-06-15 LAB — APTT: aPTT: 27 seconds (ref 24–36)

## 2021-06-15 SURGERY — DEBRIDEMENT, WOUND, WITH CLOSURE
Anesthesia: General

## 2021-06-15 MED ORDER — SODIUM CHLORIDE 0.9 % IR SOLN
Status: DC | PRN
  Administered 2021-06-15: 1000 mL

## 2021-06-15 MED ORDER — CEFAZOLIN SODIUM 1 G IJ SOLR
INTRAMUSCULAR | Status: AC
  Filled 2021-06-15: qty 10

## 2021-06-15 MED ORDER — PROMETHAZINE HCL 25 MG/ML IJ SOLN: 6.2500 mg | INTRAMUSCULAR | Status: DC | PRN

## 2021-06-15 MED ORDER — DEXAMETHASONE SODIUM PHOSPHATE 10 MG/ML IJ SOLN
INTRAMUSCULAR | Status: DC | PRN
  Administered 2021-06-15: 10 mg via INTRAVENOUS

## 2021-06-15 MED ORDER — ROCURONIUM BROMIDE 10 MG/ML (PF) SYRINGE
PREFILLED_SYRINGE | INTRAVENOUS | Status: DC | PRN
  Administered 2021-06-15: 60 mg via INTRAVENOUS

## 2021-06-15 MED ORDER — OXYCODONE HCL 5 MG/5ML PO SOLN: 5.0000 mg | Freq: Once | ORAL | Status: DC | PRN

## 2021-06-15 MED ORDER — ONDANSETRON HCL 4 MG/2ML IJ SOLN
INTRAMUSCULAR | Status: AC
  Filled 2021-06-15: qty 2

## 2021-06-15 MED ORDER — BUPIVACAINE LIPOSOME 1.3 % IJ SUSP
INTRAMUSCULAR | Status: AC
  Filled 2021-06-15: qty 20

## 2021-06-15 MED ORDER — DEXAMETHASONE SODIUM PHOSPHATE 10 MG/ML IJ SOLN
INTRAMUSCULAR | Status: AC
  Filled 2021-06-15: qty 1

## 2021-06-15 MED ORDER — ONDANSETRON HCL 4 MG PO TABS: 4.0000 mg | ORAL_TABLET | Freq: Three times a day (TID) | ORAL | 0 refills | Status: DC | PRN

## 2021-06-15 MED ORDER — AMISULPRIDE (ANTIEMETIC) 5 MG/2ML IV SOLN: 10.0000 mg | Freq: Once | INTRAVENOUS | Status: DC | PRN

## 2021-06-15 MED ORDER — CHLORHEXIDINE GLUCONATE 0.12 % MT SOLN
15.0000 mL | Freq: Once | OROMUCOSAL | Status: AC
  Administered 2021-06-15: 15 mL via OROMUCOSAL
  Filled 2021-06-15: qty 15

## 2021-06-15 MED ORDER — FENTANYL CITRATE (PF) 250 MCG/5ML IJ SOLN
INTRAMUSCULAR | Status: DC | PRN
  Administered 2021-06-15: 50 ug via INTRAVENOUS
  Administered 2021-06-15: 100 ug via INTRAVENOUS

## 2021-06-15 MED ORDER — BUPIVACAINE-EPINEPHRINE 0.25% -1:200000 IJ SOLN
INTRAMUSCULAR | Status: DC | PRN
  Administered 2021-06-15: 30 mL

## 2021-06-15 MED ORDER — SODIUM CHLORIDE 0.9 % IV SOLN: INTRAVENOUS | Status: DC | PRN

## 2021-06-15 MED ORDER — PROPOFOL 10 MG/ML IV BOLUS
INTRAVENOUS | Status: DC | PRN
  Administered 2021-06-15: 170 mg via INTRAVENOUS

## 2021-06-15 MED ORDER — OXYCODONE HCL 5 MG PO TABS: 5.0000 mg | ORAL_TABLET | Freq: Once | ORAL | Status: DC | PRN

## 2021-06-15 MED ORDER — ONDANSETRON HCL 4 MG/2ML IJ SOLN
INTRAMUSCULAR | Status: DC | PRN
  Administered 2021-06-15: 4 mg via INTRAVENOUS

## 2021-06-15 MED ORDER — CEFAZOLIN SODIUM-DEXTROSE 2-4 GM/100ML-% IV SOLN
2.0000 g | INTRAVENOUS | Status: AC
  Administered 2021-06-15: 3 g via INTRAVENOUS
  Filled 2021-06-15: qty 100

## 2021-06-15 MED ORDER — ACETAMINOPHEN 10 MG/ML IV SOLN
INTRAVENOUS | Status: AC
  Filled 2021-06-15: qty 100

## 2021-06-15 MED ORDER — ACETAMINOPHEN 10 MG/ML IV SOLN
INTRAVENOUS | Status: DC | PRN
  Administered 2021-06-15: 1000 mg via INTRAVENOUS

## 2021-06-15 MED ORDER — MIDAZOLAM HCL 2 MG/2ML IJ SOLN
INTRAMUSCULAR | Status: AC
  Filled 2021-06-15: qty 2

## 2021-06-15 MED ORDER — FENTANYL CITRATE (PF) 100 MCG/2ML IJ SOLN: 25.0000 ug | INTRAMUSCULAR | Status: DC | PRN

## 2021-06-15 MED ORDER — LACTATED RINGERS IV SOLN: INTRAVENOUS | Status: DC

## 2021-06-15 MED ORDER — ROCURONIUM BROMIDE 10 MG/ML (PF) SYRINGE
PREFILLED_SYRINGE | INTRAVENOUS | Status: AC
  Filled 2021-06-15: qty 10

## 2021-06-15 MED ORDER — PROPOFOL 10 MG/ML IV BOLUS
INTRAVENOUS | Status: AC
  Filled 2021-06-15: qty 20

## 2021-06-15 MED ORDER — FENTANYL CITRATE (PF) 250 MCG/5ML IJ SOLN
INTRAMUSCULAR | Status: AC
  Filled 2021-06-15: qty 5

## 2021-06-15 MED ORDER — MIDAZOLAM HCL 2 MG/2ML IJ SOLN
INTRAMUSCULAR | Status: DC | PRN
  Administered 2021-06-15: 2 mg via INTRAVENOUS

## 2021-06-15 MED ORDER — SUGAMMADEX SODIUM 200 MG/2ML IV SOLN
INTRAVENOUS | Status: DC | PRN
  Administered 2021-06-15: 100 mg via INTRAVENOUS
  Administered 2021-06-15: 400 mg via INTRAVENOUS

## 2021-06-15 MED ORDER — BUPIVACAINE-EPINEPHRINE (PF) 0.25% -1:200000 IJ SOLN
INTRAMUSCULAR | Status: AC
  Filled 2021-06-15: qty 30

## 2021-06-15 MED ORDER — LIDOCAINE 2% (20 MG/ML) 5 ML SYRINGE
INTRAMUSCULAR | Status: AC
  Filled 2021-06-15: qty 5

## 2021-06-15 MED ORDER — ACETAMINOPHEN 10 MG/ML IV SOLN: 1000.0000 mg | Freq: Once | INTRAVENOUS | Status: DC | PRN

## 2021-06-15 MED ORDER — ORAL CARE MOUTH RINSE: 15.0000 mL | Freq: Once | OROMUCOSAL | Status: AC

## 2021-06-15 MED ORDER — LIDOCAINE 2% (20 MG/ML) 5 ML SYRINGE
INTRAMUSCULAR | Status: DC | PRN
  Administered 2021-06-15: 100 mg via INTRAVENOUS

## 2021-06-15 MED ORDER — HYDROCODONE-ACETAMINOPHEN 5-325 MG PO TABS: 1.0000 | ORAL_TABLET | Freq: Four times a day (QID) | ORAL | 0 refills | Status: AC | PRN

## 2021-06-15 SURGICAL SUPPLY — 59 items
BAG DECANTER FOR FLEXI CONT (MISCELLANEOUS) ×2 IMPLANT
BNDG COHESIVE 4X5 TAN STRL (GAUZE/BANDAGES/DRESSINGS) IMPLANT
BNDG ELASTIC 4X5.8 VLCR STR LF (GAUZE/BANDAGES/DRESSINGS) IMPLANT
BNDG GAUZE ELAST 4 BULKY (GAUZE/BANDAGES/DRESSINGS) ×2 IMPLANT
CLSR STERI-STRIP ANTIMIC 1/2X4 (GAUZE/BANDAGES/DRESSINGS) ×2 IMPLANT
COVER SURGICAL LIGHT HANDLE (MISCELLANEOUS) ×2 IMPLANT
COVER WAND RF STERILE (DRAPES) ×2 IMPLANT
CUFF TOURN SGL QUICK 18X4 (TOURNIQUET CUFF) ×2 IMPLANT
CUFF TOURN SGL QUICK 24 (TOURNIQUET CUFF)
CUFF TOURN SGL QUICK 34 (TOURNIQUET CUFF)
CUFF TOURN SGL QUICK 42 (TOURNIQUET CUFF) IMPLANT
CUFF TRNQT CYL 24X4X16.5-23 (TOURNIQUET CUFF) IMPLANT
CUFF TRNQT CYL 34X4.125X (TOURNIQUET CUFF) IMPLANT
DRAPE INCISE 23X17 IOBAN STRL (DRAPES) ×1
DRAPE INCISE IOBAN 23X17 STRL (DRAPES) ×1 IMPLANT
DRAPE LAPAROTOMY 100X72 PEDS (DRAPES) ×2 IMPLANT
DRAPE UNIVERSAL PACK (DRAPES) ×2 IMPLANT
DRSG EMULSION OIL 3X3 NADH (GAUZE/BANDAGES/DRESSINGS) ×2 IMPLANT
DRSG OPSITE POSTOP 4X10 (GAUZE/BANDAGES/DRESSINGS) ×2 IMPLANT
DRSG PAD ABDOMINAL 8X10 ST (GAUZE/BANDAGES/DRESSINGS) IMPLANT
ELECT PENCIL ROCKER SW 15FT (MISCELLANEOUS) ×2 IMPLANT
ELECT REM PT RETURN 9FT ADLT (ELECTROSURGICAL)
ELECTRODE REM PT RTRN 9FT ADLT (ELECTROSURGICAL) IMPLANT
GAUZE SPONGE 4X4 12PLY STRL (GAUZE/BANDAGES/DRESSINGS) ×2 IMPLANT
GAUZE XEROFORM 5X9 LF (GAUZE/BANDAGES/DRESSINGS) ×2 IMPLANT
GLOVE SURG ENC MOIS LTX SZ6.5 (GLOVE) ×2 IMPLANT
GLOVE SURG MICRO LTX SZ8.5 (GLOVE) ×2 IMPLANT
GLOVE SURG UNDER POLY LF SZ6.5 (GLOVE) ×2 IMPLANT
GLOVE SURG UNDER POLY LF SZ8.5 (GLOVE) ×2 IMPLANT
GOWN STRL REUS W/ TWL LRG LVL3 (GOWN DISPOSABLE) ×1 IMPLANT
GOWN STRL REUS W/TWL 2XL LVL3 (GOWN DISPOSABLE) ×4 IMPLANT
GOWN STRL REUS W/TWL LRG LVL3 (GOWN DISPOSABLE) ×2
HANDPIECE INTERPULSE COAX TIP (DISPOSABLE)
KIT BASIN OR (CUSTOM PROCEDURE TRAY) ×2 IMPLANT
KIT TURNOVER KIT B (KITS) ×2 IMPLANT
MANIFOLD NEPTUNE II (INSTRUMENTS) ×2 IMPLANT
NEEDLE 22X1 1/2 (OR ONLY) (NEEDLE) ×2 IMPLANT
NEEDLE HYPO 25GX1X1/2 BEV (NEEDLE) ×2 IMPLANT
NS IRRIG 1000ML POUR BTL (IV SOLUTION) ×2 IMPLANT
PACK ORTHO EXTREMITY (CUSTOM PROCEDURE TRAY) IMPLANT
PAD ARMBOARD 7.5X6 YLW CONV (MISCELLANEOUS) ×4 IMPLANT
POWDER MYRIAD MORCELLS 500MG (Miscellaneous) ×2 IMPLANT
SET HNDPC FAN SPRY TIP SCT (DISPOSABLE) IMPLANT
SPONGE LAP 18X18 RF (DISPOSABLE) ×2 IMPLANT
SPONGE LAP 4X18 RFD (DISPOSABLE) ×2 IMPLANT
STOCKINETTE IMPERVIOUS 9X36 MD (GAUZE/BANDAGES/DRESSINGS) ×2 IMPLANT
SURGIFLO W/THROMBIN 8M KIT (HEMOSTASIS) IMPLANT
SUT BONE WAX W31G (SUTURE) ×2 IMPLANT
SUT ETHILON 4 0 PS 2 18 (SUTURE) ×8 IMPLANT
SUT VIC AB 2-0 CT1 18 (SUTURE) ×2 IMPLANT
SWAB COLLECTION DEVICE MRSA (MISCELLANEOUS) IMPLANT
SWAB CULTURE ESWAB REG 1ML (MISCELLANEOUS) IMPLANT
SYR CONTROL 10ML LL (SYRINGE) ×2 IMPLANT
TOWEL GREEN STERILE (TOWEL DISPOSABLE) ×2 IMPLANT
TOWEL GREEN STERILE FF (TOWEL DISPOSABLE) ×2 IMPLANT
TUBE CONNECTING 12X1/4 (SUCTIONS) ×2 IMPLANT
UNDERPAD 30X36 HEAVY ABSORB (UNDERPADS AND DIAPERS) ×2 IMPLANT
WATER STERILE IRR 1000ML POUR (IV SOLUTION) ×2 IMPLANT
YANKAUER SUCT BULB TIP NO VENT (SUCTIONS) ×2 IMPLANT

## 2021-06-15 NOTE — Op Note (Signed)
OPERATIVE REPORT  DATE OF SURGERY: 06/15/2021  PATIENT NAME:  Robin [name] MRN: [id number] DOB: [birth year]  PCP: Lorenda Ishihara, MD  PRE-OPERATIVE DIAGNOSIS: Wound dehiscence  POST-OPERATIVE DIAGNOSIS: Same  PROCEDURE:   Revision of posterior surgical wound  SURGEON:  Venita Lick, MD  PHYSICIAN ASSISTANT: Voncille Lo, PA  ANESTHESIA:   General  EBL: Minimal  Implant 500 cc of morcells wound healing powder  BRIEF HISTORY: Robin [name] is a 50 y.o. female who is now 6 weeks out from a TLIF at L2-3.  Postoperatively she was doing well until she started noticing the wound opening up.  While there was no evidence of gross infection I did have concerns that if not addressed it would become a wound infection.  Patient had a dehiscence most likely due to her body habitus.  After discussing treatment options we elected to bring her back to the operating room for a formal revision of the wound and primary closure.  All appropriate risks, benefits, alternatives to surgery were discussed with the patient and consent was obtained  PROCEDURE DETAILS: Patient was brought into the operating room. After successful induction of general anesthesia and endotracheal intubation a Time Out was done. This confirmed all pertinent important data.  Patient was turned prone onto the Wilson frame and all bony prominences were well-padded.  The back was prepped and draped in the standard fashion.  The prior surgical wound was then opened by just applying pressure to the wound.  The deep fascia was still secure.  The wound edges were freshened with a 15 blade scalpel and we had bleeding wound edges.  There was no purulent material or drainage noted.  The wound was copiously irrigated with normal saline.  I then administered approximately 30 cc of quarter percent Marcaine with epinephrine and Exparel for postoperative analgesia.  In order to aid in wound healing the morcells powder was  inserted into the wound.  Then using a 0 PDS suture a running vertical mattress suture was placed in order to reapproximate the skin.  We had a secure wound closure.  A dry dressing was applied and the patient was extubated transfer the PACU without incident.  The end of the case all needle and sponge counts were correct.  Venita Lick, MD 06/15/2021 9:06 AM

## 2021-06-15 NOTE — Brief Op Note (Signed)
06/15/2021  9:11 AM  PATIENT:  Robin [name]  50 y.o. female  PRE-OPERATIVE DIAGNOSIS:  Wound dehiscence  POST-OPERATIVE DIAGNOSIS:  Wound dehiscence  PROCEDURE:  Procedure(s): Irrigation and debridement and closure of spine wound (N/A)  SURGEON:  Surgeon(s) and Role:    Venita Lick, MD - Primary  PHYSICIAN ASSISTANT:   ASSISTANTS: Voncille Lo, PA   ANESTHESIA:   general  EBL:  minimal  BLOOD ADMINISTERED:none  DRAINS: none   LOCAL MEDICATIONS USED:  MARCAINE    and OTHER exparel  SPECIMEN:  No Specimen  DISPOSITION OF SPECIMEN:  N/A  COUNTS:  YES  TOURNIQUET:  * No tourniquets in log *  DICTATION: .Dragon Dictation  PLAN OF CARE: Discharge to home after PACU  PATIENT DISPOSITION:  PACU - hemodynamically stable.

## 2021-06-15 NOTE — Anesthesia Postprocedure Evaluation (Signed)
Anesthesia Post Note  Patient: Robin [name]  Procedure(s) Performed: Irrigation and debridement and closure of spine wound     Patient location during evaluation: PACU Anesthesia Type: General Level of consciousness: awake Pain management: pain level controlled Vital Signs Assessment: post-procedure vital signs reviewed and stable Respiratory status: spontaneous breathing, nonlabored ventilation, respiratory function stable and patient connected to nasal cannula oxygen Cardiovascular status: blood pressure returned to baseline and stable Postop Assessment: no apparent nausea or vomiting Anesthetic complications: no   No notable events documented.  Last Vitals:  Vitals:   06/15/21 0940 06/15/21 0955  BP: 139/81 140/73  Pulse: 73 73  Resp: 13 17  Temp:  36.7 C  SpO2: 98% 97%    Last Pain:  Vitals:   06/15/21 0955  TempSrc:   PainSc: 0-No pain                 Garielle Mroz P Sukhman Martine

## 2021-06-15 NOTE — H&P (Signed)
Addendum H&P: Patient is now approximately 6 weeks out from a L2-3 fusion and unfortunately developed a wound dehiscence.  There is no gross evidence of infection.  After discussing treatment options we elected to return to the operating room to revise the incision.  This would entail debriding it and then closing it in order to prevent infection.  I reviewed the risks and benefits and alternatives with the patient and all her questions were addressed.  There is been no change in her clinical exam since her last office visit of 06/13/2021.

## 2021-06-15 NOTE — Anesthesia Procedure Notes (Signed)
Procedure Name: Intubation Date/Time: 06/15/2021 8:17 AM Performed by: Shary Decamp, CRNA Pre-anesthesia Checklist: Patient identified, Patient being monitored, Timeout performed, Emergency Drugs available and Suction available Patient Re-evaluated:Patient Re-evaluated prior to induction Oxygen Delivery Method: Circle system utilized Preoxygenation: Pre-oxygenation with 100% oxygen Induction Type: IV induction Ventilation: Mask ventilation without difficulty Laryngoscope Size: Miller and 2 Grade View: Grade I Tube type: Oral Tube size: 7.5 mm Number of attempts: 1 Airway Equipment and Method: Stylet Placement Confirmation: ETT inserted through vocal cords under direct vision, positive ETCO2 and breath sounds checked- equal and bilateral Secured at: 22 cm Tube secured with: Tape Dental Injury: Teeth and Oropharynx as per pre-operative assessment

## 2021-06-15 NOTE — Transfer of Care (Signed)
Immediate Anesthesia Transfer of Care Note  Patient: Ilani G Rockhill  Procedure(s) Performed: Irrigation and debridement and closure of spine wound  Patient Location: PACU  Anesthesia Type:General  Level of Consciousness: awake, alert , patient cooperative and responds to stimulation  Airway & Oxygen Therapy: Patient Spontanous Breathing and Patient connected to face mask oxygen  Post-op Assessment: Report given to RN, Post -op Vital signs reviewed and stable and Patient moving all extremities X 4  Post vital signs: Reviewed and stable  Last Vitals:  Vitals Value Taken Time  BP    Temp    Pulse 80 06/15/21 0926  Resp 13 06/15/21 0926  SpO2 99 % 06/15/21 0926  Vitals shown include unvalidated device data.  Last Pain:  Vitals:   06/15/21 0655  TempSrc:   PainSc: 0-No pain         Complications: No notable events documented.

## 2021-06-16 ENCOUNTER — Encounter (HOSPITAL_COMMUNITY): Payer: Self-pay | Admitting: Orthopedic Surgery

## 2022-01-30 ENCOUNTER — Ambulatory Visit: Payer: Self-pay | Admitting: Internal Medicine

## 2022-01-30 ENCOUNTER — Other Ambulatory Visit: Payer: Self-pay

## 2022-01-30 ENCOUNTER — Encounter: Payer: Self-pay | Admitting: Internal Medicine

## 2022-01-30 VITALS — BP 180/118 | HR 80 | Resp 20 | Ht 69.0 in | Wt 309.0 lb

## 2022-01-30 DIAGNOSIS — F32A Depression, unspecified: Secondary | ICD-10-CM

## 2022-01-30 DIAGNOSIS — Z5986 Financial insecurity: Secondary | ICD-10-CM

## 2022-01-30 DIAGNOSIS — M0609 Rheumatoid arthritis without rheumatoid factor, multiple sites: Secondary | ICD-10-CM

## 2022-01-30 DIAGNOSIS — I10 Essential (primary) hypertension: Secondary | ICD-10-CM

## 2022-01-30 DIAGNOSIS — Z6841 Body Mass Index (BMI) 40.0 and over, adult: Secondary | ICD-10-CM

## 2022-01-30 DIAGNOSIS — E7800 Pure hypercholesterolemia, unspecified: Secondary | ICD-10-CM | POA: Insufficient documentation

## 2022-01-30 DIAGNOSIS — F419 Anxiety disorder, unspecified: Secondary | ICD-10-CM | POA: Insufficient documentation

## 2022-01-30 MED ORDER — GABAPENTIN 100 MG PO CAPS: ORAL_CAPSULE | ORAL | 11 refills | Status: DC

## 2022-01-30 MED ORDER — LOSARTAN POTASSIUM-HCTZ 50-12.5 MG PO TABS: 1.0000 | ORAL_TABLET | Freq: Every day | ORAL | 11 refills | Status: DC

## 2022-01-30 MED ORDER — AMLODIPINE BESYLATE 10 MG PO TABS: 10.0000 mg | ORAL_TABLET | Freq: Every day | ORAL | 11 refills | Status: AC

## 2022-01-30 MED ORDER — ALBUTEROL SULFATE HFA 108 (90 BASE) MCG/ACT IN AERS: 2.0000 | INHALATION_SPRAY | Freq: Four times a day (QID) | RESPIRATORY_TRACT | 2 refills | Status: AC | PRN

## 2022-01-30 MED ORDER — ATORVASTATIN CALCIUM 20 MG PO TABS: 20.0000 mg | ORAL_TABLET | Freq: Every day | ORAL | 11 refills | Status: AC

## 2022-01-30 MED ORDER — PREDNISONE 20 MG PO TABS: ORAL_TABLET | ORAL | 2 refills | Status: DC

## 2022-01-30 MED ORDER — HUMIRA 40 MG/0.8ML ~~LOC~~ PSKT: PREFILLED_SYRINGE | SUBCUTANEOUS | 6 refills | Status: DC
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
## 2022-01-30 NOTE — Patient Instructions (Addendum)
[facility]  Address: [address], [place], [place] [address]  Phone: [phone] Please walk into the clinic between 9 a.m. and 1 p.m. Mon-Fri to establish Virtual visits are available   Gabapentin: Start taking Gabapentin 100 mg cap 1 cap at bedtime. In 3 days, increase to 2 caps at bedtime. In another 3 days, increase to 3 caps at bedtime You should be taking 3 caps at bedtime at this point.  In 3 days, start another 1 cap in the morning In 3 more days, increase to 2 caps in the morning In 3 more days, increase to 3 caps in the morning:  You should be taking 3 caps in the morning and 3 caps at bedtime at this point.  In 3 days, start another dose midday--1 cap In 3 more days, increase to 2 caps midday In 3 more days, increase to 3 caps midday. You should be taking 3 caps 3 times daily at this point.  Stay on this dose until follow up If you do not tolerate increasing the dose at any point, hold on the dose you tolerate or call clinic if having problems   Make small doable goals every week with diet and physical activity  Drink a glass of water before every meal Drink 6-8 glasses of water daily Eat three meals daily Eat a protein and healthy fat with every meal (eggs,fish, chicken, [place] and limit red meats) Eat 5 servings of vegetables daily, mix the colors Eat 2 servings of fruit daily with skin, if skin is edible Use smaller plates Put food/utensils down as you chew and swallow each bite Eat at a table with friends/family at least once daily, no TV Do not eat in front of the TV  Recent studies show that people who consume all of their calories in a 12 hour period lose weight more efficiently.  For example, if you eat your first meal at 7:00 a.m., your last meal of the day should be completed by 7:00 p.m.

## 2022-01-31 LAB — CBC WITH DIFFERENTIAL/PLATELET
Basophils Absolute: 0 10*3/uL (ref 0.0–0.2)
Basos: 0 %
EOS (ABSOLUTE): 0.3 10*3/uL (ref 0.0–0.4)
Eos: 3 %
Hematocrit: 42.7 % (ref 34.0–46.6)
Hemoglobin: 13.5 g/dL (ref 11.1–15.9)
Immature Grans (Abs): 0 10*3/uL (ref 0.0–0.1)
Immature Granulocytes: 0 %
Lymphocytes Absolute: 4.2 10*3/uL — ABNORMAL HIGH (ref 0.7–3.1)
Lymphs: 44 %
MCH: 26.8 pg (ref 26.6–33.0)
MCHC: 31.6 g/dL (ref 31.5–35.7)
MCV: 85 fL (ref 79–97)
Monocytes Absolute: 0.8 10*3/uL (ref 0.1–0.9)
Monocytes: 8 %
Neutrophils Absolute: 4.3 10*3/uL (ref 1.4–7.0)
Neutrophils: 45 %
Platelets: 312 10*3/uL (ref 150–450)
RBC: 5.03 x10E6/uL (ref 3.77–5.28)
RDW: 11.8 % (ref 11.7–15.4)
WBC: 9.6 10*3/uL (ref 3.4–10.8)

## 2022-01-31 LAB — COMPREHENSIVE METABOLIC PANEL
ALT: 14 IU/L (ref 0–32)
AST: 16 IU/L (ref 0–40)
Albumin/Globulin Ratio: 1.5 (ref 1.2–2.2)
Albumin: 4.1 g/dL (ref 3.8–4.9)
Alkaline Phosphatase: 125 IU/L — ABNORMAL HIGH (ref 44–121)
BUN/Creatinine Ratio: 11 (ref 9–23)
BUN: 10 mg/dL (ref 6–24)
Bilirubin Total: 0.2 mg/dL (ref 0.0–1.2)
CO2: 21 mmol/L (ref 20–29)
Calcium: 9.3 mg/dL (ref 8.7–10.2)
Chloride: 103 mmol/L (ref 96–106)
Creatinine, Ser: 0.94 mg/dL (ref 0.57–1.00)
Globulin, Total: 2.8 g/dL (ref 1.5–4.5)
Glucose: 90 mg/dL (ref 70–99)
Potassium: 3.8 mmol/L (ref 3.5–5.2)
Sodium: 140 mmol/L (ref 134–144)
Total Protein: 6.9 g/dL (ref 6.0–8.5)
eGFR: 73 mL/min/{1.73_m2} (ref >59)

## 2022-01-31 LAB — C-REACTIVE PROTEIN: CRP: 6 mg/L (ref 0–10)

## 2022-01-31 LAB — SEDIMENTATION RATE: Sed Rate: 37 mm/hr (ref 0–40)

## 2022-01-31 LAB — HGB A1C W/O EAG: Hgb A1c MFr Bld: 5.6 % (ref 4.8–5.6)

## 2022-02-13 ENCOUNTER — Telehealth: Payer: Self-pay | Admitting: Internal Medicine

## 2022-02-13 DIAGNOSIS — Z5986 Financial insecurity: Secondary | ICD-10-CM | POA: Insufficient documentation

## 2022-02-14 ENCOUNTER — Other Ambulatory Visit: Payer: Self-pay

## 2022-02-14 ENCOUNTER — Ambulatory Visit: Payer: Self-pay

## 2022-02-14 VITALS — BP 152/84 | HR 80

## 2022-02-14 DIAGNOSIS — Z0130 Encounter for examination of blood pressure without abnormal findings: Secondary | ICD-10-CM

## 2022-02-14 MED ORDER — LOSARTAN POTASSIUM-HCTZ 50-12.5 MG PO TABS: ORAL_TABLET | ORAL | 11 refills | Status: DC

## 2022-02-14 NOTE — Telephone Encounter (Signed)
Patient described that she has felt much better since taking the prednisone. Patient expressed that with the medication she has had reduced pains and has had the ability to do more physical activities (gym, get around, clean her house). Would like to talk more with Dr Delrae Alfred before coming off prednisone

## 2022-02-14 NOTE — Progress Notes (Signed)
Have her increase her Losartan/HCTZ to 2 tabs once daily. Return in 1 month for BMP and BP check

## 2022-02-15 NOTE — Progress Notes (Signed)
Patient reported that she has been taking bp medication consistently. Patient took medication the morning of her appointment.  Patient has been notified of medication changes

## 2022-02-15 NOTE — Telephone Encounter (Signed)
Discussed with patient long term side effects of prednisone and if exam and inflammatory markers are normal, do not want her on prednisone and causing those side effects.   She agrees to wean. She did sign ROI for Dr Marlou Sa records and would like for her to come in when available to sign for [facility] as well--apparently some of the work up done there before Dr. Lenetta Quaker.

## 2022-03-20 ENCOUNTER — Ambulatory Visit: Payer: Medicaid Other | Admitting: Internal Medicine

## 2022-03-20 ENCOUNTER — Other Ambulatory Visit: Payer: Self-pay

## 2022-03-20 ENCOUNTER — Encounter: Payer: Self-pay | Admitting: Internal Medicine

## 2022-03-20 VITALS — BP 154/92 | HR 84 | Resp 16 | Ht 69.0 in | Wt 317.0 lb

## 2022-03-20 DIAGNOSIS — E66813 Obesity, class 3: Secondary | ICD-10-CM

## 2022-03-20 DIAGNOSIS — M0609 Rheumatoid arthritis without rheumatoid factor, multiple sites: Secondary | ICD-10-CM

## 2022-03-20 DIAGNOSIS — I10 Essential (primary) hypertension: Secondary | ICD-10-CM

## 2022-03-20 MED ORDER — DICLOFENAC SODIUM 75 MG PO TBEC: DELAYED_RELEASE_TABLET | ORAL | 4 refills | Status: DC

## 2022-03-20 MED ORDER — CARVEDILOL 3.125 MG PO TABS: 3.1250 mg | ORAL_TABLET | Freq: Two times a day (BID) | ORAL | 11 refills | Status: DC

## 2022-03-20 NOTE — Progress Notes (Signed)
Subjective:    Patient ID: Robin [name], female   DOB: [birth year], 51 y.o.   MRN: [id number]   HPI   Hypertension:  states taking amlodipine and Losartan/HCTZ regularly.  Has been on increased dose Losartan/HCTZ for 1 month.  Has never taken a beta blocker.    2.   Reportedly RF negative RA:  CRP and Sed Rate in normal ranges last month with establish visit.  Has appt with Dr. Dimple Casey, Rheumatology in April.  She did wean back off prednisone for about 1 month.  Was restarted on Humira through MAP last week.  She is hurting in wrists, fingers, shoulders, knees, hips and right ankle.  She had her disability evaluation with physician over the weekend at [facility] office.  Xrays were done.  She states was told she needed knee replacement on right.  Apparently is also missing joint surface of medial clavicle, right.  She requested [facility] to be [facility]   [facility] for about 1-2 hours each time.    Current Meds  Medication Sig   Adalimumab (HUMIRA) 40 MG/0.8ML PSKT Inject 40 mg SQ every 2 weeks for rheumatoid arthritis   albuterol (VENTOLIN HFA) 108 (90 Base) MCG/ACT inhaler Inhale 2 puffs into the lungs every 6 (six) hours as needed for wheezing or shortness of breath.   amLODipine (NORVASC) 10 MG tablet Take 1 tablet (10 mg total) by mouth daily.   atorvastatin (LIPITOR) 20 MG tablet Take 1 tablet (20 mg total) by mouth daily.   buPROPion (WELLBUTRIN XL) 300 MG 24 hr tablet Take 300 mg by mouth daily.   escitalopram (LEXAPRO) 10 MG tablet Take 10 mg by mouth daily.   gabapentin (NEURONTIN) 100 MG capsule 1 cap by mouth at bedtime and increase by 100 mg every 3 days as tolerated until taking 300 mg 3 times daily   losartan-hydrochlorothiazide (HYZAAR) 50-12.5 MG tablet 2 tabs by mouth once daily in morning   Melatonin 10 MG CAPS Take 20 mg by mouth at bedtime as needed (sleep).   Allergies  Allergen Reactions   Lisinopril Cough      Review of Systems    Objective:   BP (!) 154/92 (BP Location: Right Arm, Patient Position: Sitting, Cuff Size: Large)   Pulse 84   Resp 16   Ht 5\' 9"  (1.753 m)   Wt (!) 317 lb (143.8 kg)   BMI 46.81 kg/m   Physical Exam NAD HEENT:  PERRL, EOMI, TMs pearly gray. Neck:  Supple, No adenopathy Chest:  CTA CV:  RRR without murmur or rub.  Radial and DP pulses normal and equal MS:  full ROM of wrists, fingers  No definite synovial thickening or effusion in any joint.  No erythema.   Assessment & Plan    Seronegative RA:  To see Dr. Dimple Casey, Rheum soon.  Will add Diclofenac 75 mg twice daily with meals to get discomfort under better control.  2.  Hypertension:  remains a bit high.  Add Carvedilol 3.125 mg twice daily.  BP and pulse check in 2 weeks..    3.  Obesity:  encouraged working on diet.  Consider water exercise to spare joints.    4.  HM:  CPE in 3 months to cover Health maintenance issues.

## 2022-03-20 NOTE — Patient Instructions (Signed)
Make goals every Monday that are doable for physical activity and diet ? ?Drink a glass of water before every meal ?Drink 6-8 glasses of water daily ?Eat three meals daily ?Eat a protein and healthy fat with every meal (eggs,fish, chicken, [place] and limit red meats) ?Eat 5 servings of vegetables daily, mix the colors ?Eat 2 servings of fruit daily with skin, if skin is edible ?Use smaller plates ?Put food/utensils down as you chew and swallow each bite ?Eat at a table with friends/family at least once daily, no TV ?Do not eat in front of the TV ? ?Recent studies show that people who consume all of their calories in a 12 hour period lose weight more efficiently.  For example, if you eat your first meal at 7:00 a.m., your last meal of the day should be completed by 7:00 p.m.  ?

## 2022-04-04 ENCOUNTER — Other Ambulatory Visit: Payer: Medicaid Other

## 2022-04-04 VITALS — BP 126/80 | HR 72

## 2022-04-04 DIAGNOSIS — Z0130 Encounter for examination of blood pressure without abnormal findings: Secondary | ICD-10-CM

## 2022-04-04 DIAGNOSIS — Z79899 Other long term (current) drug therapy: Secondary | ICD-10-CM

## 2022-04-04 NOTE — Progress Notes (Signed)
Patient reported that she is taking bp medication consistently. Patient took bp medication this morning. ? ?After reporting Bp to Dr Amil Amen, not changes will be made to medication. ? ?

## 2022-04-05 LAB — BASIC METABOLIC PANEL
BUN/Creatinine Ratio: 15 (ref 9–23)
BUN: 15 mg/dL (ref 6–24)
CO2: 25 mmol/L (ref 20–29)
Calcium: 9.5 mg/dL (ref 8.7–10.2)
Chloride: 105 mmol/L (ref 96–106)
Creatinine, Ser: 1.02 mg/dL — ABNORMAL HIGH (ref 0.57–1.00)
Glucose: 108 mg/dL — ABNORMAL HIGH (ref 70–99)
Potassium: 3.9 mmol/L (ref 3.5–5.2)
Sodium: 143 mmol/L (ref 134–144)
eGFR: 66 mL/min/{1.73_m2} (ref >59)

## 2022-04-05 MED ORDER — ESCITALOPRAM OXALATE 10 MG PO TABS: 10.0000 mg | ORAL_TABLET | Freq: Every day | ORAL | 11 refills | Status: DC

## 2022-04-05 MED ORDER — BUPROPION HCL ER (XL) 300 MG PO TB24: 300.0000 mg | ORAL_TABLET | Freq: Every day | ORAL | 11 refills | Status: DC

## 2022-04-06 ENCOUNTER — Other Ambulatory Visit: Payer: Self-pay

## 2022-04-06 MED ORDER — BUPROPION HCL ER (SR) 150 MG PO TB12: 150.0000 mg | ORAL_TABLET | Freq: Two times a day (BID) | ORAL | 11 refills | Status: DC

## 2022-04-19 ENCOUNTER — Encounter: Payer: Self-pay | Admitting: Internal Medicine

## 2022-04-19 ENCOUNTER — Other Ambulatory Visit (HOSPITAL_COMMUNITY)
Admission: RE | Admit: 2022-04-19 | Discharge: 2022-04-19 | Disposition: A | Discharge status: Home / Self Care | Payer: Medicaid Other | Attending: Internal Medicine | Admitting: Internal Medicine

## 2022-04-19 ENCOUNTER — Ambulatory Visit (INDEPENDENT_AMBULATORY_CARE_PROVIDER_SITE_OTHER): Payer: Self-pay | Admitting: Internal Medicine

## 2022-04-19 VITALS — BP 124/88 | HR 101 | Resp 16 | Ht 69.0 in | Wt 315.0 lb

## 2022-04-19 DIAGNOSIS — M51369 Other intervertebral disc degeneration, lumbar region without mention of lumbar back pain or lower extremity pain: Secondary | ICD-10-CM

## 2022-04-19 DIAGNOSIS — Z111 Encounter for screening for respiratory tuberculosis: Secondary | ICD-10-CM | POA: Insufficient documentation

## 2022-04-19 DIAGNOSIS — M1712 Unilateral primary osteoarthritis, left knee: Secondary | ICD-10-CM

## 2022-04-19 DIAGNOSIS — Z981 Arthrodesis status: Secondary | ICD-10-CM

## 2022-04-19 DIAGNOSIS — Z79899 Other long term (current) drug therapy: Secondary | ICD-10-CM

## 2022-04-19 DIAGNOSIS — M1711 Unilateral primary osteoarthritis, right knee: Secondary | ICD-10-CM

## 2022-04-19 DIAGNOSIS — M5136 Other intervertebral disc degeneration, lumbar region: Secondary | ICD-10-CM

## 2022-04-19 DIAGNOSIS — M0609 Rheumatoid arthritis without rheumatoid factor, multiple sites: Secondary | ICD-10-CM

## 2022-04-19 LAB — CBC WITH DIFFERENTIAL/PLATELET
Abs Immature Granulocytes: 0.06 10*3/uL (ref 0.00–0.07)
Basophils Absolute: 0.1 10*3/uL (ref 0.0–0.1)
Basophils Relative: 0 %
Eosinophils Absolute: 0.1 10*3/uL (ref 0.0–0.5)
Eosinophils Relative: 1 %
HCT: 44.7 % (ref 36.0–46.0)
Hemoglobin: 14.9 g/dL (ref 12.0–15.0)
Immature Granulocytes: 0 %
Lymphocytes Relative: 25 %
Lymphs Abs: 3.9 10*3/uL (ref 0.7–4.0)
MCH: 27.6 pg (ref 26.0–34.0)
MCHC: 33.3 g/dL (ref 30.0–36.0)
MCV: 82.8 fL (ref 80.0–100.0)
Monocytes Absolute: 1.5 10*3/uL — ABNORMAL HIGH (ref 0.1–1.0)
Monocytes Relative: 10 %
Neutro Abs: 9.9 10*3/uL — ABNORMAL HIGH (ref 1.7–7.7)
Neutrophils Relative %: 64 %
Platelets: 260 10*3/uL (ref 150–400)
RBC: 5.4 MIL/uL — ABNORMAL HIGH (ref 3.87–5.11)
RDW: 13 % (ref 11.5–15.5)
WBC: 15.6 10*3/uL — ABNORMAL HIGH (ref 4.0–10.5)
nRBC: 0 % (ref 0.0–0.2)

## 2022-04-19 LAB — COMPREHENSIVE METABOLIC PANEL
ALT: 28 U/L (ref 0–44)
AST: 26 U/L (ref 15–41)
Albumin: 3.5 g/dL (ref 3.5–5.0)
Alkaline Phosphatase: 96 U/L (ref 38–126)
Anion gap: 8 (ref 5–15)
BUN: 10 mg/dL (ref 6–20)
CO2: 24 mmol/L (ref 22–32)
Calcium: 9.1 mg/dL (ref 8.9–10.3)
Chloride: 105 mmol/L (ref 98–111)
Creatinine, Ser: 0.87 mg/dL (ref 0.44–1.00)
GFR, Estimated: >60 mL/min (ref >60)
Glucose, Bld: 109 mg/dL — ABNORMAL HIGH (ref 70–99)
Potassium: 3.3 mmol/L — ABNORMAL LOW (ref 3.5–5.1)
Sodium: 137 mmol/L (ref 135–145)
Total Bilirubin: 0.5 mg/dL (ref 0.3–1.2)
Total Protein: 7.5 g/dL (ref 6.5–8.1)

## 2022-04-19 NOTE — Progress Notes (Signed)
? ?Office Visit Note ? 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 ?Subjective:  ?New Patient (Initial Visit) (Insurance change) ? ? ?History of Present Illness: Robin [name] is a 51 y.o. female here for seropositive RA, previously a patient with Dr. Kathlene November. She was taking Humira previously interrupted due to loss of coverage and leflunomide with questionable response in symptoms. She was diagnosed after visit in 11/2019 with multiple areas of joint pain and frequent swelling particularly in bilateral wrists.  Laboratory work-up at that time including rheumatoid factor, CCP, ACE, HLA-B27, sed rate, and CRP were all negative.  She was started on methotrexate and the leflunomide with lack of response in her symptoms.  She started on Humira for this and took it for a few months but was then off the medication for at least about a year due to change in insurance status after losing her job.  She restarted this since 2 months ago so far has not seen a very impressive change in her symptoms.  She is significantly limited by severe lower back pain with degenerative arthritis pursuing disability for this.  Also has advanced osteoarthritis of bilateral knees.  Shoulder pain limits overhead movement or worsened with pressure.  Gets chronic hand pain worse around the thumbs but especially on the radial side of her wrist with fairly chronic swelling currently worse than normal on the right side.  She has had local steroid joint injections in multiple areas but none in the past year these were typically beneficial for at least weeks in the past.  For her osteoarthritis pain she takes diclofenac twice daily with a pretty good benefit has not experienced any side effects or problems taking this  medicine. ? ?Activities of Daily Living:  ?Patient reports morning stiffness for 1 hour.   ?Patient Reports nocturnal pain.  ?Difficulty dressing/grooming: Reports ?Difficulty climbing stairs: Reports ?Difficulty getting out of chair: Reports ?Difficulty using hands for taps, buttons, cutlery, and/or writing: Reports ? ?Review of Systems  ?Constitutional:  Positive for fatigue.  ?HENT:  Positive for mouth dryness.   ?Eyes:  Positive for dryness.  ?Respiratory:  Positive for shortness of breath.   ?Cardiovascular:  Positive for swelling in legs/feet.  ?Gastrointestinal:  Positive for constipation.  ?Endocrine: Positive for cold intolerance and heat intolerance.  ?Genitourinary:  Negative for difficulty urinating.  ?Musculoskeletal:  Positive for joint pain, gait problem, joint pain, joint swelling, muscle weakness, morning stiffness and muscle tenderness.  ?Skin:  Negative for rash.  ?Allergic/Immunologic: Positive for susceptible to infections.  ?Neurological:  Positive for numbness and weakness.  ?Hematological:  Negative for bruising/bleeding tendency.  ?Psychiatric/Behavioral:  Positive for sleep disturbance.   ? ?PMFS History:  ?Patient Active Problem List  ? Diagnosis Date Noted  ? High risk medication use 04/19/2022  ? Financial insecurity 02/13/2022  ? Anxiety and depression 01/30/2022  ? Hypercholesterolemia 01/30/2022  ? Primary hypertension 01/30/2022  ? Rheumatoid arthritis of multiple sites with negative rheumatoid factor ([place]) 01/30/2022  ? Osteoarthritis of left knee 06/01/2021  ? S/P lumbar fusion 05/05/2021  ? Degenerative scoliosis 04/18/2021  ? Low back pain 04/12/2021  ? Degeneration of lumbar intervertebral disc 05/14/2020  ? Sarcoidosis 05/10/2020  ?  Lumbar spondylosis 03/24/2020  ? Pain of left hip joint 03/24/2020  ? Cervical spondylosis without myelopathy 07/16/2019  ? Neck pain 07/16/2019  ? Obesity 10/18/2018  ? Pain in right knee 10/18/2018  ? Primary osteoarthritis of right knee  10/18/2018  ? Hammertoe of right foot 07/02/2018  ? OSA (obstructive sleep apnea) 10/26/2014  ?  ?Past Medical History:  ?Diagnosis Date  ? Allergy   ? Anxiety   ? Asthma   ? Depression   ? GERD (gastroesophageal reflux disease)   ? Hyperlipidemia   ? Hypertension   ? OSA (obstructive sleep apnea)   ? Pneumonia   ? Rheumatoid arthritis ([place])   ? Sarcoidosis   ?  ?History reviewed. No pertinent family history. ?Past Surgical History:  ?Procedure Laterality Date  ? ABDOMINAL HYSTERECTOMY    ? DEBRIDEMENT AND CLOSURE WOUND N/A 06/15/2021  ? Procedure: Irrigation and debridement and closure of spine wound;  Surgeon: Melina Schools, MD;  Location: [facility];  Service: Orthopedics;  Laterality: N/A;  ? ESOPHAGOGASTRODUODENOSCOPY    ? FOOT SURGERY Bilateral   ? Both surgeries for bunions by Dr. Janus Molder per pt  ? TRANSFORAMINAL LUMBAR INTERBODY FUSION (TLIF) WITH PEDICLE SCREW FIXATION 1 LEVEL N/A 05/05/2021  ? Procedure: TRANSFORAMINAL LUMBAR INTERBODY FUSION (TLIF) WITH PEDICLE SCREW FIXATION 1 LEVEL L2-3;  Surgeon: Melina Schools, MD;  Location: [facility];  Service: Orthopedics;  Laterality: N/A;  4 hrs  ? ?Social History  ? ?Social History Narrative  ? Not on file  ? ?Immunization History  ?Administered Date(s) Administered  ? Influenza,inj,quad, With Preservative 09/25/2019  ? Influenza-Unspecified 10/20/2017  ? Branch COVID-19 SARS-COV-2 PEDS BIVALENT BOOSTER 6Y-11Y 01/07/2020, 02/04/2020  ?  ? ?Objective: ?Vital Signs: BP 124/88 (BP Location: Right Arm, Patient Position: Sitting, Cuff Size: Large)   Pulse (!) 101   Resp 16   Ht $R'5\' 9"'fE$  (1.753 m)   Wt (!) 315 lb (142.9 kg)   BMI 46.52 kg/m?   ? ?Physical Exam ?Constitutional:   ?   Appearance: She is obese.  ?HENT:  ?   Mouth/Throat:  ?   Mouth: Mucous membranes are moist.  ?   Pharynx: Oropharynx is clear.  ?Eyes:  ?   Conjunctiva/sclera: Conjunctivae normal.  ?Cardiovascular:  ?   Rate and Rhythm: Normal rate and regular rhythm.  ?Pulmonary:  ?   Effort: Pulmonary  effort is normal.  ?   Breath sounds: Normal breath sounds.  ?Musculoskeletal:  ?   Right lower leg: No edema.  ?   Left lower leg: No edema.  ?Skin: ?   General: Skin is warm and dry.  ?   Findings: No rash.  ?Neurological:  ?   Mental Status: She is alert.  ?Psychiatric:     ?   Mood and Affect: Mood normal.  ?  ? ?Musculoskeletal Exam:  ?Neck muscle tenderness but range of motion is intact ?Bilateral shoulder tenderness to palpation unable to actively abduct overhead due to pain, external rotation for flexion and extension appear normal, no palpable swelling ?No elbow swelling or focal tenderness ?Bilateral swelling present on the flexor and radial side of both wrists, palpable cyst on right wrist ?Tenderness to pressure over right thumb MCP joint otherwise some diffuse sensitivity in her hands but no focal joint swelling and range of motion is intact ?Bilateral knee crepitus slightly decreased flexion and extension range of motion with no palpable effusions ? ?CDAI Exam: ?CDAI Score: 17  ?Patient Global: 50 mm; Provider Global: 30 mm ?  Swollen: 2 ; Tender: 7  ?Joint Exam 04/19/2022  ? ?   Right  Left  ?Glenohumeral   Tender   Tender  ?Wrist  Swollen Tender  Swollen Tender  ?MCP 1   Tender     ?Knee   Tender   Tender  ? ? ? ?Investigation: ?No additional findings. ? ?Imaging: ?No results found. ? ?Recent Labs: ?Lab Results  ?Component Value Date  ? WBC 15.6 (H) 04/19/2022  ? HGB 14.9 04/19/2022  ? PLT 260 04/19/2022  ? NA 137 04/19/2022  ? K 3.3 (L) 04/19/2022  ? CL 105 04/19/2022  ? CO2 24 04/19/2022  ? GLUCOSE 109 (H) 04/19/2022  ? BUN 10 04/19/2022  ? CREATININE 0.87 04/19/2022  ? BILITOT 0.5 04/19/2022  ? ALKPHOS 96 04/19/2022  ? AST 26 04/19/2022  ? ALT 28 04/19/2022  ? PROT 7.5 04/19/2022  ? ALBUMIN 3.5 04/19/2022  ? CALCIUM 9.1 04/19/2022  ? GFRAA  10/12/2009  ?  >60        ?The eGFR has been calculated ?using the MDRD equation. ?This calculation has not been ?validated in all clinical ?situations. ?eGFR's  persistently ?<60 mL/min signify ?possible Chronic Kidney Disease.  ? ? ?Speciality Comments: No specialty comments available. ? ?Procedures:  ?No procedures performed ?Allergies: Lisinopril  ? ?Assessment / Plan:     ?Visit Diagnoses: Rheum

## 2022-04-23 LAB — QUANTIFERON-TB GOLD PLUS: QuantiFERON-TB Gold Plus: NEGATIVE

## 2022-04-23 LAB — QUANTIFERON-TB GOLD PLUS (RQFGPL)
QuantiFERON Mitogen Value: >10 IU/mL
QuantiFERON Nil Value: 0.21 IU/mL
QuantiFERON TB1 Ag Value: 0.29 IU/mL
QuantiFERON TB2 Ag Value: 0.28 IU/mL

## 2022-04-24 LAB — MISC LABCORP TEST (SEND OUT)
LabCorp test name: 14
Labcorp test code: 504550

## 2022-07-10 ENCOUNTER — Telehealth: Payer: Self-pay

## 2022-07-10 NOTE — Telephone Encounter (Signed)
Patient called asking for an appointment. Patient has been experiencing back and leg pain for months. Pain has gotten progressively worse. Described that pain is felt in her bones. Currently taking diclofenac and gabapentin, but it does not help with this pain.  Patient is also experiencing blurred vision. Has bumps on both of her eyes for years. Since last week bumps have been sore and red which have cause tears. Has not taken medication for this issue.

## 2022-07-24 ENCOUNTER — Other Ambulatory Visit: Payer: Self-pay

## 2022-07-24 MED ORDER — HUMIRA 40 MG/0.8ML ~~LOC~~ PSKT: PREFILLED_SYRINGE | SUBCUTANEOUS | 6 refills | Status: DC

## 2022-08-11 ENCOUNTER — Other Ambulatory Visit: Payer: Self-pay | Admitting: Internal Medicine

## 2022-09-15 NOTE — Telephone Encounter (Signed)
Patient has started with new pcp

## 2022-09-18 ENCOUNTER — Ambulatory Visit (INDEPENDENT_AMBULATORY_CARE_PROVIDER_SITE_OTHER): Payer: Self-pay | Admitting: Sports Medicine

## 2022-09-18 DIAGNOSIS — M170 Bilateral primary osteoarthritis of knee: Secondary | ICD-10-CM

## 2022-09-18 DIAGNOSIS — Z981 Arthrodesis status: Secondary | ICD-10-CM

## 2022-09-18 DIAGNOSIS — Z6841 Body Mass Index (BMI) 40.0 and over, adult: Secondary | ICD-10-CM

## 2022-09-18 MED ORDER — PREDNISONE 50 MG PO TABS: ORAL_TABLET | ORAL | 0 refills | Status: DC

## 2022-09-18 NOTE — Assessment & Plan Note (Addendum)
Has had some injections in the past, we will do the prednisone today for her back, but in the future we may need to proceed with steroid injections into her knees and potentially viscosupplementation if she can get better insured.

## 2022-09-18 NOTE — Progress Notes (Addendum)
    Procedures performed today:    None.  Independent interpretation of notes and tests performed by another provider:   New MRI personally reviewed, there is severe spinal stenosis at L3-L4 and L4-L5, there is also lesser so stenosis at L5-S1, these are all below the fusion level.    Brief History, Exam, Impression, and Recommendations:    Lumbar spinal stenosis status post L2-L3 TLIF Pleasant 51 year old female with morbid obesity and rheumatoid arthritis on Humira , she has a long history of axial low back pain, ultimately was found to have what sounds to be cauda equina syndrome with urinary incontinence and progressive weakness, she had an urgent L2-L3 TLIF with Dr. Burnetta. Continued to have pain. Unfortunately is starting to have progressive weakness in both legs without any additional urinary incontinence. Severe axial back pain better with flexion. Exam is for the most part normal with good strength, she does have some persistent paresthesias and numbness left lateral thigh. Due to progressive weakness I do think we need to proceed with an updated MRI, we will get this today. Adding 5 days of prednisone , additional home conditioning though she is doing some pro bono physical therapy at [facility]. I would like to see her back in about 6 weeks or sooner based on the MRI results.  Update: MRI personally reviewed, there is severe spinal stenosis at L3-L4 and L4-L5, there is also lesser so stenosis at L5-S1, these are all below the fusion level.  If she is not noticing really good improvement after the 5 days of prednisone  I think we should also proceed with an epidural while she is doing the physical therapy.  Needs to keep close follow-up in 4 to 6 weeks for consideration of extending the fusion unfortunately.  Obesity I have suggested considering bariatric surgery, we will keep this on the back burner for now.  Primary osteoarthritis of both knees Has had some injections  in the past, we will do the prednisone  today for her back, but in the future we may need to proceed with steroid injections into her knees and potentially viscosupplementation if she can get better insured.    ____________________________________________ Debby PARAS. Curtis, M.D., ABFM., CAQSM., AME. Primary Care and Sports Medicine [facility]  Adjunct Professor of [facility] Medicine  [employer]  Restaurant Manager, [employer]

## 2022-09-18 NOTE — Assessment & Plan Note (Signed)
I have suggested considering bariatric surgery, we will keep this on the back burner for now.

## 2022-09-18 NOTE — Assessment & Plan Note (Signed)
Pleasant 51 year old female with morbid obesity and rheumatoid arthritis on Humira, she has a long history of axial low back pain, ultimately was found to have what sounds to be cauda equina syndrome with urinary incontinence and progressive weakness, she had an urgent L2-L3 TLIF with Dr. Rolena Infante. Continued to have pain. Unfortunately is starting to have progressive weakness in both legs without any additional urinary incontinence. Severe axial back pain better with flexion. Exam is for the most part normal with good strength, she does have some persistent paresthesias and numbness left lateral thigh. Due to progressive weakness I do think we need to proceed with an updated MRI, we will get this today. Adding 5 days of prednisone, additional home conditioning though she is doing some pro bono physical therapy at [facility]. I would like to see her back in about 6 weeks or sooner based on the MRI results.  Update: MRI personally reviewed, there is severe spinal stenosis at L3-L4 and L4-L5, there is also lesser so stenosis at L5-S1, these are all below the fusion level.  If she is not noticing really good improvement after the 5 days of prednisone I think we should also proceed with an epidural while she is doing the physical therapy.  Needs to keep close follow-up in 4 to 6 weeks for consideration of extending the fusion unfortunately.

## 2022-09-19 ENCOUNTER — Ambulatory Visit (INDEPENDENT_AMBULATORY_CARE_PROVIDER_SITE_OTHER): Payer: Self-pay

## 2022-09-19 DIAGNOSIS — Z981 Arthrodesis status: Secondary | ICD-10-CM

## 2022-09-19 DIAGNOSIS — M545 Low back pain, unspecified: Secondary | ICD-10-CM

## 2022-10-30 ENCOUNTER — Ambulatory Visit (INDEPENDENT_AMBULATORY_CARE_PROVIDER_SITE_OTHER): Payer: Self-pay | Admitting: Sports Medicine

## 2022-10-30 ENCOUNTER — Ambulatory Visit (INDEPENDENT_AMBULATORY_CARE_PROVIDER_SITE_OTHER): Payer: Self-pay

## 2022-10-30 VITALS — Wt 315.0 lb

## 2022-10-30 DIAGNOSIS — Z981 Arthrodesis status: Secondary | ICD-10-CM

## 2022-10-30 DIAGNOSIS — M170 Bilateral primary osteoarthritis of knee: Secondary | ICD-10-CM

## 2022-10-30 MED ORDER — TRIAMCINOLONE ACETONIDE 40 MG/ML IJ SUSP
80.0000 mg | Freq: Once | INTRAMUSCULAR | Status: AC
  Administered 2022-10-30: 80 mg via INTRAMUSCULAR

## 2022-10-30 MED ORDER — GABAPENTIN 300 MG PO CAPS: ORAL_CAPSULE | ORAL | 3 refills | Status: DC

## 2022-10-30 NOTE — Addendum Note (Signed)
Addended by: Tarri Glenn A on: 10/30/2022 11:50 AM   Modules accepted: Orders

## 2022-10-30 NOTE — Assessment & Plan Note (Signed)
Known lumbar spinal stenosis, she is post L2-L3 TLIF with Dr. Rolena Infante, unfortunately has residual spinal stenosis L3-L4 and L4-L5. She did not take the prednisone. Increasing gabapentin to 300 mg 3 times daily, she is a candidate for an epidural so we will proceed with a left L3-L4 interlaminar epidural. Return to see me in about 6 weeks.

## 2022-10-30 NOTE — Progress Notes (Signed)
    Procedures performed today:    Procedure: Real-time Ultrasound Guided injection of the left knee Device: Samsung HS60  Verbal informed consent obtained.  Time-out conducted.  Noted no overlying erythema, induration, or other signs of local infection.  Skin prepped in a sterile fashion.  Local anesthesia: Topical Ethyl chloride.  With sterile technique and under real time ultrasound guidance: Mild effusion noted, 1 cc Kenalog 40, 2 cc lidocaine, 2 cc bupivacaine injected easily Completed without difficulty  Advised to call if fevers/chills, erythema, induration, drainage, or persistent bleeding.  Images permanently stored and available for review in PACS.  Impression: Technically successful ultrasound guided injection.  Procedure: Real-time Ultrasound Guided injection of the right knee Device: Samsung HS60  Verbal informed consent obtained.  Time-out conducted.  Noted no overlying erythema, induration, or other signs of local infection.  Skin prepped in a sterile fashion.  Local anesthesia: Topical Ethyl chloride.  With sterile technique and under real time ultrasound guidance: Mild effusion noted, 1 cc Kenalog 40, 2 cc lidocaine, 2 cc bupivacaine injected easily Completed without difficulty  Advised to call if fevers/chills, erythema, induration, drainage, or persistent bleeding.  Images permanently stored and available for review in PACS.  Impression: Technically successful ultrasound guided injection.  Independent interpretation of notes and tests performed by another provider:   None.  Brief History, Exam, Impression, and Recommendations:    Primary osteoarthritis of both knees Known bilateral knee osteoarthritis, not much better with prednisone, today we proceeded with bilateral steroid injections, return to see me in 6 weeks.  Lumbar spinal stenosis status post L2-L3 TLIF Known lumbar spinal stenosis, she is post L2-L3 TLIF with Dr. Rolena Infante, unfortunately has residual  spinal stenosis L3-L4 and L4-L5. She did not take the prednisone. Increasing gabapentin to 300 mg 3 times daily, she is a candidate for an epidural so we will proceed with a left L3-L4 interlaminar epidural. Return to see me in about 6 weeks.   ____________________________________________ Robin [name]. Dianah Field, M.D., ABFM., CAQSM., AME. Primary Care and Sports Medicine [facility] [facility]  Adjunct Professor of [employer]  Risk manager

## 2022-10-30 NOTE — Assessment & Plan Note (Signed)
Known bilateral knee osteoarthritis, not much better with prednisone, today we proceeded with bilateral steroid injections, return to see me in 6 weeks.

## 2022-11-27 DIAGNOSIS — E559 Vitamin D deficiency, unspecified: Secondary | ICD-10-CM | POA: Insufficient documentation

## 2022-11-29 ENCOUNTER — Other Ambulatory Visit: Payer: Self-pay | Admitting: Physician Assistant

## 2022-11-29 DIAGNOSIS — Z1231 Encounter for screening mammogram for malignant neoplasm of breast: Secondary | ICD-10-CM

## 2022-12-04 NOTE — Progress Notes (Unsigned)
Office Visit Note  Patient: Robin [name]             Date of Birth: 06/03/1971           MRN: [id number]             PCP: Mack Hook, MD Referring: Mack Hook, MD Visit Date: 12/05/2022   Subjective:  No chief complaint on file.   History of Present Illness: Robin [name] is a 51 y.o. female here for follow up for seronegative RA on Humira 40 mg [place] q14days. She had updated labs checked in family medicine clinic November with negative RA serology and normal inflammatory markers. So far has seen limited benefit with RA medications still considerable joint pain. ***   Previous HPI 04/18/22 Robin [name] is a 51 y.o. female here for seropositive RA, previously a patient with Dr. Kathlene November. She was taking Humira previously interrupted due to loss of coverage and leflunomide with questionable response in symptoms. She was diagnosed after visit in 11/2019 with multiple areas of joint pain and frequent swelling particularly in bilateral wrists.  Laboratory work-up at that time including rheumatoid factor, CCP, ACE, HLA-B27, sed rate, and CRP were all negative.  She was started on methotrexate and the leflunomide with lack of response in her symptoms.  She started on Humira for this and took it for a few months but was then off the medication for at least about a year due to change in insurance status after losing her job.  She restarted this since 2 months ago so far has not seen a very impressive change in her symptoms.  She is significantly limited by severe lower back pain with degenerative arthritis pursuing disability for this.  Also has advanced osteoarthritis of bilateral knees.  Shoulder pain limits overhead movement or worsened with pressure.  Gets chronic hand pain worse around the thumbs but especially on the radial side of her wrist with fairly chronic swelling currently worse than normal on the right side.  She has had local steroid joint injections in  multiple areas but none in the past year these were typically beneficial for at least weeks in the past.  For her osteoarthritis pain she takes diclofenac twice daily with a pretty good benefit has not experienced any side effects or problems taking this medicine.   DMARD Hx MTX and LEF - inadequate response  No Rheumatology ROS completed.   PMFS History:  Patient Active Problem List   Diagnosis Date Noted   High risk medication use 04/19/2022   Financial insecurity 02/13/2022   Anxiety and depression 01/30/2022   Hypercholesterolemia 01/30/2022   Primary hypertension 01/30/2022   Rheumatoid arthritis of multiple sites with negative rheumatoid factor ([place]) 01/30/2022   Osteoarthritis of left knee 06/01/2021   Lumbar spinal stenosis status post L2-L3 TLIF 05/05/2021   Degenerative scoliosis 04/18/2021   Sarcoidosis 05/10/2020   Pain of left hip joint 03/24/2020   Cervical spondylosis without myelopathy 07/16/2019   Neck pain 07/16/2019   Obesity 10/18/2018   Primary osteoarthritis of both knees 10/18/2018   Hammertoe of right foot 07/02/2018   OSA (obstructive sleep apnea) 10/26/2014    Past Medical History:  Diagnosis Date   Allergy    Anxiety    Asthma    Depression    GERD (gastroesophageal reflux disease)    Hyperlipidemia    Hypertension    OSA (obstructive sleep apnea)    Pneumonia    Rheumatoid arthritis ([place])  Sarcoidosis     No family history on file. Past Surgical History:  Procedure Laterality Date   ABDOMINAL HYSTERECTOMY     DEBRIDEMENT AND CLOSURE WOUND N/A 06/15/2021   Procedure: Irrigation and debridement and closure of spine wound;  Surgeon: Melina Schools, MD;  Location: [place];  Service: Orthopedics;  Laterality: N/A;   ESOPHAGOGASTRODUODENOSCOPY     FOOT SURGERY Bilateral    Both surgeries for bunions by Dr. Janus Molder per pt   TRANSFORAMINAL LUMBAR INTERBODY FUSION (TLIF) WITH PEDICLE SCREW FIXATION 1 LEVEL N/A 05/05/2021   Procedure:  TRANSFORAMINAL LUMBAR INTERBODY FUSION (TLIF) WITH PEDICLE SCREW FIXATION 1 LEVEL L2-3;  Surgeon: Melina Schools, MD;  Location: [place];  Service: Orthopedics;  Laterality: N/A;  4 hrs   Social History   Social History Narrative   Not on file   Immunization History  Administered Date(s) Administered   Influenza,inj,quad, With Preservative 09/25/2019   Influenza-Unspecified 10/20/2017   MODERNA COVID-19 SARS-COV-2 PEDS BIVALENT BOOSTER 6Y-11Y 01/07/2020, 02/04/2020   Unspecified SARS-COV-2 Vaccination 01/07/2020, 02/04/2020     Objective: Vital Signs: There were no vitals taken for this visit.   Physical Exam   Musculoskeletal Exam: ***  CDAI Exam: CDAI Score: -- Patient Global: --; Provider Global: -- Swollen: --; Tender: -- Joint Exam 12/05/2022   No joint exam has been documented for this visit   There is currently no information documented on the homunculus. Go to the Rheumatology activity and complete the homunculus joint exam.  Investigation: No additional findings.  Imaging: No results found.  Recent Labs: Lab Results  Component Value Date   WBC 15.6 (H) 04/19/2022   HGB 14.9 04/19/2022   PLT 260 04/19/2022   NA 137 04/19/2022   K 3.3 (L) 04/19/2022   CL 105 04/19/2022   CO2 24 04/19/2022   GLUCOSE 109 (H) 04/19/2022   BUN 10 04/19/2022   CREATININE 0.87 04/19/2022   BILITOT 0.5 04/19/2022   ALKPHOS 96 04/19/2022   AST 26 04/19/2022   ALT 28 04/19/2022   PROT 7.5 04/19/2022   ALBUMIN 3.5 04/19/2022   CALCIUM 9.1 04/19/2022   GFRAA  10/12/2009    >60        The eGFR has been calculated using the MDRD equation. This calculation has not been validated in all clinical situations. eGFR's persistently <60 mL/min signify possible Chronic Kidney Disease.   QFTBGOLDPLUS Negative 04/19/2022    Speciality Comments: No specialty comments available.  Procedures:  No procedures performed Allergies: Lisinopril   Assessment / Plan:     Visit  Diagnoses: No diagnosis found.  ***  Orders: No orders of the defined types were placed in this encounter.  No orders of the defined types were placed in this encounter.    Follow-Up Instructions: No follow-ups on file.   Collier Salina, MD  Note - This record has been created using [employer].  Chart creation errors have been sought, but may not always  have been located. Such creation errors do not reflect on  the standard of medical care.

## 2022-12-05 ENCOUNTER — Ambulatory Visit: Payer: Medicaid Other | Attending: Internal Medicine | Admitting: Internal Medicine

## 2022-12-05 ENCOUNTER — Ambulatory Visit: Payer: Medicaid Other

## 2022-12-05 ENCOUNTER — Encounter: Payer: Self-pay | Admitting: Internal Medicine

## 2022-12-05 ENCOUNTER — Ambulatory Visit (INDEPENDENT_AMBULATORY_CARE_PROVIDER_SITE_OTHER): Payer: Medicaid Other

## 2022-12-05 VITALS — BP 119/78 | HR 85 | Resp 18 | Ht 69.0 in | Wt 329.0 lb

## 2022-12-05 DIAGNOSIS — Z79899 Other long term (current) drug therapy: Secondary | ICD-10-CM | POA: Diagnosis not present

## 2022-12-05 DIAGNOSIS — M0609 Rheumatoid arthritis without rheumatoid factor, multiple sites: Secondary | ICD-10-CM

## 2022-12-05 DIAGNOSIS — D869 Sarcoidosis, unspecified: Secondary | ICD-10-CM

## 2022-12-05 DIAGNOSIS — R768 Other specified abnormal immunological findings in serum: Secondary | ICD-10-CM | POA: Diagnosis not present

## 2022-12-11 ENCOUNTER — Telehealth: Payer: Self-pay | Admitting: Sports Medicine

## 2022-12-11 ENCOUNTER — Ambulatory Visit (INDEPENDENT_AMBULATORY_CARE_PROVIDER_SITE_OTHER): Payer: Medicaid Other | Admitting: Sports Medicine

## 2022-12-11 DIAGNOSIS — Z981 Arthrodesis status: Secondary | ICD-10-CM | POA: Diagnosis not present

## 2022-12-11 DIAGNOSIS — M170 Bilateral primary osteoarthritis of knee: Secondary | ICD-10-CM | POA: Diagnosis not present

## 2022-12-11 MED ORDER — TRAMADOL HCL 50 MG PO TABS: 50.0000 mg | ORAL_TABLET | Freq: Three times a day (TID) | ORAL | 0 refills | Status: DC | PRN

## 2022-12-11 NOTE — Assessment & Plan Note (Signed)
Known bilateral knee osteoarthritis, we injected both knees at the last visit, she had only temporary relief with return of pain. Adding tramadol for pain relief and we will start Orthovisc approval process, I would also like a surgical consultation. Return to see me for Orthovisc if/when approved.

## 2022-12-11 NOTE — Progress Notes (Signed)
    Procedures performed today:    None.  Independent interpretation of notes and tests performed by another provider:   None.  Brief History, Exam, Impression, and Recommendations:    Primary osteoarthritis of both knees Known bilateral knee osteoarthritis, we injected both knees at the last visit, she had only temporary relief with return of pain. Adding tramadol for pain relief and we will start Orthovisc approval process, I would also like a surgical consultation. Return to see me for Orthovisc if/when approved.    ____________________________________________ Ihor Austin. Benjamin Stain, M.D., ABFM., CAQSM., AME. Primary Care and Sports Medicine [place] [facility]  Adjunct Professor of Family Medicine  [employer]  Restaurant manager, fast food

## 2022-12-11 NOTE — Telephone Encounter (Signed)
Viscosupplementation approval bilateral, x-ray confirmed osteoarthritis, failed NSAIDs, steroids, greater than 6 weeks of home physical therapy, injections.

## 2022-12-12 LAB — SEDIMENTATION RATE: Sed Rate: 11 mm/h (ref 0–30)

## 2022-12-12 LAB — ADALIMUMAB DRUG LEVEL AND ANTI-DRUG AB FOR RHEUMATIC DISEASE
ADALIMUMAB AB,RHEUM: <10 AU (ref <10)
ADALIMUMAB LEVEL, RHEUM: 7.5 ug/mL

## 2022-12-12 LAB — ANTI-DNA ANTIBODY, DOUBLE-STRANDED: ds DNA Ab: <1 IU/mL

## 2022-12-12 LAB — C3 AND C4
C3 Complement: 179 mg/dL (ref 83–193)
C4 Complement: 45 mg/dL (ref 15–57)

## 2022-12-12 LAB — ANTI-SMITH ANTIBODY: ENA SM Ab Ser-aCnc: <1 AI

## 2022-12-12 LAB — SJOGRENS SYNDROME-A EXTRACTABLE NUCLEAR ANTIBODY: SSA (Ro) (ENA) Antibody, IgG: <1 AI

## 2022-12-12 LAB — RNP ANTIBODY: Ribonucleic Protein(ENA) Antibody, IgG: <1 AI

## 2022-12-13 NOTE — Telephone Encounter (Signed)
PA information submitted via [email or web address] for Orthovisc Paperwork has been printed and given to Dr. T for signatures. Once obtained, information will be faxed to [employer] at [phone]  

## 2022-12-20 NOTE — Telephone Encounter (Signed)
Benefits Investigation Details received from [employer] Injection: Orthovisc  Medical: deductible and oop do not apply PA required: No/ I called [employer] and they verified no PA was needed.  Pharmacy: product not covered under phar,acy plan  Specialty Pharmacy: [facility]  May fill through: [facility] OR Specialty Pharmacy [facility] Copay/Coinsurance: no Product Copay: 0% Administration Coinsurance: 0 Administration Copay: $4.

## 2022-12-22 ENCOUNTER — Telehealth: Payer: Self-pay

## 2022-12-22 NOTE — Telephone Encounter (Signed)
Initiated Prior authorization [id number] 50MG  tablets Via: Covermymeds Case/Key:  Status: Pending as of 12/22/22 Reason: Notified Pt via: Mychart

## 2022-12-26 NOTE — Telephone Encounter (Signed)
Patient notified and states she is out of town and will schedule when she gets back

## 2022-12-28 NOTE — Discharge Instructions (Signed)

## 2022-12-29 ENCOUNTER — Ambulatory Visit: Payer: Medicaid Other | Admitting: Orthopaedic Surgery

## 2022-12-29 ENCOUNTER — Inpatient Hospital Stay
Admission: RE | Admit: 2022-12-29 | Discharge: 2022-12-29 | Disposition: A | Discharge status: Home / Self Care | Payer: Medicaid Other | Source: Ambulatory Visit | Attending: Sports Medicine | Admitting: Sports Medicine

## 2023-01-02 ENCOUNTER — Ambulatory Visit (INDEPENDENT_AMBULATORY_CARE_PROVIDER_SITE_OTHER): Payer: Medicaid Other | Admitting: Sports Medicine

## 2023-01-02 ENCOUNTER — Ambulatory Visit (INDEPENDENT_AMBULATORY_CARE_PROVIDER_SITE_OTHER): Payer: Medicaid Other

## 2023-01-02 DIAGNOSIS — M170 Bilateral primary osteoarthritis of knee: Secondary | ICD-10-CM

## 2023-01-02 MED ORDER — HYALURONAN 30 MG/2ML IX SOSY
30.0000 mg | PREFILLED_SYRINGE | Freq: Once | INTRA_ARTICULAR | Status: AC
  Administered 2023-01-02: 30 mg via INTRA_ARTICULAR

## 2023-01-02 NOTE — Assessment & Plan Note (Signed)
Orthovisc 1 of 4 both knees, return in 1 week for #2 of 4 

## 2023-01-02 NOTE — Progress Notes (Signed)
    Procedures performed today:    Procedure: Real-time Ultrasound Guided injection of the left knee Device: Samsung HS60  Verbal informed consent obtained.  Time-out conducted.  Noted no overlying erythema, induration, or other signs of local infection.  Skin prepped in a sterile fashion.  Local anesthesia: Topical Ethyl chloride.  With sterile technique and under real time ultrasound guidance: Mild effusion noted, Orthovisc Completed without difficulty  Advised to call if fevers/chills, erythema, induration, drainage, or persistent bleeding.  Images permanently stored and available for review in PACS.  Impression: Technically successful ultrasound guided injection.  Procedure: Real-time Ultrasound Guided injection of the right knee Device: Samsung HS60  Verbal informed consent obtained.  Time-out conducted.  Noted no overlying erythema, induration, or other signs of local infection.  Skin prepped in a sterile fashion.  Local anesthesia: Topical Ethyl chloride.  With sterile technique and under real time ultrasound guidance: Mild effusion noted, Orthovisc Completed without difficulty  Advised to call if fevers/chills, erythema, induration, drainage, or persistent bleeding.  Images permanently stored and available for review in PACS.  Impression: Technically successful ultrasound guided injection.  Independent interpretation of notes and tests performed by another provider:   None.  Brief History, Exam, Impression, and Recommendations:    Primary osteoarthritis of both knees Orthovisc 1 of 4 both knees, return in 1 week for #2 of 4.    ____________________________________________ Gwen Her. Dianah Field, M.D., ABFM., CAQSM., AME. Primary Care and Sports Medicine [facility]  Adjunct Professor of [employer] [facility] [employer] [facility] [employer]  Risk manager

## 2023-01-03 ENCOUNTER — Ambulatory Visit
Admission: RE | Admit: 2023-01-03 | Discharge: 2023-01-03 | Disposition: A | Discharge status: Home / Self Care | Payer: Medicaid Other | Source: Ambulatory Visit | Attending: Sports Medicine | Admitting: Sports Medicine

## 2023-01-03 DIAGNOSIS — Z981 Arthrodesis status: Secondary | ICD-10-CM

## 2023-01-03 MED ORDER — METHYLPREDNISOLONE ACETATE 40 MG/ML INJ SUSP (RADIOLOG
80.0000 mg | Freq: Once | INTRAMUSCULAR | Status: AC
  Administered 2023-01-03: 80 mg via EPIDURAL

## 2023-01-03 MED ORDER — IOPAMIDOL (ISOVUE-M 200) INJECTION 41%
1.0000 mL | Freq: Once | INTRAMUSCULAR | Status: AC
  Administered 2023-01-03: 1 mL via EPIDURAL

## 2023-01-03 NOTE — Discharge Instructions (Signed)

## 2023-01-09 ENCOUNTER — Other Ambulatory Visit: Payer: Self-pay | Admitting: Sports Medicine

## 2023-01-09 ENCOUNTER — Ambulatory Visit: Payer: Medicaid Other | Admitting: Sports Medicine

## 2023-01-09 ENCOUNTER — Ambulatory Visit (INDEPENDENT_AMBULATORY_CARE_PROVIDER_SITE_OTHER): Payer: Medicaid Other

## 2023-01-09 ENCOUNTER — Ambulatory Visit: Payer: Self-pay

## 2023-01-09 ENCOUNTER — Ambulatory Visit (INDEPENDENT_AMBULATORY_CARE_PROVIDER_SITE_OTHER): Payer: Medicaid Other | Admitting: Orthopaedic Surgery

## 2023-01-09 ENCOUNTER — Other Ambulatory Visit: Payer: Self-pay

## 2023-01-09 VITALS — BP 149/88 | HR 85

## 2023-01-09 DIAGNOSIS — M170 Bilateral primary osteoarthritis of knee: Secondary | ICD-10-CM

## 2023-01-09 DIAGNOSIS — M25561 Pain in right knee: Secondary | ICD-10-CM

## 2023-01-09 DIAGNOSIS — M25562 Pain in left knee: Secondary | ICD-10-CM

## 2023-01-09 DIAGNOSIS — Z981 Arthrodesis status: Secondary | ICD-10-CM

## 2023-01-09 MED ORDER — TRAMADOL HCL 50 MG PO TABS: 50.0000 mg | ORAL_TABLET | Freq: Three times a day (TID) | ORAL | 0 refills | Status: DC | PRN

## 2023-01-09 NOTE — Progress Notes (Signed)
Office Visit Note   Patient: Robin [name]           Date of Birth: 1971/05/19           MRN: [id number] Visit Date: 01/09/2023              Requested by: Silverio Dec[address], [address],  [place] [address] PCP: Robin Ny, PA-C   Assessment & Plan: Visit Diagnoses:  1. Pain in both knees, unspecified chronicity   2. Lumbar spinal stenosis status post L2-L3 TLIF   3. Primary osteoarthritis of both knees     Plan: Patient needs referral to the [facility] weight loss clinic to get her BMI down so that she can then proceed with total knee arthroplasty.  At this point she is having great difficulty walking.  She can follow-up with me in 6 months.  We will weigh patient on return.  Follow-Up Instructions: No follow-ups on file.   Orders:  Orders Placed This Encounter  Procedures   XR Knee 1-2 Views Right   XR Knee 1-2 Views Left   No orders of the defined types were placed in this encounter.     Procedures: No procedures performed   Clinical Data: No additional findings.   Subjective: Chief Complaint  Patient presents with   Right Knee - Pain   Left Knee - Pain    HPI 52 year old female new patient goes by Robin [name] is seen with bilateral knee pain known arthritis medially.  She states she has been seen in the past with cortisone injection without relief and is waiting for approval for Orthovisc and injections even though she has bone-on-bone changes.  Patient states she currently does not have health insurance for period of time and then just got covered in December.  Cortisone injections were not helpful when she was seen at [facility].  Previous gel injections with minimal relief.  No previous knee surgeries.  Current BMI is 40 at5'9" and 329 pounds.  Review of Systems previous lumbar L2-3 TLIF.  She has adjacent level multilevel narrowing at all the rest of the remaining lumbar levels.  Positive history of hypertension hyper cholesterol.   BMI 48.   Objective: Vital Signs: BP (!) 149/88   Pulse 85   Physical Exam Constitutional:      Appearance: She is well-developed.  HENT:     Head: Normocephalic.     Right Ear: External ear normal.     Left Ear: External ear normal. There is no impacted cerumen.  Eyes:     Pupils: Pupils are equal, round, and reactive to light.  Neck:     Thyroid: No thyromegaly.     Trachea: No tracheal deviation.  Cardiovascular:     Rate and Rhythm: Normal rate.  Pulmonary:     Effort: Pulmonary effort is normal.  Abdominal:     Palpations: Abdomen is soft.  Musculoskeletal:     Cervical back: No rigidity.  Skin:    General: Skin is warm and dry.  Neurological:     Mental Status: She is alert and oriented to person, place, and time.  Psychiatric:        Behavior: Behavior normal.     Ortho Exam anterior tib EHL is negative.  Mild knee swelling tenderness medial bilaterally right and left knee crepitus with knee range of motion.  Specialty Comments:  No specialty comments available.  Imaging: No results found.   PMFS History: Patient Active Problem List  Diagnosis Date Noted   Positive ANA (antinuclear antibody) 12/05/2022   Vitamin D deficiency 11/27/2022   High risk medication use 04/19/2022   Financial insecurity 02/13/2022   Anxiety and depression 01/30/2022   Hypercholesterolemia 01/30/2022   Primary hypertension 01/30/2022   Rheumatoid arthritis of multiple sites with negative rheumatoid factor ([place]) 01/30/2022   Osteoarthritis of left knee 06/01/2021   Lumbar spinal stenosis status post L2-L3 TLIF 05/05/2021   Degenerative scoliosis 04/18/2021   Sarcoidosis 05/10/2020   Pain of left hip joint 03/24/2020   Cervical spondylosis without myelopathy 07/16/2019   Neck pain 07/16/2019   Obesity 10/18/2018   Primary osteoarthritis of both knees 10/18/2018   Hammertoe of right foot 07/02/2018   OSA (obstructive sleep apnea) 10/26/2014   Past Medical History:   Diagnosis Date   Allergy    Anxiety    Asthma    Depression    GERD (gastroesophageal reflux disease)    Hyperlipidemia    Hypertension    OSA (obstructive sleep apnea)    Pneumonia    Rheumatoid arthritis ([place])    Sarcoidosis     No family history on file.  Past Surgical History:  Procedure Laterality Date   ABDOMINAL HYSTERECTOMY     DEBRIDEMENT AND CLOSURE WOUND N/A 06/15/2021   Procedure: Irrigation and debridement and closure of spine wound;  Surgeon: Melina Schools, MD;  Location: [facility];  Service: Orthopedics;  Laterality: N/A;   ESOPHAGOGASTRODUODENOSCOPY     FOOT SURGERY Bilateral    Both surgeries for bunions by Dr. Janus Molder per pt   TRANSFORAMINAL LUMBAR INTERBODY FUSION (TLIF) WITH PEDICLE SCREW FIXATION 1 LEVEL N/A 05/05/2021   Procedure: TRANSFORAMINAL LUMBAR INTERBODY FUSION (TLIF) WITH PEDICLE SCREW FIXATION 1 LEVEL L2-3;  Surgeon: Melina Schools, MD;  Location: [facility];  Service: Orthopedics;  Laterality: N/A;  4 hrs   Social History   Occupational History   Occupation: payroll  Tobacco Use   Smoking status: Former    Packs/day: 0.10    Years: 2.00    Total pack years: 0.20    Types: Cigarettes    Quit date: 12/25/1996    Years since quitting: 26.0    Passive exposure: Never   Smokeless tobacco: Never   Tobacco comments:    smoked 2-3 cigs daily x 2 years  Vaping Use   Vaping Use: Never used  Substance and Sexual Activity   Alcohol use: Yes    Alcohol/week: 0.0 standard drinks of alcohol    Comment: rarely   Drug use: No   Sexual activity: Not on file

## 2023-01-09 NOTE — Progress Notes (Signed)
Office Visit Note  Patient: Robin [name]             Date of Birth: 12-05-1971           MRN: [id number]             PCP: Jorge Ny, PA-C Referring: Jorge Ny, PA-C Visit Date: 01/10/2023   Subjective:  Follow-up (Doing good)   History of Present Illness: Robin [name] is a 52 y.o. female here for follow up for seronegative RA on Humira 40 mg [place] q14days. Since our last visit she has a lot of ongoing joint pain her worst affected joints are in both knees where she has known severe osteoarthritis.  She is planned for completing series of visco supplementation injections. This is ongoing as a temporizing measure she is not currently candidate for joint replacement surgery due to morbid obesity.  She is seeing weight loss clinic for this.  Besides that she still having some ongoing joint pain and swelling in her upper extremities and increase in facial rashes.  Previous HPI 12/05/22 Robin [name] is a 52 y.o. female here for follow up for seronegative RA on Humira 40 mg [place] q14days. She had updated labs checked in family medicine clinic November with negative RA serology and normal inflammatory markers. Some improvement in swelling with Humira but continues to have a lot of pain on a daily basis, worst in knees with known OA also in both hands, right elbow, left shoulder worse. She had injections to her knees with only a few days of benefit. On discussion of lab findings and notes reviewed, patient reports previously thought to have sarcoid with abnormal CXR and eye inflammation problems but was not on disease specific treatment at that time. She also had abnormal labs concerning for lupus for years when younger does not recall the exact details.     Labs reviewed 10/26/22 ANA pos RF neg CCP neg ESR wnl CRP wnl Vit D 1,25 wnl   Previous HPI 04/18/22 Robin [name] is a 52 y.o. female here for seronegative RA, previously a patient with Dr. Kathlene November. She was  taking Humira previously interrupted due to loss of coverage and leflunomide with questionable response in symptoms. She was diagnosed after visit in 11/2019 with multiple areas of joint pain and frequent swelling particularly in bilateral wrists.  Laboratory work-up at that time including rheumatoid factor, CCP, ACE, HLA-B27, sed rate, and CRP were all negative.  She was started on methotrexate and the leflunomide with lack of response in her symptoms.  She started on Humira for this and took it for a few months but was then off the medication for at least about a year due to change in insurance status after losing her job.  She restarted this since 2 months ago so far has not seen a very impressive change in her symptoms.  She is significantly limited by severe lower back pain with degenerative arthritis pursuing disability for this.  Also has advanced osteoarthritis of bilateral knees.  Shoulder pain limits overhead movement or worsened with pressure.  Gets chronic hand pain worse around the thumbs but especially on the radial side of her wrist with fairly chronic swelling currently worse than normal on the right side.  She has had local steroid joint injections in multiple areas but none in the past year these were typically beneficial for at least weeks in the past.  For her osteoarthritis pain she takes diclofenac twice daily with a pretty  good benefit has not experienced any side effects or problems taking this medicine.    DMARD Hx Humira - Current, loss of response? MTX and LEF - inadequate response   Review of Systems  Constitutional:  Positive for fatigue.  HENT:  Positive for mouth dryness. Negative for mouth sores.   Eyes:  Positive for dryness.  Respiratory:  Positive for shortness of breath.   Cardiovascular:  Positive for chest pain. Negative for palpitations.  Gastrointestinal:  Positive for constipation. Negative for blood in stool and diarrhea.  Endocrine: Negative for increased  urination.  Genitourinary:  Positive for involuntary urination.  Musculoskeletal:  Positive for joint pain, gait problem, joint pain, joint swelling, myalgias, muscle weakness, morning stiffness, muscle tenderness and myalgias.  Skin:  Positive for color change, rash, hair loss and sensitivity to sunlight.  Allergic/Immunologic: Positive for susceptible to infections.  Neurological:  Negative for dizziness and headaches.  Hematological:  Negative for swollen glands.  Psychiatric/Behavioral:  Positive for depressed mood and sleep disturbance. The patient is nervous/anxious.     PMFS History:  Patient Active Problem List   Diagnosis Date Noted   Positive ANA (antinuclear antibody) 12/05/2022   Vitamin D deficiency 11/27/2022   High risk medication use 04/19/2022   Financial insecurity 02/13/2022   Anxiety and depression 01/30/2022   Hypercholesterolemia 01/30/2022   Primary hypertension 01/30/2022   Rheumatoid arthritis of multiple sites with negative rheumatoid factor (HCC) 01/30/2022   Osteoarthritis of left knee 06/01/2021   Lumbar spinal stenosis status post L2-L3 TLIF 05/05/2021   Degenerative scoliosis 04/18/2021   Sarcoidosis 05/10/2020   Pain of left hip joint 03/24/2020   Cervical spondylosis without myelopathy 07/16/2019   Neck pain 07/16/2019   Obesity 10/18/2018   Primary osteoarthritis of both knees 10/18/2018   Hammertoe of right foot 07/02/2018   OSA (obstructive sleep apnea) 10/26/2014    Past Medical History:  Diagnosis Date   Allergy    Anxiety    Asthma    Depression    GERD (gastroesophageal reflux disease)    Hyperlipidemia    Hypertension    OSA (obstructive sleep apnea)    Pneumonia    Rheumatoid arthritis (HCC)    Sarcoidosis     History reviewed. No pertinent family history. Past Surgical History:  Procedure Laterality Date   ABDOMINAL HYSTERECTOMY     DEBRIDEMENT AND CLOSURE WOUND N/A 06/15/2021   Procedure: Irrigation and debridement and  closure of spine wound;  Surgeon: Venita Lick, MD;  Location: [facility] OR;  Service: Orthopedics;  Laterality: N/A;   ESOPHAGOGASTRODUODENOSCOPY     FOOT SURGERY Bilateral    Both surgeries for bunions by Dr. Wynelle Cleveland per pt   TRANSFORAMINAL LUMBAR INTERBODY FUSION (TLIF) WITH PEDICLE SCREW FIXATION 1 LEVEL N/A 05/05/2021   Procedure: TRANSFORAMINAL LUMBAR INTERBODY FUSION (TLIF) WITH PEDICLE SCREW FIXATION 1 LEVEL L2-3;  Surgeon: Venita Lick, MD;  Location: [facility] OR;  Service: Orthopedics;  Laterality: N/A;  4 hrs   Social History   Social History Narrative   Not on file   Immunization History  Administered Date(s) Administered   Influenza,inj,quad, With Preservative 09/25/2019   Influenza-Unspecified 10/20/2017   MODERNA COVID-19 SARS-COV-2 PEDS BIVALENT BOOSTER 6Y-11Y 01/07/2020, 02/04/2020   Unspecified SARS-COV-2 Vaccination 01/07/2020, 02/04/2020     Objective: Vital Signs: BP (!) 143/82 (BP Location: Right Arm, Patient Position: Sitting, Cuff Size: Large)   Pulse 77   Resp 18   Ht 5\' 10"  (1.778 m)   Wt (!) 327 lb (148.3 kg)  BMI 46.92 kg/m    Physical Exam Constitutional:      Appearance: She is obese.  Eyes:     Conjunctiva/sclera: Conjunctivae normal.  Cardiovascular:     Rate and Rhythm: Normal rate and regular rhythm.  Pulmonary:     Effort: Pulmonary effort is normal.     Breath sounds: Normal breath sounds.  Skin:    General: Skin is warm and dry.     Findings: Rash present.     Comments: Peripheral skin darkening on face over cheeks and jaw with central clearing no edema or focal lesions  Neurological:     Mental Status: She is alert.  Psychiatric:        Mood and Affect: Mood normal.      Musculoskeletal Exam:  Shoulders full ROM left shoulder tenderness to palpation and pain with abduction but no palpable swelling Elbows full ROM no tenderness or swelling There is right wrist swelling and overlying cyst on dorsal side no decrease in range of motion,  left thumb swelling involving CMC and MCP joint with tenderness to pressure range of motion is intact Bilateral knee crepitus and mild joint line tenderness to pressure slightly restricted range of motion no palpable effusions   CDAI Exam: CDAI Score: 14  Patient Global: 50 mm; Provider Global: 30 mm Swollen: 3 ; Tender: 5  Joint Exam 01/10/2023      Right  Left  Glenohumeral      Tender  Wrist  Swollen      CMC     Swollen Tender  MCP 1     Swollen Tender  Knee   Tender   Tender       Investigation: No additional findings.  Imaging: [place] LIMITED JOINT SPACE STRUCTURES LOW BILAT  Result Date: 01/23/2023 Procedure: Real-time Ultrasound Guided injection of the left knee Device: Samsung [id number] Verbal informed consent obtained. Time-out conducted. Noted no overlying erythema, induration, or other signs of local infection. Skin prepped in a sterile fashion. Local anesthesia: Topical Ethyl chloride. With sterile technique and under real time ultrasound guidance: Mild effusion noted, 30 mg/2 mL of OrthoVisc (sodium hyaluronate) in a prefilled syringe was injected easily into the knee through a 22-gauge needle. Completed without difficulty Advised to call if fevers/chills, erythema, induration, drainage, or persistent bleeding. Images permanently stored and available for review in PACS. Impression: Technically successful ultrasound guided injection.  Procedure: Real-time Ultrasound Guided injection of the right knee Device: Samsung HS60 Verbal informed consent obtained. Time-out conducted. Noted no overlying erythema, induration, or other signs of local infection. Skin prepped in a sterile fashion. Local anesthesia: Topical Ethyl chloride. With sterile technique and under real time ultrasound guidance: Mild effusion noted, 30 mg/2 mL of OrthoVisc (sodium hyaluronate) in a prefilled syringe was injected easily into the knee through a 22-gauge needle. Completed without difficulty Advised to call if  fevers/chills, erythema, induration, drainage, or persistent bleeding. Images permanently stored and available for review in PACS. Impression: Technically successful ultrasound guided injection.   [place] LIMITED JOINT SPACE STRUCTURES LOW BILAT  Result Date: 01/16/2023 Procedure: Real-time Ultrasound Guided injection of the left knee Device: Samsung [id number] Verbal informed consent obtained. Time-out conducted. Noted no overlying erythema, induration, or other signs of local infection. Skin prepped in a sterile fashion. Local anesthesia: Topical Ethyl chloride. With sterile technique and under real time ultrasound guidance: Mild effusion noted, 30 mg/2 mL of OrthoVisc (sodium hyaluronate) in a prefilled syringe was injected easily into the knee through a 22-gauge needle. Completed  without difficulty Advised to call if fevers/chills, erythema, induration, drainage, or persistent bleeding. Images permanently stored and available for review in PACS. Impression: Technically successful ultrasound guided injection.  Procedure: Real-time Ultrasound Guided injection of the right knee Device: Samsung HS60 Verbal informed consent obtained. Time-out conducted. Noted no overlying erythema, induration, or other signs of local infection. Skin prepped in a sterile fashion. Local anesthesia: Topical Ethyl chloride. With sterile technique and under real time ultrasound guidance: Mild effusion noted, 30 mg/2 mL of OrthoVisc (sodium hyaluronate) in a prefilled syringe was injected easily into the knee through a 22-gauge needle. Completed without difficulty Advised to call if fevers/chills, erythema, induration, drainage, or persistent bleeding. Images permanently stored and available for review in PACS. Impression: Technically successful ultrasound guided injection.   XR Knee 1-2 Views Left  Result Date: 01/10/2023 Standing AP both knees lateral left knee sunrise patella x-ray demonstrates severe left knee osteoarthritis medial  compartment bone-on-bone erosive changes mild varus.  Several osteophytes posteriorly and severe patellofemoral degenerative changes. Impression: Severe left knee arthritis with mild varus.  XR Knee 1-2 Views Right  Result Date: 01/10/2023 Standing AP both knees lateral right knee sunrise patella demonstrates a severe knee arthritis mild varus bone-on-bone changes medial compartment with large osteophytes patellofemoral and posterior joint line. Impression severe knee osteoarthritis marginal osteophytes subchondral sclerosis medial compartment erosion and multiple loose bodies.  DG INJECT DIAG/THERA/INC NEEDLE/CATH/PLC EPI/LUMB/SAC W/IMG  Result Date: 01/03/2023 CLINICAL DATA:  52 year old female with lumbosacral spondylosis without myelopathy. She is status post prior L2-L3 posterior lumbar interbody fusion with disc prosthesis implanted. She has adjacent level disease at L3-L4 and significant pain. She presents for L3-L4 epidural steroid injection. FLUOROSCOPY: Radiation Exposure Index (as provided by the fluoroscopic device): 8.3 mGy Kerma PROCEDURE: The procedure, risks, benefits, and alternatives were explained to the patient. Questions regarding the procedure were encouraged and answered. The patient understands and consents to the procedure. LUMBAR EPIDURAL INJECTION: An interlaminar approach was performed on left at L3-L4. The overlying skin was cleansed and anesthetized. A 20 gauge epidural needle was advanced using loss-of-resistance technique. DIAGNOSTIC EPIDURAL INJECTION: Injection of Isovue-M 200 shows a good epidural pattern with spread above and below the level of needle placement, primarily on the left no vascular opacification is seen. THERAPEUTIC EPIDURAL INJECTION: 80 mg of Depo-Medrol mixed with 2 mL 1% lidocaine were instilled. The procedure was well-tolerated, and the patient was discharged thirty minutes following the injection in good condition. COMPLICATIONS: None. IMPRESSION:  Technically successful epidural injection on the left L3-L4. Electronically Signed   By: Jacqulynn Cadet M.D.   On: 01/03/2023 13:31   [place] LIMITED JOINT SPACE STRUCTURES LOW BILAT  Result Date: 01/02/2023 Procedure: Real-time Ultrasound Guided injection of the left knee Device: Samsung HS60 Verbal informed consent obtained. Time-out conducted. Noted no overlying erythema, induration, or other signs of local infection. Skin prepped in a sterile fashion. Local anesthesia: Topical Ethyl chloride. With sterile technique and under real time ultrasound guidance: Mild effusion noted, Orthovisc Completed without difficulty Advised to call if fevers/chills, erythema, induration, drainage, or persistent bleeding. Images permanently stored and available for review in PACS. Impression: Technically successful ultrasound guided injection.  Procedure: Real-time Ultrasound Guided injection of the right knee Device: Samsung HS60 Verbal informed consent obtained. Time-out conducted. Noted no overlying erythema, induration, or other signs of local infection. Skin prepped in a sterile fashion. Local anesthesia: Topical Ethyl chloride. With sterile technique and under real time ultrasound guidance: Mild effusion noted, Orthovisc Completed without difficulty Advised to  call if fevers/chills, erythema, induration, drainage, or persistent bleeding. Images permanently stored and available for review in PACS. Impression: Technically successful ultrasound guided injection.    Recent Labs: Lab Results  Component Value Date   WBC 11.5 (H) 01/10/2023   HGB 13.8 01/10/2023   PLT 327 01/10/2023   NA 143 01/10/2023   K 3.7 01/10/2023   CL 105 01/10/2023   CO2 27 01/10/2023   GLUCOSE 87 01/10/2023   BUN 13 01/10/2023   CREATININE 0.81 01/10/2023   BILITOT 0.5 01/10/2023   ALKPHOS 96 04/19/2022   AST 19 01/10/2023   ALT 19 01/10/2023   PROT 7.1 01/10/2023   ALBUMIN 3.5 04/19/2022   CALCIUM 9.1 01/10/2023   GFRAA  10/12/2009     >60        The eGFR has been calculated using the MDRD equation. This calculation has not been validated in all clinical situations. eGFR's persistently <60 mL/min signify possible Chronic Kidney Disease.   QFTBGOLDPLUS Negative 04/19/2022    Speciality Comments: No specialty comments available.  Procedures:  No procedures performed Allergies: Lisinopril   Assessment / Plan:     Visit Diagnoses: Rheumatoid arthritis of multiple sites with negative rheumatoid factor (HCC) - Plan: Sedimentation rate, C-reactive protein  Because joint pain complaint is coming from her knees to his primary osteoarthritis but there are some swollen upper extremity joints as well suggesting inflammatory disease activity.  Will check sed rate and CRP for disease activity monitoring.  Discussed possibility of switching biologic DMARD option from Humira current maintenance treatment. Recommendation would be to consider tocilizumab subcu provided information to review on this.  High risk medication use - Plan: CBC with Differential/Platelet, COMPLETE METABOLIC PANEL WITH GFR, Lipid panel  Checking CBC and CMP for medication monitoring on continued Humira treatment.  No interval infections.  Considering switch to alternate medication will check lipid panel baseline.  Discussed risks of tocilizumab including allergic reaction, injection site reaction, neutropenia hyperlipidemia infections.  She has no history of blood clots or diverticulitis.  Sarcoidosis  With history of sarcoidosis but despite overall increase in symptoms no pulmonary changes in her skin rashes do not appear typical for cutaneous sarcoid.  Orders: Orders Placed This Encounter  Procedures   Sedimentation rate   C-reactive protein   CBC with Differential/Platelet   COMPLETE METABOLIC PANEL WITH GFR   Lipid panel   No orders of the defined types were placed in this encounter.    Follow-Up Instructions: Return in about 2 months  (around 03/11/2023) for RA TOC start f/u 21mos.   Fuller Plan, MD  Note - This record has been created using [employer].  Chart creation errors have been sought, but may not always  have been located. Such creation errors do not reflect on  the standard of medical care.

## 2023-01-10 ENCOUNTER — Encounter: Payer: Self-pay | Admitting: Internal Medicine

## 2023-01-10 ENCOUNTER — Ambulatory Visit: Payer: Medicaid Other | Attending: Internal Medicine | Admitting: Internal Medicine

## 2023-01-10 VITALS — BP 143/82 | HR 77 | Resp 18 | Ht 70.0 in | Wt 327.0 lb

## 2023-01-10 DIAGNOSIS — Z79899 Other long term (current) drug therapy: Secondary | ICD-10-CM

## 2023-01-10 DIAGNOSIS — R768 Other specified abnormal immunological findings in serum: Secondary | ICD-10-CM | POA: Diagnosis not present

## 2023-01-10 DIAGNOSIS — M0609 Rheumatoid arthritis without rheumatoid factor, multiple sites: Secondary | ICD-10-CM

## 2023-01-10 DIAGNOSIS — D869 Sarcoidosis, unspecified: Secondary | ICD-10-CM

## 2023-01-10 NOTE — Patient Instructions (Signed)
Tocilizumab Injection What is this medication? TOCILIZUMAB (TOE si LIZ ue mab) treats autoimmune conditions, such as arthritis. It works by slowing down an overactive immune system. It may also be used to treat severe COVID-19 in people who are hospitalized. It is a monoclonal antibody. This medicine may be used for other purposes; ask your health care provider or pharmacist if you have questions. COMMON BRAND NAME(S): Actemra What should I tell my care team before I take this medication? They need to know if you have any of these conditions: Cancer Diabetes Heart disease History of or current hepatitis B infection High blood pressure High cholesterol Immune system problems Infection Liver disease Low blood counts, such as low white cell, platelet, or red cell counts Multiple sclerosis Recent or upcoming vaccine Stomach or intestine problems Stroke An unusual or allergic reaction to tocilizumab, other medications, foods, dyes, or preservatives Pregnant or trying to get pregnant Breast-feeding How should I use this medication? This medication is injected into a vein or under the skin. It may be given by your care team in a hospital or clinic setting. It may also be given at home. If you get this medication at home, you will be taught how to prepare and give it. Use it exactly as directed. Take it as directed on the prescription label. Keep taking it unless your care team tells you to stop. If you use a pen, be sure to take off the outer needle cover before using the dose. It is important that you put your used needles and syringes in a special sharps container. Do not put them in a trash can. If you do not have a sharps container, call your pharmacist or care team to get one. A special MedGuide will be given to you by the pharmacist with each prescription and refill. Be sure to read this information carefully each time. Talk to your care team about the use of this medication in children.  While it may be prescribed for children as young as 2 years for selected conditions, precautions do apply. Overdosage: If you think you have taken too much of this medicine contact a poison control center or emergency room at once. NOTE: This medicine is only for you. Do not share this medicine with others. What if I miss a dose? If you get this medication at the hospital or clinic: It is important not to miss your dose. Call your care team if you are unable to keep an appointment. If you give yourself this medication at home: If you miss a dose, take it as soon as you can. If it is almost time for your next dose, take only that dose. Do not take double or extra doses. Call your care team with questions. What may interact with this medication? Do not take this medication with any of the following: Live virus vaccines This medication may also interact with the following: Biologic medications, such as abatacept, adalimumab, anakinra, certolizumab, etanercept, golimumab, infliximab, rituximab, secukinumab, ustekinumab Certain medications for cholesterol, such as atorvastatin, lovastatin, simvastatin Cyclosporine Estrogen or progestin hormones Omeprazole Steroid medications, such as prednisone or cortisone Theophylline Vaccines Warfarin This list may not describe all possible interactions. Give your health care provider a list of all the medicines, herbs, non-prescription drugs, or dietary supplements you use. Also tell them if you smoke, drink alcohol, or use illegal drugs. Some items may interact with your medicine. What should I watch for while using this medication? Visit your care team for regular checks on   your progress. Your condition will be monitored carefully while you are receiving this medication. Tell your care team if your symptoms do not start to get better or if they get worse. You may need blood work done while taking this medication. You will be tested for tuberculosis (TB) before  you start this medication. If your care team prescribes any medication for TB, you should start taking the TB medication before starting this medication. Make sure to finish the full course of TB medication. This medication may increase your risk of getting an infection. Call your care team for advice if you get a fever, chills, sore throat, or other symptoms of a cold or flu. Do not treat yourself. Try to avoid being around people who are sick. Talk to your care team about your risk of cancer. You may be more at risk for certain types of cancers if you take this medication. What side effects may I notice from receiving this medication? Side effects that you should report to your care team as soon as possible: Allergic reactions--skin rash, itching, hives, swelling of the face, lips, tongue, or throat Infection--fever, chills, cough, sore throat, wounds that don't heal, pain or trouble when passing urine, general feeling of discomfort or being unwell Liver injury--right upper belly pain, loss of appetite, nausea, light-colored stool, dark yellow or brown urine, yellowing skin or eyes, unusual weakness or fatigue Stomach pain that is severe, does not go away, or gets worse Unusual bruising or bleeding Side effects that usually do not require medical attention (report to your care team if they continue or are bothersome): Dizziness Headache Increase in blood pressure Pain, redness, or irritation at injection site Runny or stuffy nose Sore throat Stomach pain This list may not describe all possible side effects. Call your doctor for medical advice about side effects. You may report side effects to FDA at 1-800-FDA-1088. Where should I keep my medication? Keep out of the reach of children and pets. You will be instructed on how to store this medication. Get rid of any unused medication after the expiration date on the label. To get rid of medications that are no longer needed or have expired: Take  the medication to a medication take-back program. Check with your pharmacy or law enforcement to find a location. If you cannot return the medication, ask your pharmacist or care team how to get rid of this medication safely. NOTE: This sheet is a summary. It may not cover all possible information. If you have questions about this medicine, talk to your doctor, pharmacist, or health care provider.  2023 Elsevier/Gold Standard (2022-03-17 00:00:00)  

## 2023-01-11 ENCOUNTER — Ambulatory Visit: Payer: Medicaid Other | Admitting: Sports Medicine

## 2023-01-11 ENCOUNTER — Telehealth: Payer: Self-pay | Admitting: *Deleted

## 2023-01-11 ENCOUNTER — Telehealth: Payer: Self-pay | Admitting: Pharmacist

## 2023-01-11 LAB — C-REACTIVE PROTEIN: CRP: 2.4 mg/L (ref <8.0)

## 2023-01-11 LAB — LIPID PANEL
Cholesterol: 175 mg/dL (ref <200)
HDL: 49 mg/dL — ABNORMAL LOW (ref >=50)
LDL Cholesterol (Calc): 95 mg/dL (calc)
Non-HDL Cholesterol (Calc): 126 mg/dL (calc) (ref <130)
Total CHOL/HDL Ratio: 3.6 (calc) (ref <5.0)
Triglycerides: 221 mg/dL — ABNORMAL HIGH (ref <150)

## 2023-01-11 LAB — CBC WITH DIFFERENTIAL/PLATELET
Absolute Monocytes: 909 cells/uL (ref 200–950)
Basophils Absolute: 46 cells/uL (ref 0–200)
Basophils Relative: 0.4 %
Eosinophils Absolute: 150 cells/uL (ref 15–500)
Eosinophils Relative: 1.3 %
HCT: 41.6 % (ref 35.0–45.0)
Hemoglobin: 13.8 g/dL (ref 11.7–15.5)
Lymphs Abs: 5118 cells/uL — ABNORMAL HIGH (ref 850–3900)
MCH: 27.8 pg (ref 27.0–33.0)
MCHC: 33.2 g/dL (ref 32.0–36.0)
MCV: 83.9 fL (ref 80.0–100.0)
MPV: 10.7 fL (ref 7.5–12.5)
Monocytes Relative: 7.9 %
Neutro Abs: 5279 cells/uL (ref 1500–7800)
Neutrophils Relative %: 45.9 %
Platelets: 327 10*3/uL (ref 140–400)
RBC: 4.96 10*6/uL (ref 3.80–5.10)
RDW: 11.9 % (ref 11.0–15.0)
Total Lymphocyte: 44.5 %
WBC: 11.5 10*3/uL — ABNORMAL HIGH (ref 3.8–10.8)

## 2023-01-11 LAB — COMPLETE METABOLIC PANEL WITH GFR
AG Ratio: 1.2 (calc) (ref 1.0–2.5)
ALT: 19 U/L (ref 6–29)
AST: 19 U/L (ref 10–35)
Albumin: 3.9 g/dL (ref 3.6–5.1)
Alkaline phosphatase (APISO): 91 U/L (ref 37–153)
BUN: 13 mg/dL (ref 7–25)
CO2: 27 mmol/L (ref 20–32)
Calcium: 9.1 mg/dL (ref 8.6–10.4)
Chloride: 105 mmol/L (ref 98–110)
Creat: 0.81 mg/dL (ref 0.50–1.03)
Globulin: 3.2 g/dL (calc) (ref 1.9–3.7)
Glucose, Bld: 87 mg/dL (ref 65–99)
Potassium: 3.7 mmol/L (ref 3.5–5.3)
Sodium: 143 mmol/L (ref 135–146)
Total Bilirubin: 0.5 mg/dL (ref 0.2–1.2)
Total Protein: 7.1 g/dL (ref 6.1–8.1)
eGFR: 87 mL/min/{1.73_m2} (ref >=60)

## 2023-01-11 LAB — SEDIMENTATION RATE: Sed Rate: 9 mm/h (ref 0–30)

## 2023-01-11 NOTE — Telephone Encounter (Signed)
Patient contacted the office stating you spoke with her about Actemra at her office visit on 01/10/2023. Patient states she has been reading over the side effects and she would like to have a safer option. Patient states she does not like to potential side effects of Actemra.

## 2023-01-11 NOTE — Telephone Encounter (Addendum)
Please start ACTEMRA SQ BIV.  Dose: 162MG  every 7 days (due to weight > 100kg)  Dx: RA  Previously tried therapies: Humira - waning clinical respones MTX - inadequate clinical response Leflunomide - inadequate clinical response  Knox Saliva, PharmD, MPH, BCPS, CPP Clinical Pharmacist (Rheumatology and Pulmonology)  ----- Message from Shona Needles, RT sent at 01/10/2023  3:56 PM EST ----- Regarding: NEW START ACTEMRA

## 2023-01-11 NOTE — Progress Notes (Signed)
Lab results look fine for changing medications as planned. Her triglycerides are slightly high but this was not a fasting blood draw and her total cholesterol looks fine anyways.

## 2023-01-12 NOTE — Telephone Encounter (Signed)
Hold off on BIV for Actemra yet. See recent chart message routed to MD that patient does not want to try med.

## 2023-01-16 ENCOUNTER — Ambulatory Visit (INDEPENDENT_AMBULATORY_CARE_PROVIDER_SITE_OTHER): Payer: Medicaid Other | Admitting: Sports Medicine

## 2023-01-16 ENCOUNTER — Ambulatory Visit (INDEPENDENT_AMBULATORY_CARE_PROVIDER_SITE_OTHER): Payer: Medicaid Other

## 2023-01-16 DIAGNOSIS — M170 Bilateral primary osteoarthritis of knee: Secondary | ICD-10-CM

## 2023-01-16 MED ORDER — HYALURONAN 30 MG/2ML IX SOSY
30.0000 mg | PREFILLED_SYRINGE | Freq: Once | INTRA_ARTICULAR | Status: AC
  Administered 2023-01-16: 30 mg via INTRA_ARTICULAR

## 2023-01-16 NOTE — Progress Notes (Addendum)
    Procedures performed today:    Procedure: Real-time Ultrasound Guided injection of the left knee Device: Samsung HS60  Verbal informed consent obtained.  Time-out conducted.  Noted no overlying erythema, induration, or other signs of local infection.  Skin prepped in a sterile fashion.  Local anesthesia: Topical Ethyl chloride.  With sterile technique and under real time ultrasound guidance: Mild effusion noted, 30 mg/2 mL of OrthoVisc (sodium hyaluronate) in a prefilled syringe was injected easily into the knee through a 22-gauge needle. Completed without difficulty  Advised to call if fevers/chills, erythema, induration, drainage, or persistent bleeding.  Images permanently stored and available for review in PACS.  Impression: Technically successful ultrasound guided injection.   Procedure: Real-time Ultrasound Guided injection of the right knee Device: Samsung HS60  Verbal informed consent obtained.  Time-out conducted.  Noted no overlying erythema, induration, or other signs of local infection.  Skin prepped in a sterile fashion.  Local anesthesia: Topical Ethyl chloride.  With sterile technique and under real time ultrasound guidance: Mild effusion noted, 30 mg/2 mL of OrthoVisc (sodium hyaluronate) in a prefilled syringe was injected easily into the knee through a 22-gauge needle. Completed without difficulty  Advised to call if fevers/chills, erythema, induration, drainage, or persistent bleeding.  Images permanently stored and available for review in PACS.  Impression: Technically successful ultrasound guided injection.  Independent interpretation of notes and tests performed by another provider:   None.  Brief History, Exam, Impression, and Recommendations:    Primary osteoarthritis of both knees Orthovisc 2 of 4 both knees, return in 1 week for #3 of 4. Already starting to feel some improvement.    ____________________________________________ Gwen Her.  Dianah Field, M.D., ABFM., CAQSM., AME. Primary Care and Sports Medicine [place] [facility]  Adjunct Professor of [place] [employer] of [employer]  Risk manager

## 2023-01-16 NOTE — Assessment & Plan Note (Signed)
Orthovisc 2 of 4 both knees, return in 1 week for #3 of 4. Already starting to feel some improvement.

## 2023-01-23 ENCOUNTER — Ambulatory Visit (INDEPENDENT_AMBULATORY_CARE_PROVIDER_SITE_OTHER): Payer: Medicaid Other | Admitting: Sports Medicine

## 2023-01-23 ENCOUNTER — Ambulatory Visit (INDEPENDENT_AMBULATORY_CARE_PROVIDER_SITE_OTHER): Payer: Medicaid Other

## 2023-01-23 DIAGNOSIS — M170 Bilateral primary osteoarthritis of knee: Secondary | ICD-10-CM

## 2023-01-23 MED ORDER — HYALURONAN 30 MG/2ML IX SOSY
30.0000 mg | PREFILLED_SYRINGE | Freq: Once | INTRA_ARTICULAR | Status: AC
  Administered 2023-01-23: 30 mg via INTRA_ARTICULAR

## 2023-01-23 NOTE — Telephone Encounter (Signed)
If patient would like to discuss other treatment options, she will need to make an office visit to review. Actemra was decided at visit. Her current appointment is scheduled for March 2024. If she'd like to be sooner, she can reschedule. Dr. Rice stated that he is willing to to video visit if patient would prefer that vs coming on site.  Routing to scheduling team  for assistance  Labrandon Knoch, PharmD, MPH, BCPS, CPP Clinical Pharmacist (Rheumatology and Pulmonology) 

## 2023-01-23 NOTE — Addendum Note (Signed)
Addended by: Tarri Glenn A on: 01/23/2023 03:23 PM   Modules accepted: Orders

## 2023-01-23 NOTE — Progress Notes (Signed)
If patient would like to discuss other treatment options, she will need to make an office visit to review. Actemra was decided at visit. Her current appointment is scheduled for March 2024. If she'd like to be sooner, she can reschedule. Dr. Benjamine Mola stated that he is willing to to video visit if patient would prefer that vs coming on site.  Routing to scheduling team  for assistance  Knox Saliva, PharmD, MPH, BCPS, CPP Clinical Pharmacist (Rheumatology and Pulmonology)

## 2023-01-23 NOTE — Assessment & Plan Note (Signed)
Orthovisc 3 of 4 today, return in 1 week for Orthovisc No. 4 oh for both knees

## 2023-01-23 NOTE — Progress Notes (Signed)
    Procedures performed today:    Procedure: Real-time Ultrasound Guided injection of the left knee Device: Samsung HS60  Verbal informed consent obtained.  Time-out conducted.  Noted no overlying erythema, induration, or other signs of local infection.  Skin prepped in a sterile fashion.  Local anesthesia: Topical Ethyl chloride.  With sterile technique and under real time ultrasound guidance: Mild effusion noted, 30 mg/2 mL of OrthoVisc (sodium hyaluronate) in a prefilled syringe was injected easily into the knee through a 22-gauge needle. Completed without difficulty  Advised to call if fevers/chills, erythema, induration, drainage, or persistent bleeding.  Images permanently stored and available for review in PACS.  Impression: Technically successful ultrasound guided injection.   Procedure: Real-time Ultrasound Guided injection of the right knee Device: Samsung HS60  Verbal informed consent obtained.  Time-out conducted.  Noted no overlying erythema, induration, or other signs of local infection.  Skin prepped in a sterile fashion.  Local anesthesia: Topical Ethyl chloride.  With sterile technique and under real time ultrasound guidance: Mild effusion noted, 30 mg/2 mL of OrthoVisc (sodium hyaluronate) in a prefilled syringe was injected easily into the knee through a 22-gauge needle. Completed without difficulty  Advised to call if fevers/chills, erythema, induration, drainage, or persistent bleeding.  Images permanently stored and available for review in PACS.  Impression: Technically successful ultrasound guided injection.  Independent interpretation of notes and tests performed by another provider:   None.  Brief History, Exam, Impression, and Recommendations:    Primary osteoarthritis of both knees Orthovisc 3 of 4 today, return in 1 week for Orthovisc No. 4 oh for both knees    ____________________________________________ Gwen Her. Dianah Field, M.D., ABFM.,  CAQSM., AME. Primary Care and Sports Medicine [place] [facility]  Adjunct Professor of [place] [employer] of Medicine  Risk manager

## 2023-01-24 NOTE — Progress Notes (Signed)
Patient scheduled for Monday, 02/19/23 at 8:00 am

## 2023-01-29 ENCOUNTER — Ambulatory Visit (INDEPENDENT_AMBULATORY_CARE_PROVIDER_SITE_OTHER): Payer: Medicaid Other | Admitting: Sports Medicine

## 2023-01-29 ENCOUNTER — Ambulatory Visit (INDEPENDENT_AMBULATORY_CARE_PROVIDER_SITE_OTHER): Payer: Medicaid Other

## 2023-01-29 DIAGNOSIS — M170 Bilateral primary osteoarthritis of knee: Secondary | ICD-10-CM | POA: Diagnosis not present

## 2023-01-29 DIAGNOSIS — M0609 Rheumatoid arthritis without rheumatoid factor, multiple sites: Secondary | ICD-10-CM

## 2023-01-29 NOTE — Assessment & Plan Note (Addendum)
Robin [name] does have seronegative rheumatoid arthritis currently on Humira, it sounds like this is ineffective and she will be switched to something else. I did offer a burst of prednisone, she declines, we will have her do the anti-inflammatory diet and strongly consider the next medication with her rheumatologist, she has the appointment in about 3 weeks.

## 2023-01-29 NOTE — Patient Instructions (Signed)

## 2023-01-29 NOTE — Progress Notes (Signed)
    Procedures performed today:    Procedure: Real-time Ultrasound Guided injection of the left knee Device: Samsung HS60  Verbal informed consent obtained.  Time-out conducted.  Noted no overlying erythema, induration, or other signs of local infection.  Skin prepped in a sterile fashion.  Local anesthesia: Topical Ethyl chloride.  With sterile technique and under real time ultrasound guidance: Mild effusion noted, 30 mg/2 mL of OrthoVisc (sodium hyaluronate) in a prefilled syringe was injected easily into the knee through a 22-gauge needle. Completed without difficulty  Advised to call if fevers/chills, erythema, induration, drainage, or persistent bleeding.  Images permanently stored and available for review in PACS.  Impression: Technically successful ultrasound guided injection.   Procedure: Real-time Ultrasound Guided injection of the right knee Device: Samsung HS60  Verbal informed consent obtained.  Time-out conducted.  Noted no overlying erythema, induration, or other signs of local infection.  Skin prepped in a sterile fashion.  Local anesthesia: Topical Ethyl chloride.  With sterile technique and under real time ultrasound guidance: Mild effusion noted, 30 mg/2 mL of OrthoVisc (sodium hyaluronate) in a prefilled syringe was injected easily into the knee through a 22-gauge needle. Completed without difficulty  Advised to call if fevers/chills, erythema, induration, drainage, or persistent bleeding.  Images permanently stored and available for review in PACS.  Impression: Technically successful ultrasound guided injection.  Independent interpretation of notes and tests performed by another provider:   None.  Brief History, Exam, Impression, and Recommendations:    Primary osteoarthritis of both knees Bilateral Orthovisc 4 of 4 today, return in 6 weeks.  Rheumatoid arthritis of multiple sites with negative rheumatoid factor ([place]) [name] does have seronegative rheumatoid  arthritis currently on Humira, it sounds like this is ineffective and she will be switched to something else. I did offer a burst of prednisone, she declines, we will have her do the anti-inflammatory diet and strongly consider the next medication with [name] rheumatologist, she has the appointment in about 3 weeks.    ____________________________________________ Robin [name]. Dianah Field, M.D., ABFM., CAQSM., AME. Primary Care and Sports Medicine [place] [facility]  Adjunct Professor of [place] [employer] [facility] of Medicine  Risk manager

## 2023-01-29 NOTE — Assessment & Plan Note (Signed)
Bilateral Orthovisc 4 of 4 today, return in 6 weeks.

## 2023-01-30 ENCOUNTER — Ambulatory Visit
Admission: RE | Admit: 2023-01-30 | Discharge: 2023-01-30 | Disposition: A | Discharge status: Home / Self Care | Payer: Medicaid Other | Source: Ambulatory Visit | Attending: Physician Assistant | Admitting: Physician Assistant

## 2023-01-30 DIAGNOSIS — M170 Bilateral primary osteoarthritis of knee: Secondary | ICD-10-CM

## 2023-01-30 DIAGNOSIS — M0609 Rheumatoid arthritis without rheumatoid factor, multiple sites: Secondary | ICD-10-CM | POA: Diagnosis not present

## 2023-01-30 DIAGNOSIS — Z1231 Encounter for screening mammogram for malignant neoplasm of breast: Secondary | ICD-10-CM

## 2023-01-30 MED ORDER — HYALURONAN 30 MG/2ML IX SOSY
30.0000 mg | PREFILLED_SYRINGE | Freq: Once | INTRA_ARTICULAR | Status: AC
  Administered 2023-01-30: 30 mg via INTRA_ARTICULAR

## 2023-01-30 NOTE — Addendum Note (Signed)
Addended by: Royetta Car on: 01/30/2023 09:46 AM   Modules accepted: Orders

## 2023-01-31 ENCOUNTER — Encounter: Payer: Self-pay | Admitting: Internal Medicine

## 2023-01-31 ENCOUNTER — Ambulatory Visit: Payer: Medicaid Other | Attending: Internal Medicine | Admitting: Internal Medicine

## 2023-01-31 VITALS — BP 142/82 | HR 73 | Ht 69.0 in | Wt 328.4 lb

## 2023-01-31 DIAGNOSIS — I10 Essential (primary) hypertension: Secondary | ICD-10-CM

## 2023-01-31 DIAGNOSIS — Z6841 Body Mass Index (BMI) 40.0 and over, adult: Secondary | ICD-10-CM | POA: Diagnosis not present

## 2023-01-31 DIAGNOSIS — G4733 Obstructive sleep apnea (adult) (pediatric): Secondary | ICD-10-CM

## 2023-01-31 MED ORDER — CARVEDILOL 12.5 MG PO TABS: 12.5000 mg | ORAL_TABLET | Freq: Two times a day (BID) | ORAL | 5 refills | Status: AC

## 2023-01-31 NOTE — Progress Notes (Signed)
Cardiology Office Note:    Date:  01/31/2023   ID:  Robin [name], DOB October 19, 1971, MRN [id number]  PCP:  Jorge Ny, [place] Providers Cardiologist:  None     Referring MD: Jorge Ny, PA-C   No chief complaint on file. HTN  History of Present Illness:    Robin [name] is a 52 y.o. female with a hx of HTN, ANA + RA on adalimumab referral for HTN  Notes 140/90 mmHg at home. She has a blood pressure cuff at home.  She had a sleep study in the past, prescribed a CPAP.  She lost her insurance when the company she worked for went under    Past Medical History:  Diagnosis Date   Allergy    Anxiety    Asthma    Depression    GERD (gastroesophageal reflux disease)    Hyperlipidemia    Hypertension    OSA (obstructive sleep apnea)    Pneumonia    Rheumatoid arthritis ([place])    Sarcoidosis     Past Surgical History:  Procedure Laterality Date   ABDOMINAL HYSTERECTOMY     DEBRIDEMENT AND CLOSURE WOUND N/A 06/15/2021   Procedure: Irrigation and debridement and closure of spine wound;  Surgeon: Melina Schools, MD;  Location: [place];  Service: Orthopedics;  Laterality: N/A;   ESOPHAGOGASTRODUODENOSCOPY     FOOT SURGERY Bilateral    Both surgeries for bunions by Dr. Janus Molder per pt   TRANSFORAMINAL LUMBAR INTERBODY FUSION (TLIF) WITH PEDICLE SCREW FIXATION 1 LEVEL N/A 05/05/2021   Procedure: TRANSFORAMINAL LUMBAR INTERBODY FUSION (TLIF) WITH PEDICLE SCREW FIXATION 1 LEVEL L2-3;  Surgeon: Melina Schools, MD;  Location: [place];  Service: Orthopedics;  Laterality: N/A;  4 hrs    Current Medications: Current Meds  Medication Sig   Adalimumab (HUMIRA) 40 MG/0.8ML PSKT Inject 40 mg SQ every 2 weeks for rheumatoid arthritis   albuterol (VENTOLIN HFA) 108 (90 Base) MCG/ACT inhaler Inhale 2 puffs into the lungs every 6 (six) hours as needed for wheezing or shortness of breath.   amLODipine (NORVASC) 10 MG tablet Take 1 tablet (10 mg total) by  mouth daily.   atorvastatin (LIPITOR) 20 MG tablet Take 1 tablet (20 mg total) by mouth daily.   buPROPion (WELLBUTRIN SR) 150 MG 12 hr tablet Take 1 tablet (150 mg total) by mouth 2 (two) times daily.   carvedilol (COREG) 6.25 MG tablet Take by mouth.   cetirizine (ZYRTEC) 10 MG tablet Take 1 tablet by mouth daily.   diclofenac (VOLTAREN) 75 MG EC tablet TAKE 1 TABLET BY MOUTH TWICE DAILY WITH MEALS   fluticasone (FLONASE) 50 MCG/ACT nasal spray Place into the nose.   losartan-hydrochlorothiazide (HYZAAR) 100-25 MG tablet Take 1 tablet by mouth daily.   Melatonin 10 MG CAPS Take 20 mg by mouth at bedtime as needed (sleep).   traMADol (ULTRAM) 50 MG tablet Take 1 tablet (50 mg total) by mouth every 8 (eight) hours as needed for moderate pain.     Allergies:   Lisinopril   Social History   Socioeconomic History   Marital status: Divorced    Spouse name: Not on file   Number of children: 1   Years of education: Not on file   Highest education level: Not on file  Occupational History   Occupation: payroll  Tobacco Use   Smoking status: Former    Packs/day: 0.10    Years: 2.00    Total pack years: 0.20  Types: Cigarettes    Quit date: 12/25/1996    Years since quitting: 26.1    Passive exposure: Never   Smokeless tobacco: Never   Tobacco comments:    smoked 2-3 cigs daily x 2 years  Vaping Use   Vaping Use: Never used  Substance and Sexual Activity   Alcohol use: Yes    Alcohol/week: 0.0 standard drinks of alcohol    Comment: rarely   Drug use: No   Sexual activity: Not on file  Other Topics Concern   Not on file  Social History Narrative   Not on file   Social Determinants of Health   Financial Resource Strain: Not on file  Food Insecurity: Not on file  Transportation Needs: Not on file  Physical Activity: Not on file  Stress: Not on file  Social Connections: Not on file     Family History: Mother- CHF Father- HTN  ROS:   Please see the history of present  illness.      All other systems reviewed and are negative.  EKGs/Labs/Other Studies Reviewed:    The following studies were reviewed today:   EKG:  EKG is  ordered today.  The ekg ordered today demonstrates   01/31/2023- NSR  Recent Labs: 01/10/2023: ALT 19; BUN 13; Creat 0.81; Hemoglobin 13.8; Platelets 327; Potassium 3.7; Sodium 143   Recent Lipid Panel    Component Value Date/Time   CHOL 175 01/10/2023 1450   TRIG 221 (H) 01/10/2023 1450   HDL 49 (L) 01/10/2023 1450   CHOLHDL 3.6 01/10/2023 1450   LDLCALC 95 01/10/2023 1450     Risk Assessment/Calculations:         Physical Exam:    VS:   Vitals:   01/31/23 1437  BP: (!) 142/82  Pulse: 73  SpO2: 99%    BP (!) 142/82   Pulse 73   Ht 5\' 9"  (1.753 m)   Wt (!) 328 lb 6.4 oz (149 kg)   SpO2 99%   BMI 48.50 kg/m     Wt Readings from Last 3 Encounters:  01/31/23 (!) 328 lb 6.4 oz (149 kg)  01/10/23 (!) 327 lb (148.3 kg)  12/05/22 (!) 329 lb (149.2 kg)     GEN:  Well nourished, well developed in no acute distress HEENT: Normal NECK: No JVD; No carotid bruits LYMPHATICS: No lymphadenopathy CARDIAC: RRR, no murmurs, rubs, gallops RESPIRATORY:  Clear to auscultation without rales, wheezing or rhonchi  ABDOMEN: Soft, non-tender, non-distended MUSCULOSKELETAL:  No edema; No deformity  SKIN: Warm and dry NEUROLOGIC:  Alert and oriented x 3 PSYCHIATRIC:  Normal affect   ASSESSMENT:    HTN:  not well controlled. Many contributing factors, weight, sleep apnea untreated and pain. -increase coreg to 12.5 mg BID, 6.25 mg BID -losartan-Hctz: 100 mg-25 mg daily -continue norvasc 10 mg daily  Obesity: ?GLP1 / gastric bypass. will send referral to healthy weight and wellness  OSA: sleep referral  PLAN:    In order of problems listed above:  Sleep study pulm referral Healthy weight and wellness referral 3 month visit with pharmacy for BP control Follow up 6 months           Medication Adjustments/Labs  and Tests Ordered: Current medicines are reviewed at length with the patient today.  Concerns regarding medicines are outlined above.  No orders of the defined types were placed in this encounter.  No orders of the defined types were placed in this encounter.   There are no Patient  Instructions on file for this visit.   Signed, Janina Mayo, MD  01/31/2023 2:46 PM    [place]

## 2023-01-31 NOTE — Patient Instructions (Signed)
Medication Instructions:  INCREASE CARVEDILOL TO 12.5mg  TWICE DAILY  *If you need a refill on your cardiac medications before your next appointment, please call your pharmacy*  Lab Work: None Ordered At This Time.  If you have labs (blood work) drawn today and your tests are completely normal, you will receive your results only by: [place] (if you have MyChart) OR A paper copy in the mail If you have any lab test that is abnormal or we need to change your treatment, we will call you to review the results.  Testing/Procedures:  REFERRAL TO PULM FOR SLEEP- SOMEONE WILL REACH OUT TO YOU TO GET YOU SCHEDULED   REFERRAL TO HEALTH WEIGHT AND WELLNESS- SOMEONE WILL REACH OUT TO YOU TO GET YOU SCHEDULED   Follow-Up: At [facility], you and your health needs are our priority.  As part of our continuing mission to provide you with exceptional heart care, we have created designated Provider Care Teams.  These Care Teams include your primary Cardiologist (physician) and Advanced Practice Providers (APPs -  Physician Assistants and Nurse Practitioners) who all work together to provide you with the care you need, when you need it.  PLEASE SCHEDULE PHARMACY APT IN 3 MONTHS FOR BP MANAGEMENT   Your next appointment:   6 month(s)  Provider:   Janina Mayo, MD

## 2023-02-15 ENCOUNTER — Encounter: Payer: Self-pay | Admitting: Nurse Practitioner

## 2023-02-15 ENCOUNTER — Ambulatory Visit (INDEPENDENT_AMBULATORY_CARE_PROVIDER_SITE_OTHER): Payer: Medicaid Other | Admitting: Nurse Practitioner

## 2023-02-15 VITALS — BP 142/86 | HR 78 | Ht 69.0 in | Wt 334.4 lb

## 2023-02-15 DIAGNOSIS — E661 Drug-induced obesity: Secondary | ICD-10-CM | POA: Diagnosis not present

## 2023-02-15 DIAGNOSIS — G4733 Obstructive sleep apnea (adult) (pediatric): Secondary | ICD-10-CM

## 2023-02-15 DIAGNOSIS — Z6841 Body Mass Index (BMI) 40.0 and over, adult: Secondary | ICD-10-CM

## 2023-02-15 NOTE — Progress Notes (Signed)
$@Patients$  ID: Robin [name], female    DOB: [birth year], 52 y.o.   MRN: [id number]  Chief Complaint  Patient presents with   Consult    Pt consult she states that she used to wear a CPAP years ago but she stopped therapy because of insurance changes. Cardiologist referred her bc she believes that is why her HTN isn't controlled.     Referring provider: Janina Mayo, MD  HPI: 52 year old female, former remote smoker referred for sleep consult. Past medical history significant for HTN, OSA not on CPAP, RA, anxiety, depression, vit d deficiency.   TEST/EVENTS:  2010 NPSG: AHI 8/h  02/15/2023: Today - sleep consult Patient presents today for sleep consult, referred by Dr. Harl Bowie.  She was previously seen in our office by Dr. Normajean Baxter in 2015.  She had mild obstructive sleep apnea diagnosed in 2010.  She was started on CPAP therapy and had been on it for many years.  At some point, she lost her insurance and stopped receiving supplies so she stopped wearing the CPAP a few years ago.  She has had a lot of trouble with her sleep, especially over the last year.  She tells me that she wakes up often throughout the night.  Feels anxious when she does.  She has loud snoring.  She feels very tired throughout the day.  Wakes with a dry mouth most mornings.  Denies any morning headaches, drowsy driving, sleep parasomnia/paralysis.  No history of narcolepsy or cataplexy.  No witnessed apneas She goes to bed between 8 PM to 10 PM.  Usually takes a long time to fall asleep, around 4 to 5 hours.  Wakes 3 or more times a night.  Gets up for the day between 7 and 11 AM.  She is currently on disability.  Gained 30 pounds over the last 2 years.  Does not take any sleep aids. She has a history of high blood pressure, which has been difficult to control recently.  That is another reason why she was encouraged to come back for sleep evaluation.  She also has asthma, high cholesterol and allergies. She was a former  remote smoker, quit in 1998.  Drinks alcohol occasionally, usually 1 glass a month.  Drinks 1 to 2 cups of coffee a day.  Lives alone.  Family history of asthma, heart disease, rheumatism and cancer.    Epworth 16  Allergies  Allergen Reactions   Lisinopril Cough    Immunization History  Administered Date(s) Administered   Influenza,inj,quad, With Preservative 09/25/2019   Influenza-Unspecified 10/20/2017   MODERNA COVID-19 SARS-COV-2 PEDS BIVALENT BOOSTER 6Y-11Y 01/07/2020, 02/04/2020   Unspecified SARS-COV-2 Vaccination 01/07/2020, 02/04/2020    Past Medical History:  Diagnosis Date   Allergy    Anxiety    Asthma    Depression    GERD (gastroesophageal reflux disease)    Hyperlipidemia    Hypertension    OSA (obstructive sleep apnea)    Pneumonia    Rheumatoid arthritis ([place])    Sarcoidosis     Tobacco History: Social History   Tobacco Use  Smoking Status Former   Packs/day: 0.10   Years: 2.00   Total pack years: 0.20   Types: Cigarettes   Quit date: 12/25/1996   Years since quitting: 26.1   Passive exposure: Never  Smokeless Tobacco Never  Tobacco Comments   smoked 2-3 cigs daily x 2 years   Counseling given: Not Answered Tobacco comments: smoked 2-3 cigs daily x 2  years   Outpatient Medications Prior to Visit  Medication Sig Dispense Refill   Adalimumab (HUMIRA) 40 MG/0.8ML PSKT Inject 40 mg SQ every 2 weeks for rheumatoid arthritis 2 each 6   albuterol (VENTOLIN HFA) 108 (90 Base) MCG/ACT inhaler Inhale 2 puffs into the lungs every 6 (six) hours as needed for wheezing or shortness of breath. 18 g 2   amLODipine (NORVASC) 10 MG tablet Take 1 tablet (10 mg total) by mouth daily. 30 tablet 11   atorvastatin (LIPITOR) 20 MG tablet Take 1 tablet (20 mg total) by mouth daily. 30 tablet 11   buPROPion (WELLBUTRIN SR) 150 MG 12 hr tablet Take 1 tablet (150 mg total) by mouth 2 (two) times daily. 60 tablet 11   carvedilol (COREG) 12.5 MG tablet Take 1 tablet (12.5  mg total) by mouth 2 (two) times daily with a meal. 60 tablet 5   cetirizine (ZYRTEC) 10 MG tablet Take 1 tablet by mouth daily.     diclofenac (VOLTAREN) 75 MG EC tablet TAKE 1 TABLET BY MOUTH TWICE DAILY WITH MEALS 60 tablet 0   fluticasone (FLONASE) 50 MCG/ACT nasal spray Place into the nose.     losartan-hydrochlorothiazide (HYZAAR) 100-25 MG tablet Take 1 tablet by mouth daily.     Melatonin 10 MG CAPS Take 20 mg by mouth at bedtime as needed (sleep).     traMADol (ULTRAM) 50 MG tablet Take 1 tablet (50 mg total) by mouth every 8 (eight) hours as needed for moderate pain. 30 tablet 0   escitalopram (LEXAPRO) 20 MG tablet Take by mouth.     No facility-administered medications prior to visit.     Review of Systems:   Constitutional: No night sweats, fevers, chills, or lassitude. +excessive daytime fatigue, weight gain HEENT: No headaches, difficulty swallowing, tooth/dental problems, or sore throat. No sneezing, itching, ear ache. +nasal congestion CV:  No chest pain, orthopnea, PND, swelling in lower extremities, anasarca, dizziness, palpitations, syncope Resp: +snoring; shortness of breath with exertion (baseline); occasional cough (baseline). No excess mucus or change in color of mucus. No productive or non-productive. No hemoptysis. No wheezing.  No chest wall deformity GI:  +heartburn, indigestion. No abdominal pain, nausea, vomiting, diarrhea, change in bowel habits, loss of appetite, bloody stools.  GU: No dysuria, change in color of urine, urgency or frequency.  No flank pain, no hematuria  Skin: No rash, lesions, ulcerations MSK:  No joint pain or swelling.  +chronic back pain Neuro: No dizziness or lightheadedness.  Psych: +depression, anxiety; sleep disturbance. No SI/HI. Mood stable.     Physical Exam:  BP (!) 142/86   Pulse 78   Ht 5' 9"$  (1.753 m)   Wt (!) 334 lb 6.4 oz (151.7 kg)   SpO2 100%   BMI 49.38 kg/m   GEN: Pleasant, interactive, well-appearing; obese;  in no acute distress. HEENT:  Normocephalic and atraumatic. PERRLA. Sclera white. Nasal turbinates pink, moist and patent bilaterally. No rhinorrhea present. Oropharynx pink and moist, without exudate or edema. No lesions, ulcerations, or postnasal drip. Mallampati III NECK:  Supple w/ fair ROM. No JVD present. Normal carotid impulses w/o bruits. Thyroid symmetrical with no goiter or nodules palpated. No lymphadenopathy.   CV: RRR, no m/r/g, no peripheral edema. Pulses intact, +2 bilaterally. No cyanosis, pallor or clubbing. PULMONARY:  Unlabored, regular breathing. Clear bilaterally A&P w/o wheezes/rales/rhonchi. No accessory muscle use.  GI: BS present and normoactive. Soft, non-tender to palpation. No organomegaly or masses detected.  MSK: No erythema, warmth  or tenderness. Cap refil <2 sec all extrem. No deformities or joint swelling noted.  Neuro: A/Ox3. No focal deficits noted.   Skin: Warm, no lesions or rashe Psych: Normal affect and behavior. Judgement and thought content appropriate.     Lab Results:  CBC    Component Value Date/Time   WBC 11.5 (H) 01/10/2023 1450   RBC 4.96 01/10/2023 1450   HGB 13.8 01/10/2023 1450   HGB 13.5 01/30/2022 1637   HCT 41.6 01/10/2023 1450   HCT 42.7 01/30/2022 1637   PLT 327 01/10/2023 1450   PLT 312 01/30/2022 1637   MCV 83.9 01/10/2023 1450   MCV 85 01/30/2022 1637   MCH 27.8 01/10/2023 1450   MCHC 33.2 01/10/2023 1450   RDW 11.9 01/10/2023 1450   RDW 11.8 01/30/2022 1637   LYMPHSABS 5,118 (H) 01/10/2023 1450   LYMPHSABS 4.2 (H) 01/30/2022 1637   MONOABS 1.5 (H) 04/19/2022 1015   EOSABS 150 01/10/2023 1450   EOSABS 0.3 01/30/2022 1637   BASOSABS 46 01/10/2023 1450   BASOSABS 0.0 01/30/2022 1637    BMET    Component Value Date/Time   NA 143 01/10/2023 1450   NA 143 04/04/2022 1426   K 3.7 01/10/2023 1450   CL 105 01/10/2023 1450   CO2 27 01/10/2023 1450   GLUCOSE 87 01/10/2023 1450   BUN 13 01/10/2023 1450   BUN 15  04/04/2022 1426   CREATININE 0.81 01/10/2023 1450   CALCIUM 9.1 01/10/2023 1450   GFRNONAA >60 04/19/2022 1015   GFRAA  10/12/2009 0600    >60        The eGFR has been calculated using the MDRD equation. This calculation has not been validated in all clinical situations. eGFR's persistently <60 mL/min signify possible Chronic Kidney Disease.    BNP No results found for: "BNP"   Imaging:  MM 3D SCREEN BREAST BILATERAL  Result Date: 02/01/2023 CLINICAL DATA:  Screening. EXAM: DIGITAL SCREENING BILATERAL MAMMOGRAM WITH TOMOSYNTHESIS AND CAD TECHNIQUE: Bilateral screening digital craniocaudal and mediolateral oblique mammograms were obtained. Bilateral screening digital breast tomosynthesis was performed. The images were evaluated with computer-aided detection. COMPARISON:  Previous exam(s). ACR Breast Density Category a: The breast tissue is almost entirely fatty. FINDINGS: There are no findings suspicious for malignancy. IMPRESSION: No mammographic evidence of malignancy. A result letter of this screening mammogram will be mailed directly to the patient. RECOMMENDATION: Screening mammogram in one year. (Code:SM-B-01Y) BI-RADS CATEGORY  1: Negative. Electronically Signed   By: Valentino Saxon M.D.   On: 02/01/2023 08:35   [place] LIMITED JOINT SPACE STRUCTURES LOW BILAT  Result Date: 01/29/2023 Procedure: Real-time Ultrasound Guided injection of the left knee Device: Samsung [id number] Verbal informed consent obtained. Time-out conducted. Noted no overlying erythema, induration, or other signs of local infection. Skin prepped in a sterile fashion. Local anesthesia: Topical Ethyl chloride. With sterile technique and under real time ultrasound guidance: Mild effusion noted, 30 mg/2 mL of OrthoVisc (sodium hyaluronate) in a prefilled syringe was injected easily into the knee through a 22-gauge needle. Completed without difficulty Advised to call if fevers/chills, erythema, induration, drainage, or  persistent bleeding. Images permanently stored and available for review in PACS. Impression: Technically successful ultrasound guided injection.  Procedure: Real-time Ultrasound Guided injection of the right knee Device: Samsung [id number] Verbal informed consent obtained. Time-out conducted. Noted no overlying erythema, induration, or other signs of local infection. Skin prepped in a sterile fashion. Local anesthesia: Topical Ethyl chloride. With sterile technique and under real time ultrasound  guidance: Mild effusion noted, 30 mg/2 mL of OrthoVisc (sodium hyaluronate) in a prefilled syringe was injected easily into the knee through a 22-gauge needle. Completed without difficulty Advised to call if fevers/chills, erythema, induration, drainage, or persistent bleeding. Images permanently stored and available for review in PACS. Impression: Technically successful ultrasound guided injection.   [place] LIMITED JOINT SPACE STRUCTURES LOW BILAT  Result Date: 01/23/2023 Procedure: Real-time Ultrasound Guided injection of the left knee Device: Samsung [id number] Verbal informed consent obtained. Time-out conducted. Noted no overlying erythema, induration, or other signs of local infection. Skin prepped in a sterile fashion. Local anesthesia: Topical Ethyl chloride. With sterile technique and under real time ultrasound guidance: Mild effusion noted, 30 mg/2 mL of OrthoVisc (sodium hyaluronate) in a prefilled syringe was injected easily into the knee through a 22-gauge needle. Completed without difficulty Advised to call if fevers/chills, erythema, induration, drainage, or persistent bleeding. Images permanently stored and available for review in PACS. Impression: Technically successful ultrasound guided injection.  Procedure: Real-time Ultrasound Guided injection of the right knee Device: Samsung HS60 Verbal informed consent obtained. Time-out conducted. Noted no overlying erythema, induration, or other signs of local infection. Skin  prepped in a sterile fashion. Local anesthesia: Topical Ethyl chloride. With sterile technique and under real time ultrasound guidance: Mild effusion noted, 30 mg/2 mL of OrthoVisc (sodium hyaluronate) in a prefilled syringe was injected easily into the knee through a 22-gauge needle. Completed without difficulty Advised to call if fevers/chills, erythema, induration, drainage, or persistent bleeding. Images permanently stored and available for review in PACS. Impression: Technically successful ultrasound guided injection.    Hyaluronan (ORTHOVISC) intra-articular injection 30 mg     Date Action Dose Route User   01/02/2023 1024 Given 30 mg Intra-articular (Other) Tarri Glenn, CMA      Hyaluronan (ORTHOVISC) intra-articular injection 30 mg     Date Action Dose Route User   01/02/2023 1024 Given 30 mg Intra-articular (Other) Tarri Glenn, CMA      Hyaluronan (ORTHOVISC) intra-articular injection 30 mg     Date Action Dose Route User   01/16/2023 1527 Given 30 mg Intra-articular (Other) Tarri Glenn, CMA      Hyaluronan (ORTHOVISC) intra-articular injection 30 mg     Date Action Dose Route User   01/16/2023 1527 Given 30 mg Intra-articular (Other) Tarri Glenn, CMA      Hyaluronan (ORTHOVISC) intra-articular injection 30 mg     Date Action Dose Route User   01/23/2023 1523 Given 30 mg Intra-articular (Other) Tarri Glenn, CMA      Hyaluronan (ORTHOVISC) intra-articular injection 30 mg     Date Action Dose Route User   01/23/2023 1522 Given 30 mg Intra-articular (Other) Tarri Glenn, CMA      Hyaluronan (ORTHOVISC) intra-articular injection 30 mg     Date Action Dose Route User   01/30/2023 0945 Given 30 mg Intra-articular Tarri Glenn, CMA      Hyaluronan (ORTHOVISC) intra-articular injection 30 mg     Date Action Dose Route User   01/30/2023 0945 Given 30 mg Intra-articular Tarri Glenn, CMA           No data to display          No  results found for: "NITRICOXIDE"      Assessment & Plan:   OSA (obstructive sleep apnea) Initially diagnosed with mild OSA in 2010 and started on CPAP. She has been off therapy for a few years now. She would be agreeable to restarting CPAP, if appropriate. She has  snoring, excessive daytime sleepiness, restless sleep; AM dry mouth. BMI 49. History of poorly controlled HTN. Given this,  I am concerned she still has sleep disordered breathing with obstructive sleep apnea. She will need sleep study for further evaluation. With BMI 49, some concern for hypoventilation syndrome as well so in lab study ordered.    - discussed how weight can impact sleep and risk for sleep disordered breathing - discussed options to assist with weight loss: combination of diet modification, cardiovascular and strength training exercises   - had an extensive discussion regarding the adverse health consequences related to untreated sleep disordered breathing - specifically discussed the risks for hypertension, coronary artery disease, cardiac dysrhythmias, cerebrovascular disease, and diabetes - lifestyle modification discussed   - discussed how sleep disruption can increase risk of accidents, particularly when driving - safe driving practices were discussed  Patient Instructions  Given your symptoms and history, I am concerned that you still have sleep disordered breathing with sleep apnea. I have ordered a sleep study for further evaluation. Someone will contact you to schedule this  We discussed how untreated sleep apnea puts an individual at risk for cardiac arrhthymias, pulm HTN, DM, stroke and increases their risk for daytime accidents. We also briefly reviewed treatment options including weight loss, side sleeping position, oral appliance, CPAP therapy or referral to ENT for possible surgical options  Use caution when driving and pull over if you become sleepy  Follow up after your sleep study with Robin  Charniece Venturino,NP to go over results, or sooner, if needed   Class 3 drug-induced obesity with body mass index (BMI) of 45.0 to 49.9 in adult ([place]) BMI 49. Healthy weight loss encouraged. See above plan.  I spent 35 minutes of dedicated to the care of this patient on the date of this encounter to include pre-visit review of records, face-to-face time with the patient discussing conditions above, post visit ordering of testing, clinical documentation with the electronic health record, making appropriate referrals as documented, and communicating necessary findings to members of the patients care team.  Clayton Bibles, NP 02/15/2023  Pt aware and understands NP's role.

## 2023-02-15 NOTE — Assessment & Plan Note (Addendum)
BMI 49. Healthy weight loss encouraged. See above plan.

## 2023-02-15 NOTE — Patient Instructions (Addendum)
Given your symptoms and history, I am concerned that you still have sleep disordered breathing with sleep apnea. I have ordered a sleep study for further evaluation. Someone will contact you to schedule this  We discussed how untreated sleep apnea puts an individual at risk for cardiac arrhthymias, pulm HTN, DM, stroke and increases their risk for daytime accidents. We also briefly reviewed treatment options including weight loss, side sleeping position, oral appliance, CPAP therapy or referral to ENT for possible surgical options  Use caution when driving and pull over if you become sleepy  Follow up after your sleep study with Robin Jersey Shavell Nored,NP to go over results, or sooner, if needed

## 2023-02-15 NOTE — Assessment & Plan Note (Signed)
Initially diagnosed with mild OSA in 2010 and started on CPAP. She has been off therapy for a few years now. She would be agreeable to restarting CPAP, if appropriate. She has snoring, excessive daytime sleepiness, restless sleep; AM dry mouth. BMI 49. History of poorly controlled HTN. Given this,  I am concerned she still has sleep disordered breathing with obstructive sleep apnea. She will need sleep study for further evaluation. With BMI 49, some concern for hypoventilation syndrome as well so in lab study ordered.    - discussed how weight can impact sleep and risk for sleep disordered breathing - discussed options to assist with weight loss: combination of diet modification, cardiovascular and strength training exercises   - had an extensive discussion regarding the adverse health consequences related to untreated sleep disordered breathing - specifically discussed the risks for hypertension, coronary artery disease, cardiac dysrhythmias, cerebrovascular disease, and diabetes - lifestyle modification discussed   - discussed how sleep disruption can increase risk of accidents, particularly when driving - safe driving practices were discussed  Patient Instructions  Given your symptoms and history, I am concerned that you still have sleep disordered breathing with sleep apnea. I have ordered a sleep study for further evaluation. Someone will contact you to schedule this  We discussed how untreated sleep apnea puts an individual at risk for cardiac arrhthymias, pulm HTN, DM, stroke and increases their risk for daytime accidents. We also briefly reviewed treatment options including weight loss, side sleeping position, oral appliance, CPAP therapy or referral to ENT for possible surgical options  Use caution when driving and pull over if you become sleepy  Follow up after your sleep study with Robin Jersey Kaleab Frasier,NP to go over results, or sooner, if needed

## 2023-02-17 NOTE — Progress Notes (Unsigned)
Office Visit Note  Patient: Robin [name]             Date of Birth: Sep 18, 1971           MRN: [id number]             PCP: Jorge Ny, PA-C Referring: Jorge Ny, PA-C Visit Date: 02/19/2023   Subjective:  No chief complaint on file.   History of Present Illness: Robin [name] is a 52 y.o. female here for follow up ***   Previous HPI 01/10/23  Robin [name] is a 52 y.o. female here for follow up for seronegative RA on Humira 40 mg [place] q14days. Since our last visit she has a lot of ongoing joint pain her worst affected joints are in both knees where she has known severe osteoarthritis.  She is planned for completing series of visco supplementation injections. This is ongoing as a temporizing measure she is not currently candidate for joint replacement surgery due to morbid obesity.  She is seeing weight loss clinic for this.  Besides that she still having some ongoing joint pain and swelling in her upper extremities and increase in facial rashes.   Previous HPI 12/05/22 Robin [name] is a 51 y.o. female here for follow up for seronegative RA on Humira 40 mg [place] q14days. She had updated labs checked in family medicine clinic November with negative RA serology and normal inflammatory markers. Some improvement in swelling with Humira but continues to have a lot of pain on a daily basis, worst in knees with known OA also in both hands, right elbow, left shoulder worse. She had injections to her knees with only a few days of benefit. On discussion of lab findings and notes reviewed, patient reports previously thought to have sarcoid with abnormal CXR and eye inflammation problems but was not on disease specific treatment at that time. She also had abnormal labs concerning for lupus for years when younger does not recall the exact details.     Labs reviewed 10/26/22 ANA pos RF neg CCP neg ESR wnl CRP wnl Vit D 1,25 wnl   Previous HPI 04/18/22 Robin [name] is a 52 y.o. female here for seronegative RA, previously a patient with Dr. Kathlene November. She was taking Humira previously interrupted due to loss of coverage and leflunomide with questionable response in symptoms. She was diagnosed after visit in 11/2019 with multiple areas of joint pain and frequent swelling particularly in bilateral wrists.  Laboratory work-up at that time including rheumatoid factor, CCP, ACE, HLA-B27, sed rate, and CRP were all negative.  She was started on methotrexate and the leflunomide with lack of response in her symptoms.  She started on Humira for this and took it for a few months but was then off the medication for at least about a year due to change in insurance status after losing her job.  She restarted this since 2 months ago so far has not seen a very impressive change in her symptoms.  She is significantly limited by severe lower back pain with degenerative arthritis pursuing disability for this.  Also has advanced osteoarthritis of bilateral knees.  Shoulder pain limits overhead movement or worsened with pressure.  Gets chronic hand pain worse around the thumbs but especially on the radial side of her wrist with fairly chronic swelling currently worse than normal on the right side.  She has had local steroid joint injections in multiple areas but none in the past year these were  typically beneficial for at least weeks in the past.  For her osteoarthritis pain she takes diclofenac twice daily with a pretty good benefit has not experienced any side effects or problems taking this medicine.    DMARD Hx Humira - Current, loss of response? MTX and LEF - inadequate response   No Rheumatology ROS completed.   PMFS History:  Patient Active Problem List   Diagnosis Date Noted   Positive ANA (antinuclear antibody) 12/05/2022   Vitamin D deficiency 11/27/2022   High risk medication use 04/19/2022   Financial insecurity 02/13/2022   Anxiety and depression 01/30/2022    Hypercholesterolemia 01/30/2022   Primary hypertension 01/30/2022   Rheumatoid arthritis of multiple sites with negative rheumatoid factor ([place]) 01/30/2022   Osteoarthritis of left knee 06/01/2021   Lumbar spinal stenosis status post L2-L3 TLIF 05/05/2021   Degenerative scoliosis 04/18/2021   Sarcoidosis 05/10/2020   Pain of left hip joint 03/24/2020   Cervical spondylosis without myelopathy 07/16/2019   Neck pain 07/16/2019   Class 3 drug-induced obesity with body mass index (BMI) of 45.0 to 49.9 in adult ([place]) 10/18/2018   Primary osteoarthritis of both knees 10/18/2018   Hammertoe of right foot 07/02/2018   OSA (obstructive sleep apnea) 10/26/2014    Past Medical History:  Diagnosis Date   Allergy    Anxiety    Asthma    Depression    GERD (gastroesophageal reflux disease)    Hyperlipidemia    Hypertension    OSA (obstructive sleep apnea)    Pneumonia    Rheumatoid arthritis ([place])    Sarcoidosis     No family history on file. Past Surgical History:  Procedure Laterality Date   ABDOMINAL HYSTERECTOMY     DEBRIDEMENT AND CLOSURE WOUND N/A 06/15/2021   Procedure: Irrigation and debridement and closure of spine wound;  Surgeon: Melina Schools, MD;  Location: [facility];  Service: Orthopedics;  Laterality: N/A;   ESOPHAGOGASTRODUODENOSCOPY     FOOT SURGERY Bilateral    Both surgeries for bunions by Dr. Janus Molder per pt   TRANSFORAMINAL LUMBAR INTERBODY FUSION (TLIF) WITH PEDICLE SCREW FIXATION 1 LEVEL N/A 05/05/2021   Procedure: TRANSFORAMINAL LUMBAR INTERBODY FUSION (TLIF) WITH PEDICLE SCREW FIXATION 1 LEVEL L2-3;  Surgeon: Melina Schools, MD;  Location: [facility];  Service: Orthopedics;  Laterality: N/A;  4 hrs   Social History   Social History Narrative   Not on file   Immunization History  Administered Date(s) Administered   Influenza,inj,quad, With Preservative 09/25/2019   Influenza-Unspecified 10/20/2017   MODERNA COVID-19 SARS-COV-2 PEDS BIVALENT BOOSTER 6Y-11Y  01/07/2020, 02/04/2020   Unspecified SARS-COV-2 Vaccination 01/07/2020, 02/04/2020     Objective: Vital Signs: There were no vitals taken for this visit.   Physical Exam   Musculoskeletal Exam: ***  CDAI Exam: CDAI Score: -- Patient Global: --; Provider Global: -- Swollen: --; Tender: -- Joint Exam 02/19/2023   No joint exam has been documented for this visit   There is currently no information documented on the homunculus. Go to the Rheumatology activity and complete the homunculus joint exam.  Investigation: No additional findings.  Imaging: MM 3D SCREEN BREAST BILATERAL  Result Date: 02/01/2023 CLINICAL DATA:  Screening. EXAM: DIGITAL SCREENING BILATERAL MAMMOGRAM WITH TOMOSYNTHESIS AND CAD TECHNIQUE: Bilateral screening digital craniocaudal and mediolateral oblique mammograms were obtained. Bilateral screening digital breast tomosynthesis was performed. The images were evaluated with computer-aided detection. COMPARISON:  Previous exam(s). ACR Breast Density Category a: The breast tissue is almost entirely fatty. FINDINGS: There are no  findings suspicious for malignancy. IMPRESSION: No mammographic evidence of malignancy. A result letter of this screening mammogram will be mailed directly to the patient. RECOMMENDATION: Screening mammogram in one year. (Code:SM-B-01Y) BI-RADS CATEGORY  1: Negative. Electronically Signed   By: Valentino Saxon M.D.   On: 02/01/2023 08:35   [place] LIMITED JOINT SPACE STRUCTURES LOW BILAT  Result Date: 01/29/2023 Procedure: Real-time Ultrasound Guided injection of the left knee Device: Samsung HS60 Verbal informed consent obtained. Time-out conducted. Noted no overlying erythema, induration, or other signs of local infection. Skin prepped in a sterile fashion. Local anesthesia: Topical Ethyl chloride. With sterile technique and under real time ultrasound guidance: Mild effusion noted, 30 mg/2 mL of OrthoVisc (sodium hyaluronate) in a prefilled syringe was  injected easily into the knee through a 22-gauge needle. Completed without difficulty Advised to call if fevers/chills, erythema, induration, drainage, or persistent bleeding. Images permanently stored and available for review in PACS. Impression: Technically successful ultrasound guided injection.  Procedure: Real-time Ultrasound Guided injection of the right knee Device: Samsung HS60 Verbal informed consent obtained. Time-out conducted. Noted no overlying erythema, induration, or other signs of local infection. Skin prepped in a sterile fashion. Local anesthesia: Topical Ethyl chloride. With sterile technique and under real time ultrasound guidance: Mild effusion noted, 30 mg/2 mL of OrthoVisc (sodium hyaluronate) in a prefilled syringe was injected easily into the knee through a 22-gauge needle. Completed without difficulty Advised to call if fevers/chills, erythema, induration, drainage, or persistent bleeding. Images permanently stored and available for review in PACS. Impression: Technically successful ultrasound guided injection.   [place] LIMITED JOINT SPACE STRUCTURES LOW BILAT  Result Date: 01/23/2023 Procedure: Real-time Ultrasound Guided injection of the left knee Device: Samsung [id number] Verbal informed consent obtained. Time-out conducted. Noted no overlying erythema, induration, or other signs of local infection. Skin prepped in a sterile fashion. Local anesthesia: Topical Ethyl chloride. With sterile technique and under real time ultrasound guidance: Mild effusion noted, 30 mg/2 mL of OrthoVisc (sodium hyaluronate) in a prefilled syringe was injected easily into the knee through a 22-gauge needle. Completed without difficulty Advised to call if fevers/chills, erythema, induration, drainage, or persistent bleeding. Images permanently stored and available for review in PACS. Impression: Technically successful ultrasound guided injection.  Procedure: Real-time Ultrasound Guided injection of the right knee  Device: Samsung HS60 Verbal informed consent obtained. Time-out conducted. Noted no overlying erythema, induration, or other signs of local infection. Skin prepped in a sterile fashion. Local anesthesia: Topical Ethyl chloride. With sterile technique and under real time ultrasound guidance: Mild effusion noted, 30 mg/2 mL of OrthoVisc (sodium hyaluronate) in a prefilled syringe was injected easily into the knee through a 22-gauge needle. Completed without difficulty Advised to call if fevers/chills, erythema, induration, drainage, or persistent bleeding. Images permanently stored and available for review in PACS. Impression: Technically successful ultrasound guided injection.    Recent Labs: Lab Results  Component Value Date   WBC 11.5 (H) 01/10/2023   HGB 13.8 01/10/2023   PLT 327 01/10/2023   NA 143 01/10/2023   K 3.7 01/10/2023   CL 105 01/10/2023   CO2 27 01/10/2023   GLUCOSE 87 01/10/2023   BUN 13 01/10/2023   CREATININE 0.81 01/10/2023   BILITOT 0.5 01/10/2023   ALKPHOS 96 04/19/2022   AST 19 01/10/2023   ALT 19 01/10/2023   PROT 7.1 01/10/2023   ALBUMIN 3.5 04/19/2022   CALCIUM 9.1 01/10/2023   GFRAA  10/12/2009    >60  The eGFR has been calculated using the MDRD equation. This calculation has not been validated in all clinical situations. eGFR's persistently <60 mL/min signify possible Chronic Kidney Disease.   QFTBGOLDPLUS Negative 04/19/2022    Speciality Comments: No specialty comments available.  Procedures:  No procedures performed Allergies: Lisinopril   Assessment / Plan:     Visit Diagnoses: No diagnosis found.  ***  Orders: No orders of the defined types were placed in this encounter.  No orders of the defined types were placed in this encounter.    Follow-Up Instructions: No follow-ups on file.   Collier Salina, MD  Note - This record has been created using [employer].  Chart creation errors have been sought, but may not  always  have been located. Such creation errors do not reflect on  the standard of medical care.

## 2023-02-19 ENCOUNTER — Ambulatory Visit: Payer: Medicaid Other | Attending: Internal Medicine | Admitting: Internal Medicine

## 2023-02-19 ENCOUNTER — Encounter: Payer: Self-pay | Admitting: Internal Medicine

## 2023-02-19 VITALS — BP 142/90 | HR 77 | Resp 16 | Ht 69.0 in | Wt 331.0 lb

## 2023-02-19 DIAGNOSIS — R768 Other specified abnormal immunological findings in serum: Secondary | ICD-10-CM | POA: Diagnosis not present

## 2023-02-19 DIAGNOSIS — D869 Sarcoidosis, unspecified: Secondary | ICD-10-CM | POA: Diagnosis not present

## 2023-02-19 DIAGNOSIS — M0609 Rheumatoid arthritis without rheumatoid factor, multiple sites: Secondary | ICD-10-CM | POA: Diagnosis not present

## 2023-02-19 DIAGNOSIS — Z79899 Other long term (current) drug therapy: Secondary | ICD-10-CM

## 2023-02-19 NOTE — Patient Instructions (Signed)
Abatacept Injection What is this medication? ABATACEPT (a ba TA sept) treats autoimmune conditions, such as arthritis. It may also be used to treat a condition that can occur after a stem cell or bone marrow transplant (chronic graft versus host disease). It works by slowing down an overactive immune system. This medicine may be used for other purposes; ask your health care provider or pharmacist if you have questions. COMMON BRAND NAME(S): Minna Antis ClickJect What should I tell my care team before I take this medication? They need to know if you have any of these conditions: Cancer Diabetes Having surgery Hepatitis B or history of hepatitis B infection Immune system problems Infection, especially a viral infection, such as chickenpox, cold sores, herpes Lung or breathing problems, such as asthma or COPD Recent or upcoming vaccine Tuberculosis, a positive skin test for tuberculosis, or have recently been in close contact with someone who has tuberculosis An unusual or allergic reaction to abatacept, other medications, foods, dyes, or preservatives Pregnant or trying to get pregnant Breastfeeding How should I use this medication? This medication is infused into a vein or injected under the skin. Infusions are given by your care team in a hospital or clinic setting. It may be injected under the skin at home. If you get this medication at home, you will be taught how to prepare and give it. Use exactly as directed. Take it as directed on the prescription label. Keep taking it unless your care team tells you to stop. It is important that you put your used needles and syringes in a special sharps container. Do not put them in a trash can. If you do not have a sharps container, call your pharmacist or care team to get one. Talk to your care team about the use of this medication in children. While it may be prescribed for children as young as 2 years for selected conditions, precautions do  apply. Overdosage: If you think you have taken too much of this medicine contact a poison control center or emergency room at once. NOTE: This medicine is only for you. Do not share this medicine with others. What if I miss a dose? If you miss a dose, take it as soon as you can. If it is almost time for your next dose, take only that dose. Do not take double or extra doses. What may interact with this medication? Do not take this medication with any of the following: Live virus vaccines This medication may also interact with the following: Anakinra Baricitinib Canakinumab Medications that lower your chance of fighting an infection Rituximab TNF blockers, such as adalimumab, certolizumab, etanercept, golimumab, infliximab Tocilizumab Tofacitinib Upadacitinib Ustekinumab This list may not describe all possible interactions. Give your health care provider a list of all the medicines, herbs, non-prescription drugs, or dietary supplements you use. Also tell them if you smoke, drink alcohol, or use illegal drugs. Some items may interact with your medicine. What should I watch for while using this medication? Visit your care team for regular checks on your progress. Tell your care team if your symptoms do not start to get better or if they get worse. You will be tested for tuberculosis (TB) before you start this medication. If your care team prescribed any medication for TB, you should start taking the TB medication before starting this medication. Make sure to finish the full course of TB medication. This medication may increase your risk of getting an infection. Call your care team if you get fever,  chills, or sore throat, or other symptoms of a cold or flu. Do not treat yourself. Try to avoid being around people who are sick. If you have diabetes and are getting this medication as an infusion, it may give false high blood sugar readings on the day of your dose. This may happen if you use certain  types of blood glucose tests. Your care team may tell you to use a different way to monitor your blood sugar levels. What side effects may I notice from receiving this medication? Side effects that you should report to your care team as soon as possible: Allergic reactions--skin rash, itching, hives, swelling of the face, lips, tongue, or throat Infection--fever, chills, cough, sore throat, wounds that don't heal, pain or trouble when passing urine, general feeling of discomfort or being unwell Side effects that usually do not require medical attention (report to your care team if they continue or are bothersome): Back pain Cough Dizziness Headache Runny or stuffy nose Sore throat This list may not describe all possible side effects. Call your doctor for medical advice about side effects. You may report side effects to FDA at 1-800-FDA-1088. Where should I keep my medication? If you take this medication at home, keep out of the reach of children and pets. Store in the refrigerator. Keep this medication in the original container. Protect from light. Do not freeze. Do not shake. Get rid of any unused medication after the expiration date. To get rid of medications that are no longer needed or have expired: Take the medication to a medication take-back program. Check with your pharmacy or law enforcement to find a location. If you cannot return the medication, ask your pharmacist or care team how to get rid of the medication safely. NOTE: This sheet is a summary. It may not cover all possible information. If you have questions about this medicine, talk to your doctor, pharmacist, or health care provider.  2023 Elsevier/Gold Standard (2022-05-09 00:00:00)

## 2023-02-20 ENCOUNTER — Telehealth: Payer: Self-pay | Admitting: Pharmacist

## 2023-02-20 DIAGNOSIS — M0609 Rheumatoid arthritis without rheumatoid factor, multiple sites: Secondary | ICD-10-CM

## 2023-02-20 DIAGNOSIS — Z79899 Other long term (current) drug therapy: Secondary | ICD-10-CM

## 2023-02-20 NOTE — Telephone Encounter (Addendum)
Submitted a Prior Authorization request to [facility] for [facility] via CoverMyMeds. Will update once we receive a response.  Key: [id number]  Patient may be denied as I was unable to find Hepatitis B Core Antibody in results in Epic or CareEverywhere.  Knox Saliva, PharmD, MPH, BCPS, CPP Clinical Pharmacist (Rheumatology and Pulmonology)  ----- Message from Collier Salina, MD sent at 02/19/2023  9:14 AM EST ----- Regarding: Orencia After meeting and going over side effect concerns, Ms. Bloch seems more amenable to trying orencia with [place] weekly dosing. So let's look into that instead of the actemra. But I also gave her information to review on this one and let us know if new concerns.

## 2023-02-21 ENCOUNTER — Other Ambulatory Visit (HOSPITAL_COMMUNITY): Payer: Self-pay

## 2023-02-21 ENCOUNTER — Other Ambulatory Visit: Payer: Self-pay

## 2023-02-21 MED ORDER — ORENCIA CLICKJECT 125 MG/ML ~~LOC~~ SOAJ
125.0000 mg | SUBCUTANEOUS | 0 refills | Status: DC
  Filled 2023-02-21: qty 4, fill #0
  Filled 2023-02-27: qty 4, 28d supply, fill #0

## 2023-02-21 NOTE — Telephone Encounter (Signed)
Received notification from [facility] regarding a prior authorization for [facility]. Authorization has been APPROVED from 02/20/2023 to 02/21/2024. Approval letter sent to scan center.  Per test claim, copay for 28 days supply is $3  Patient can fill through [facility]: [phone]   Authorization # [id number]  Called patient about Orencia approval. Rx sent to [facility] to be couriered to clinic by new start visit on 02/28/2023  Knox Saliva, PharmD, MPH, BCPS, CPP Clinical Pharmacist (Rheumatology and Pulmonology)

## 2023-02-27 ENCOUNTER — Other Ambulatory Visit (HOSPITAL_COMMUNITY): Payer: Self-pay

## 2023-02-27 ENCOUNTER — Other Ambulatory Visit: Payer: Self-pay | Admitting: Internal Medicine

## 2023-02-27 ENCOUNTER — Other Ambulatory Visit: Payer: Self-pay

## 2023-02-27 NOTE — Telephone Encounter (Signed)
Delivery instructions have been updated in [facility], medication will be  picked up and delivered  to [facility] by me personally in order to ensure delivery prior to pt's 10:30am appointment.  Rx has been processed in [facility] and there is a copay of $3.00.

## 2023-02-28 ENCOUNTER — Other Ambulatory Visit: Payer: Self-pay

## 2023-02-28 ENCOUNTER — Ambulatory Visit: Payer: Medicaid Other | Attending: Internal Medicine | Admitting: Pharmacist

## 2023-02-28 ENCOUNTER — Other Ambulatory Visit (HOSPITAL_COMMUNITY): Payer: Self-pay

## 2023-02-28 DIAGNOSIS — Z7189 Other specified counseling: Secondary | ICD-10-CM

## 2023-02-28 DIAGNOSIS — Z79899 Other long term (current) drug therapy: Secondary | ICD-10-CM

## 2023-02-28 DIAGNOSIS — M0609 Rheumatoid arthritis without rheumatoid factor, multiple sites: Secondary | ICD-10-CM

## 2023-02-28 MED ORDER — ORENCIA CLICKJECT 125 MG/ML ~~LOC~~ SOAJ
125.0000 mg | SUBCUTANEOUS | 1 refills | Status: DC
  Filled 2023-02-28 – 2023-03-23 (×2): qty 4, 28d supply, fill #0
  Filled 2023-04-17: qty 4, 28d supply, fill #1

## 2023-02-28 NOTE — Patient Instructions (Signed)
Your next ORENCIA dose is due on 03/07/23, 03/14/23, and every 7 days thereafter  HOLD ORENCIA if you have signs or symptoms of an infection. You can resume once you feel better or back to your baseline. HOLD ORENCIA if you start antibiotics to treat an infection. HOLD ORENCIA around the time of surgery/procedures. Your surgeon will be able to provide recommendations on when to hold BEFORE and when you are cleared to [place].  Pharmacy information: Your prescription will be shipped from [facility]. Their phone number is [phone]. They will call to schedule shipment and confirm address. They will mail your medication to your home.  Labs are due in 1 month then every 3 months. Lab hours are from Monday to Thursday 8am-12:30pm and 1pm-5pm and Friday 8am-12pm. You do not need an appointment if you come for labs during these times.  How to manage an injection site reaction: Remember the 5 C's: COUNTER - leave on the counter at least 30 minutes but up to overnight to bring medication to room temperature. This may help prevent stinging COLD - place something cold (like an ice gel pack or cold water bottle) on the injection site just before cleansing with alcohol. This may help reduce pain CLARITIN - use Claritin (generic name is loratadine) for the first two weeks of treatment or the day of, the day before, and the day after injecting. This will help to minimize injection site reactions CORTISONE CREAM - apply if injection site is irritated and itching CALL ME - if injection site reaction is bigger than the size of your fist, looks infected, blisters, or if you develop hives

## 2023-02-28 NOTE — Progress Notes (Signed)
Pharmacy Note  Subjective:   Patient presents to clinic today to receive first dose of Orencia for rheumatoid arthritis. Patient has been off of treatment. Was on Humira previously with waning clinical response. Plan was to start Actemra but she read side effects after appointment and did not want to start. She reviewed Orencia with Dr. Benjamine Mola and felt comfortable starting this.  Patient running a fever or have signs/symptoms of infection? No  Patient currently on antibiotics for the treatment of infection? No  Patient have any upcoming invasive procedures/surgeries? No July weight loss surgery  Objective: CMP     Component Value Date/Time   NA 143 01/10/2023 1450   NA 143 04/04/2022 1426   K 3.7 01/10/2023 1450   CL 105 01/10/2023 1450   CO2 27 01/10/2023 1450   GLUCOSE 87 01/10/2023 1450   BUN 13 01/10/2023 1450   BUN 15 04/04/2022 1426   CREATININE 0.81 01/10/2023 1450   CALCIUM 9.1 01/10/2023 1450   PROT 7.1 01/10/2023 1450   PROT 6.9 01/30/2022 1637   ALBUMIN 3.5 04/19/2022 1015   ALBUMIN 4.1 01/30/2022 1637   AST 19 01/10/2023 1450   ALT 19 01/10/2023 1450   ALKPHOS 96 04/19/2022 1015   BILITOT 0.5 01/10/2023 1450   BILITOT 0.2 01/30/2022 1637   GFRNONAA >60 04/19/2022 1015   GFRAA  10/12/2009 0600    >60        The eGFR has been calculated using the MDRD equation. This calculation has not been validated in all clinical situations. eGFR's persistently <60 mL/min signify possible Chronic Kidney Disease.    CBC    Component Value Date/Time   WBC 11.5 (H) 01/10/2023 1450   RBC 4.96 01/10/2023 1450   HGB 13.8 01/10/2023 1450   HGB 13.5 01/30/2022 1637   HCT 41.6 01/10/2023 1450   HCT 42.7 01/30/2022 1637   PLT 327 01/10/2023 1450   PLT 312 01/30/2022 1637   MCV 83.9 01/10/2023 1450   MCV 85 01/30/2022 1637   MCH 27.8 01/10/2023 1450   MCHC 33.2 01/10/2023 1450   RDW 11.9 01/10/2023 1450   RDW 11.8 01/30/2022 1637   LYMPHSABS 5,118 (H) 01/10/2023 1450    LYMPHSABS 4.2 (H) 01/30/2022 1637   MONOABS 1.5 (H) 04/19/2022 1015   EOSABS 150 01/10/2023 1450   EOSABS 0.3 01/30/2022 1637   BASOSABS 46 01/10/2023 1450   BASOSABS 0.0 01/30/2022 1637    Baseline Immunosuppressant Therapy Labs TB GOLD    Latest Ref Rng & Units 04/19/2022   10:15 AM  Quantiferon TB Gold  Quantiferon TB Gold Plus Negative Negative    Hepatitis Panel    Latest Ref Rng & Units 10/12/2009    6:00 AM  Hepatitis  Hep B Surface Ag NEGATIVE NEGATIVE   Hep C Ab NEGATIVE NEGATIVE    HIV Lab Results  Component Value Date   HIV NON REACTIVE 10/12/2009   Immunoglobulins   SPEP    Latest Ref Rng & Units 01/10/2023    2:50 PM  Serum Protein Electrophoresis  Total Protein 6.1 - 8.1 g/dL 7.1    Chest x-ray: 12/02/20 - No active cardiopulmonary disease.  Assessment/Plan:  Reviewed importance of holding ORENCIA with signs/symptoms of an infections, if antibiotics are prescribed to treat an active infection, and with invasive procedures  Demonstrated proper injection technique with [facility] demo device  Patient able to demonstrate proper injection technique using the teach back method.  Patient self injected in the right upper thigh with:  WLOP-suppled  Medication: ORENCIA CLICKJECT '125mg'$ /mL pen NDC: [id number]: [id number] Expiration: 07/2024  Patient tolerated well.  Observed for 30 mins in office for adverse reaction and none noted.   Patient is to return in 1 month for labs and 6-8 weeks for follow-up appointment.  Standing orders placed.   ORENCIA approved through insurance .   Rx sent to: [facility]: [phone] . Patient sent home with 3 doses of Orencia that were shipped from [facility]. Patient provided with pharmacy phone number and advised that pharmacy will call her in 2-3 weeks to schedule refill to home  Patient will continue ORENCIA '125mg'$  subcut every 7 days as monotherapy  All questions encouraged and answered.   Instructed patient to call with any further questions or concerns.  Knox Saliva, PharmD, MPH, BCPS, CPP Clinical Pharmacist (Rheumatology and Pulmonology)  02/28/2023 8:19 AM

## 2023-03-06 ENCOUNTER — Encounter: Payer: Self-pay | Admitting: Gastroenterology

## 2023-03-12 ENCOUNTER — Ambulatory Visit (INDEPENDENT_AMBULATORY_CARE_PROVIDER_SITE_OTHER): Payer: Medicaid Other | Admitting: Sports Medicine

## 2023-03-12 DIAGNOSIS — Z981 Arthrodesis status: Secondary | ICD-10-CM

## 2023-03-12 DIAGNOSIS — M170 Bilateral primary osteoarthritis of knee: Secondary | ICD-10-CM | POA: Diagnosis not present

## 2023-03-12 DIAGNOSIS — G5603 Carpal tunnel syndrome, bilateral upper limbs: Secondary | ICD-10-CM

## 2023-03-12 MED ORDER — TRAMADOL HCL 50 MG PO TABS: 50.0000 mg | ORAL_TABLET | Freq: Three times a day (TID) | ORAL | 0 refills | Status: DC | PRN

## 2023-03-12 NOTE — Progress Notes (Addendum)
    Procedures performed today:    None.  Independent interpretation of notes and tests performed by another provider:   None.  Brief History, Exam, Impression, and Recommendations:    Primary osteoarthritis of both knees Unfortunately did not respond to a series of Orthovisc, too heavy for knee replacement. We will continue tramadol 3 times daily for now.  Bilateral carpal tunnel syndrome Bilateral nocturnal and morning hand numbness and tingling from the forearm down, she has carpal tunnel splints but is not wearing them, she will do this consistently for 6 weeks, she will do the carpal tunnel stretches and return to see me, she does not desire any injections.  Lumbar spinal stenosis status post L2-L3 TLIF This pleasant 52 year old female has known lumbar spinal stenosis status post L2-L3 transforaminal lumbar interbody fusion with Dr. Shon Baton, residual spinal stenosis L3-L4 and L4-L5, she had an L3-L4 interlaminar epidural that did not provide relief. She continues to have radicular symptoms down the left leg, she would let me know exactly which toes she feels it and this will help me to determine which nerve root to target next.  Update: Patient called back and let me know that she feels numbness and discomfort to her middle toes as well as her little toe on the outside of the left foot. For this reason we will proceed with left L5-S1 and left selective S1 epidurals.  She would also like consultation with Dr. Noel Gerold with [facility], I am happy to place this referral as well.    ____________________________________________ Ihor Austin. Benjamin Stain, M.D., ABFM., CAQSM., AME. Primary Care and Sports Medicine [facility]  Adjunct Professor of Family Medicine  [employer]  Restaurant manager, fast food

## 2023-03-12 NOTE — Assessment & Plan Note (Signed)
This pleasant 52 year old female has known lumbar spinal stenosis status post L2-L3 transforaminal lumbar interbody fusion with Dr. Rolena Infante, residual spinal stenosis L3-L4 and L4-L5, she had an L3-L4 interlaminar epidural that did not provide relief. She continues to have radicular symptoms down the left leg, she would let me know exactly which toes she feels it and this will help me to determine which nerve root to target next.  Update: Patient called back and let me know that she feels numbness and discomfort to her middle toes as well as her little toe on the outside of the left foot. For this reason we will proceed with left L5-S1 and left selective S1 epidurals.  She would also like consultation with Dr. Patrice Paradise with [facility], I am happy to place this referral as well.

## 2023-03-12 NOTE — Assessment & Plan Note (Signed)
Bilateral nocturnal and morning hand numbness and tingling from the forearm down, she has carpal tunnel splints but is not wearing them, she will do this consistently for 6 weeks, she will do the carpal tunnel stretches and return to see me, she does not desire any injections.

## 2023-03-12 NOTE — Assessment & Plan Note (Signed)
Unfortunately did not respond to a series of Orthovisc, too heavy for knee replacement. We will continue tramadol 3 times daily for now.

## 2023-03-13 ENCOUNTER — Encounter: Payer: Self-pay | Admitting: Sports Medicine

## 2023-03-13 NOTE — Addendum Note (Signed)
Addended by: Silverio Decamp on: 03/13/2023 12:10 PM   Modules accepted: Orders

## 2023-03-15 ENCOUNTER — Ambulatory Visit: Payer: Medicaid Other | Admitting: Internal Medicine

## 2023-03-20 ENCOUNTER — Other Ambulatory Visit (HOSPITAL_COMMUNITY): Payer: Self-pay

## 2023-03-23 ENCOUNTER — Other Ambulatory Visit: Payer: Self-pay

## 2023-03-23 ENCOUNTER — Other Ambulatory Visit (HOSPITAL_COMMUNITY): Payer: Self-pay

## 2023-03-25 ENCOUNTER — Encounter: Payer: Self-pay | Admitting: Sports Medicine

## 2023-03-26 ENCOUNTER — Encounter: Payer: Self-pay | Admitting: Sports Medicine

## 2023-03-26 ENCOUNTER — Telehealth: Payer: Self-pay

## 2023-03-26 NOTE — Telephone Encounter (Signed)
Initiated Prior authorization [id number] HCl 50MG  tablets Via: Covermymeds Case/Key:[id number] Status: Pending as of 03/26/23 Reason: Notified Pt via: Mychart

## 2023-03-27 ENCOUNTER — Ambulatory Visit
Admission: RE | Admit: 2023-03-27 | Discharge: 2023-03-27 | Disposition: A | Discharge status: Home / Self Care | Payer: Medicaid Other | Source: Ambulatory Visit | Attending: Sports Medicine | Admitting: Sports Medicine

## 2023-03-27 ENCOUNTER — Other Ambulatory Visit: Payer: Self-pay | Admitting: Sports Medicine

## 2023-03-27 DIAGNOSIS — Z981 Arthrodesis status: Secondary | ICD-10-CM

## 2023-03-27 MED ORDER — METHYLPREDNISOLONE ACETATE 40 MG/ML INJ SUSP (RADIOLOG
80.0000 mg | Freq: Once | INTRAMUSCULAR | Status: AC
  Administered 2023-03-27: 80 mg via EPIDURAL

## 2023-03-27 MED ORDER — IOPAMIDOL (ISOVUE-M 200) INJECTION 41%
1.0000 mL | Freq: Once | INTRAMUSCULAR | Status: AC
  Administered 2023-03-27: 1 mL via EPIDURAL

## 2023-03-27 NOTE — Discharge Instructions (Signed)

## 2023-04-05 ENCOUNTER — Other Ambulatory Visit: Payer: Self-pay | Admitting: *Deleted

## 2023-04-05 DIAGNOSIS — M0609 Rheumatoid arthritis without rheumatoid factor, multiple sites: Secondary | ICD-10-CM

## 2023-04-05 DIAGNOSIS — Z111 Encounter for screening for respiratory tuberculosis: Secondary | ICD-10-CM

## 2023-04-05 DIAGNOSIS — Z79899 Other long term (current) drug therapy: Secondary | ICD-10-CM

## 2023-04-05 DIAGNOSIS — Z9225 Personal history of immunosupression therapy: Secondary | ICD-10-CM

## 2023-04-07 LAB — CBC WITH DIFFERENTIAL/PLATELET
Absolute Monocytes: 1050 cells/uL — ABNORMAL HIGH (ref 200–950)
Basophils Absolute: 50 cells/uL (ref 0–200)
Basophils Relative: 0.4 %
Eosinophils Absolute: 150 cells/uL (ref 15–500)
Eosinophils Relative: 1.2 %
HCT: 40.5 % (ref 35.0–45.0)
Hemoglobin: 13.1 g/dL (ref 11.7–15.5)
Lymphs Abs: 6000 cells/uL — ABNORMAL HIGH (ref 850–3900)
MCH: 27.5 pg (ref 27.0–33.0)
MCHC: 32.3 g/dL (ref 32.0–36.0)
MCV: 84.9 fL (ref 80.0–100.0)
MPV: 10.3 fL (ref 7.5–12.5)
Monocytes Relative: 8.4 %
Neutro Abs: 5250 cells/uL (ref 1500–7800)
Neutrophils Relative %: 42 %
Platelets: 342 10*3/uL (ref 140–400)
RBC: 4.77 10*6/uL (ref 3.80–5.10)
RDW: 11.9 % (ref 11.0–15.0)
Total Lymphocyte: 48 %
WBC: 12.5 10*3/uL — ABNORMAL HIGH (ref 3.8–10.8)

## 2023-04-07 LAB — COMPLETE METABOLIC PANEL WITH GFR
AG Ratio: 1.2 (calc) (ref 1.0–2.5)
ALT: 17 U/L (ref 6–29)
AST: 16 U/L (ref 10–35)
Albumin: 3.7 g/dL (ref 3.6–5.1)
Alkaline phosphatase (APISO): 94 U/L (ref 37–153)
BUN: 15 mg/dL (ref 7–25)
CO2: 29 mmol/L (ref 20–32)
Calcium: 9.4 mg/dL (ref 8.6–10.4)
Chloride: 104 mmol/L (ref 98–110)
Creat: 0.98 mg/dL (ref 0.50–1.03)
Globulin: 3.1 g/dL (calc) (ref 1.9–3.7)
Glucose, Bld: 88 mg/dL (ref 65–99)
Potassium: 4.1 mmol/L (ref 3.5–5.3)
Sodium: 139 mmol/L (ref 135–146)
Total Bilirubin: 0.4 mg/dL (ref 0.2–1.2)
Total Protein: 6.8 g/dL (ref 6.1–8.1)
eGFR: 69 mL/min/{1.73_m2} (ref >=60)

## 2023-04-07 LAB — QUANTIFERON-TB GOLD PLUS
Mitogen-NIL: 8.03 IU/mL
NIL: 0.06 IU/mL
QuantiFERON-TB Gold Plus: NEGATIVE
TB1-NIL: 0.11 IU/mL
TB2-NIL: 0.05 IU/mL

## 2023-04-09 NOTE — Progress Notes (Signed)
Lab results look fine for the orencia. her white blood cell count is mildly elevated but this has been the case on her previous results as well. Metabolic panel is normal.

## 2023-04-10 ENCOUNTER — Ambulatory Visit (INDEPENDENT_AMBULATORY_CARE_PROVIDER_SITE_OTHER): Payer: Medicaid Other | Admitting: Orthopaedic Surgery

## 2023-04-10 ENCOUNTER — Encounter: Payer: Self-pay | Admitting: Orthopaedic Surgery

## 2023-04-10 VITALS — BP 125/78 | HR 72 | Ht 69.0 in | Wt 327.8 lb

## 2023-04-10 DIAGNOSIS — M170 Bilateral primary osteoarthritis of knee: Secondary | ICD-10-CM

## 2023-04-10 NOTE — Progress Notes (Signed)
Office Visit Note   Patient: Robin [name]           Date of Birth: 1970-12-31           MRN: [id number] Visit Date: 04/10/2023              Requested by: Quita Skye, PA-C [address] [place],  [place] [address] PCP: Quita Skye, PA-C   Assessment & Plan: Visit Diagnoses:  1. Primary osteoarthritis of both knees     Plan: Patient with bilateral knee bone-on-bone changes severe osteoarthritis failed conservative treatment and also injections.  I will recheck her again in 6 months.  Current BMI discussed weight loss discussed.  She is very happy that she has been accepted into the bariatric gastric sleeve program at [facility].  Recheck 6 months.  Follow-Up Instructions: Return in about 6 months (around 10/10/2023).   Orders:  No orders of the defined types were placed in this encounter.  No orders of the defined types were placed in this encounter.     Procedures: No procedures performed   Clinical Data: No additional findings.   Subjective: Chief Complaint  Patient presents with   Right Knee - Pain, Follow-up   Left Knee - Pain, Follow-up    HPI 52 year old female with BMI of 48 returns with bilateral knee pain.  Previous cortisone injections, Orthovisc injections by Dr. Benjamin Stain have not given her any relief.  More pain in the right knee than left knee.  She has been taking some tramadol last few days.  She was referred for bariatric treatment and there was significant length of delay before she could be treated in [place] she was referred to [facility] and she states she is being scheduled for gastric sleeve procedure to help with her weight loss.  Goal is to get her BMI down below 40 so that she can proceed with total knee arthroplasty.  Patient currently ambulating with a cane  Review of Systems morbid obesity, hypertension lumbar spinal stenosis previous L2-3 TLIF.  Cervical spondylosis carpal tunnel syndrome sleep apnea.  Negative for MI or  CVA.   Objective: Vital Signs: BP 125/78   Pulse 72   Ht  (1.753 m)   Wt (!) 327 lb 12.8 oz (148.7 kg)   BMI 48.41 kg/m   Physical Exam Constitutional:      Appearance: She is well-developed.  HENT:     Head: Normocephalic.     Right Ear: External ear normal.     Left Ear: External ear normal. There is no impacted cerumen.  Eyes:     Pupils: Pupils are equal, round, and reactive to light.  Neck:     Thyroid: No thyromegaly.     Trachea: No tracheal deviation.  Cardiovascular:     Rate and Rhythm: Normal rate.  Pulmonary:     Effort: Pulmonary effort is normal.  Abdominal:     Palpations: Abdomen is soft.  Musculoskeletal:     Cervical back: No rigidity.  Skin:    General: Skin is warm and dry.  Neurological:     Mental Status: She is alert and oriented to person, place, and time.  Psychiatric:        Behavior: Behavior normal.     Ortho Exam patient ambulates with a right knee limp ambulating with a cane.  Negative logroll to the hips.  Crepitus with knee range of motion.  Palpable distal pulses.  Specialty Comments:  No specialty comments available.  Imaging: No  results found.   PMFS History: Patient Active Problem List   Diagnosis Date Noted   Bilateral carpal tunnel syndrome 03/12/2023   Positive ANA (antinuclear antibody) 12/05/2022   Vitamin D deficiency 11/27/2022   High risk medication use 04/19/2022   Financial insecurity 02/13/2022   Anxiety and depression 01/30/2022   Hypercholesterolemia 01/30/2022   Primary hypertension 01/30/2022   Rheumatoid arthritis of multiple sites with negative rheumatoid factor 01/30/2022   Osteoarthritis of left knee 06/01/2021   Lumbar spinal stenosis status post L2-L3 TLIF 05/05/2021   Degenerative scoliosis 04/18/2021   Sarcoidosis 05/10/2020   Pain of left hip joint 03/24/2020   Cervical spondylosis without myelopathy 07/16/2019   Neck pain 07/16/2019   Class 3 drug-induced obesity with body mass index  (BMI) of 45.0 to 49.9 in adult 10/18/2018   Primary osteoarthritis of both knees 10/18/2018   Hammertoe of right foot 07/02/2018   OSA (obstructive sleep apnea) 10/26/2014   Past Medical History:  Diagnosis Date   Allergy    Anxiety    Asthma    Depression    GERD (gastroesophageal reflux disease)    Hyperlipidemia    Hypertension    OSA (obstructive sleep apnea)    Pneumonia    Rheumatoid arthritis    Sarcoidosis     No family history on file.  Past Surgical History:  Procedure Laterality Date   ABDOMINAL HYSTERECTOMY     DEBRIDEMENT AND CLOSURE WOUND N/A 06/15/2021   Procedure: Irrigation and debridement and closure of spine wound;  Surgeon: Venita Lick, MD;  Location: [facility] OR;  Service: Orthopedics;  Laterality: N/A;   ESOPHAGOGASTRODUODENOSCOPY     FOOT SURGERY Bilateral    Both surgeries for bunions by Dr. Wynelle Cleveland per pt   TRANSFORAMINAL LUMBAR INTERBODY FUSION (TLIF) WITH PEDICLE SCREW FIXATION 1 LEVEL N/A 05/05/2021   Procedure: TRANSFORAMINAL LUMBAR INTERBODY FUSION (TLIF) WITH PEDICLE SCREW FIXATION 1 LEVEL L2-3;  Surgeon: Venita Lick, MD;  Location: [facility] OR;  Service: Orthopedics;  Laterality: N/A;  4 hrs   Social History   Occupational History   Occupation: payroll  Tobacco Use   Smoking status: Former    Packs/day: 0.10    Years: 2.00    Additional pack years: 0.00    Total pack years: 0.20    Types: Cigarettes    Quit date: 12/25/1996    Years since quitting: 26.3    Passive exposure: Never   Smokeless tobacco: Never   Tobacco comments:    smoked 2-3 cigs daily x 2 years  Vaping Use   Vaping Use: Never used  Substance and Sexual Activity   Alcohol use: Yes    Alcohol/week: 0.0 standard drinks of alcohol    Comment: rarely   Drug use: No   Sexual activity: Not on file

## 2023-04-16 ENCOUNTER — Encounter (INDEPENDENT_AMBULATORY_CARE_PROVIDER_SITE_OTHER): Payer: Medicaid Other | Admitting: Sports Medicine

## 2023-04-16 DIAGNOSIS — Z981 Arthrodesis status: Secondary | ICD-10-CM

## 2023-04-16 NOTE — Telephone Encounter (Signed)
I spent 5 total minutes of online digital evaluation and management services in this patient-initiated request for online care. 

## 2023-04-17 ENCOUNTER — Other Ambulatory Visit (HOSPITAL_COMMUNITY): Payer: Self-pay

## 2023-04-17 ENCOUNTER — Ambulatory Visit: Payer: Medicaid Other | Admitting: Gastroenterology

## 2023-04-17 ENCOUNTER — Encounter: Payer: Self-pay | Admitting: Gastroenterology

## 2023-04-17 VITALS — BP 124/80 | HR 75 | Ht 70.0 in | Wt 331.0 lb

## 2023-04-17 DIAGNOSIS — K219 Gastro-esophageal reflux disease without esophagitis: Secondary | ICD-10-CM | POA: Insufficient documentation

## 2023-04-17 DIAGNOSIS — Z1211 Encounter for screening for malignant neoplasm of colon: Secondary | ICD-10-CM

## 2023-04-17 DIAGNOSIS — R1310 Dysphagia, unspecified: Secondary | ICD-10-CM

## 2023-04-17 DIAGNOSIS — K5900 Constipation, unspecified: Secondary | ICD-10-CM

## 2023-04-17 MED ORDER — NA SULFATE-K SULFATE-MG SULF 17.5-3.13-1.6 GM/177ML PO SOLN: 1.0000 | Freq: Once | ORAL | 0 refills | Status: AC

## 2023-04-17 MED ORDER — PANTOPRAZOLE SODIUM 40 MG PO TBEC: 40.0000 mg | DELAYED_RELEASE_TABLET | Freq: Every day | ORAL | 5 refills | Status: DC

## 2023-04-17 MED ORDER — POLYETHYLENE GLYCOL 3350 17 G PO PACK: 17.0000 g | PACK | Freq: Every day | ORAL | 0 refills | Status: DC

## 2023-04-17 NOTE — Patient Instructions (Addendum)
You have been scheduled for an endoscopy and colonoscopy. Please follow the written instructions given to you at your visit today. Please pick up your prep supplies at the pharmacy within the next 1-3 days. If you use inhalers (even only as needed), please bring them with you on the day of your procedure.  We have sent the following medications to your pharmacy for you to pick up at your convenience: Pantoprazole 40 mg: Take once daily  Please purchase the following medications over the counter and take as directed: Miralax: Take once daily  Thank you for entrusting me with your care and for choosing [employer]   If your blood pressure at your visit was 140/90 or greater, please contact your primary care physician to follow up on this. ______________________________________________________  If you are age 54 or older, your body mass index should be between 23-30. Your Body mass index is 47.49 kg/m. If this is out of the aforementioned range listed, please consider follow up with your Primary Care Provider.  If you are age 90 or younger, your body mass index should be between 19-25. Your Body mass index is 47.49 kg/m. If this is out of the aformentioned range listed, please consider follow up with your Primary Care Provider.  ________________________________________________________  The [facility] providers would like to encourage you to use [facility] to communicate with providers for non-urgent requests or questions.  Due to long hold times on the telephone, sending your provider a message by [facility] may be a faster and more efficient way to get a response.  Please allow 48 business hours for a response.  Please remember that this is for non-urgent requests.  _______________________________________________________  Due to recent changes in healthcare laws, you may see the results of your imaging and laboratory studies on MyChart before your provider has had a  chance to review them.  We understand that in some cases there may be results that are confusing or concerning to you. Not all laboratory results come back in the same time frame and the provider may be waiting for multiple results in order to interpret others.  Please give [place] 48 hours in order for your provider to thoroughly review all the results before contacting the office for clarification of your results.

## 2023-04-17 NOTE — Progress Notes (Signed)
04/17/2023 KAY SHIPPY [id number] 1971-01-24   HISTORY OF PRESENT ILLNESS: This is a pleasant 52 year old female who has been referred here by Quita Skye, PA-C, for evaluation of her GERD.  She says that she has had issues with acid reflux for a while.  Currently taking Prilosec OTC.  Says that she was on Prevacid in the past, but that was remotely.  She reports having an EGD several years ago and had her esophagus dilated, which she says helped her complaints of dysphagia at that time.  She says that as of late she feels like she is "getting strangled" when she is eating and taking pills, etc.  Says it feels like things get hung up.  This does has not happen every day, but may be like once a week or so with the food.  She does have acid reflux despite taking the Prilosec OTC.  She just had an upper GI study in March in preparation for bariatric surgery that showed a tiny hiatal hernia and GERD.  No stricture, etc.  She has never had a colonoscopy in the past.  She reports issues with chronic constipation, only using MiraLAX as needed.  Seen remotely by Dr. Christella Hartigan.   Past Medical History:  Diagnosis Date   Allergy    Anxiety    Asthma    Degeneration of lumbar intervertebral disc    Depression    DM (diabetes mellitus)    GERD (gastroesophageal reflux disease)    Hyperlipidemia    Hypertension    OSA (obstructive sleep apnea)    Osteoarthritis of both knees    Pneumonia    Rheumatoid arthritis    Sarcoidosis    Spinal stenosis    Vitamin D deficiency    Past Surgical History:  Procedure Laterality Date   ABDOMINAL HYSTERECTOMY     DEBRIDEMENT AND CLOSURE WOUND N/A 06/15/2021   Procedure: Irrigation and debridement and closure of spine wound;  Surgeon: Venita Lick, MD;  Location: [facility] OR;  Service: Orthopedics;  Laterality: N/A;   ESOPHAGOGASTRODUODENOSCOPY     FOOT SURGERY Bilateral    Both surgeries for bunions by Dr. Wynelle Cleveland per pt   TRANSFORAMINAL LUMBAR  INTERBODY FUSION (TLIF) WITH PEDICLE SCREW FIXATION 1 LEVEL N/A 05/05/2021   Procedure: TRANSFORAMINAL LUMBAR INTERBODY FUSION (TLIF) WITH PEDICLE SCREW FIXATION 1 LEVEL L2-3;  Surgeon: Venita Lick, MD;  Location: [facility] OR;  Service: Orthopedics;  Laterality: N/A;  4 hrs    reports that she quit smoking about 26 years ago. Her smoking use included cigarettes. She has a 0.20 pack-year smoking history. She has never been exposed to tobacco smoke. She has never used smokeless tobacco. She reports current alcohol use. She reports that she does not use drugs. family history is not on file. Allergies  Allergen Reactions   Lisinopril Cough      Outpatient Encounter Medications as of 04/17/2023  Medication Sig   Abatacept (ORENCIA CLICKJECT) 125 MG/ML SOAJ Inject 125 mg into the skin once a week.   albuterol (VENTOLIN HFA) 108 (90 Base) MCG/ACT inhaler Inhale 2 puffs into the lungs every 6 (six) hours as needed for wheezing or shortness of breath.   amLODipine (NORVASC) 10 MG tablet Take 1 tablet (10 mg total) by mouth daily.   atorvastatin (LIPITOR) 20 MG tablet Take 1 tablet (20 mg total) by mouth daily.   buPROPion (WELLBUTRIN SR) 150 MG 12 hr tablet Take 1 tablet (150 mg total) by mouth 2 (two) times daily.  carvedilol (COREG) 12.5 MG tablet Take 1 tablet (12.5 mg total) by mouth 2 (two) times daily with a meal.   cetirizine (ZYRTEC) 10 MG tablet Take 1 tablet by mouth daily.   diclofenac (VOLTAREN) 75 MG EC tablet TAKE 1 TABLET BY MOUTH TWICE DAILY WITH MEALS   fluticasone (FLONASE) 50 MCG/ACT nasal spray Place into the nose.   losartan-hydrochlorothiazide (HYZAAR) 100-25 MG tablet Take 1 tablet by mouth daily.   Melatonin 10 MG CAPS Take 20 mg by mouth at bedtime as needed (sleep).   Na Sulfate-K Sulfate-Mg Sulf 17.5-3.13-1.6 GM/177ML SOLN Take 1 kit by mouth once for 1 dose.   pantoprazole (PROTONIX) 40 MG tablet Take 1 tablet (40 mg total) by mouth daily.   polyethylene glycol (MIRALAX) 17 g  packet Take 17 g by mouth daily.   traMADol (ULTRAM) 50 MG tablet Take 1 tablet (50 mg total) by mouth every 8 (eight) hours as needed for moderate pain.   escitalopram (LEXAPRO) 20 MG tablet Take by mouth.   No facility-administered encounter medications on file as of 04/17/2023.     REVIEW OF SYSTEMS  : All other systems reviewed and negative except where noted in the History of Present Illness.   PHYSICAL EXAM: BP 124/80   Pulse 75   Ht  (1.778 m)   Wt (!) 331 lb (150.1 kg)   SpO2 96%   BMI 47.49 kg/m  General: Well developed female in no acute distress Head: Normocephalic and atraumatic Eyes:  Sclerae anicteric, conjunctiva pink. Ears: Normal auditory acuity Lungs: Clear throughout to auscultation; no W/R/R. Heart: Regular rate and rhythm; no M/R/G. Rectal:  Will be done at the time of colonoscopy. Musculoskeletal: Symmetrical with no gross deformities  Skin: No lesions on visible extremities Extremities: No edema  Neurological: Alert oriented x 4, grossly non-focal Psychological:  Alert and cooperative. Normal mood and affect  ASSESSMENT AND PLAN: *GERD and dysphagia:  Just had an UGI study in March that showed only a tiny hiatal hernia and GERD.  Only taking prilosec OTC.  Will change to pantoprazole 40 mg daily.  Prescription sent to pharmacy.  Will proceed with EGD with Dr. Russella Dar to rule out occult stricture (consider empiric dilation as she says that she had dilation in the remote past and it did help) and to rule out EoE, etc.   *CRC screening:  Never had a colonoscopy in the past.  Will schedule with Dr. Russella Dar.  Will give 2 day bowel prep due to underlying constipation. *Constipation:  Only uses Miralax prn.  Will begin using it daily.  **The risks, benefits, and alternatives to EGD and colonoscopy were discussed with the patient and she consents to proceed.   CC:  Quita Skye, PA-C

## 2023-04-20 ENCOUNTER — Other Ambulatory Visit (HOSPITAL_COMMUNITY): Payer: Self-pay

## 2023-04-23 ENCOUNTER — Ambulatory Visit: Payer: Medicaid Other | Admitting: Internal Medicine

## 2023-04-25 ENCOUNTER — Encounter: Payer: Self-pay | Admitting: Internal Medicine

## 2023-04-25 ENCOUNTER — Ambulatory Visit: Payer: Medicaid Other | Attending: Internal Medicine | Admitting: Internal Medicine

## 2023-04-25 VITALS — BP 133/77 | HR 64 | Resp 16 | Ht 70.0 in | Wt 329.0 lb

## 2023-04-25 DIAGNOSIS — M542 Cervicalgia: Secondary | ICD-10-CM | POA: Diagnosis not present

## 2023-04-25 DIAGNOSIS — Z79899 Other long term (current) drug therapy: Secondary | ICD-10-CM

## 2023-04-25 DIAGNOSIS — M25511 Pain in right shoulder: Secondary | ICD-10-CM | POA: Insufficient documentation

## 2023-04-25 DIAGNOSIS — M25512 Pain in left shoulder: Secondary | ICD-10-CM

## 2023-04-25 DIAGNOSIS — M0609 Rheumatoid arthritis without rheumatoid factor, multiple sites: Secondary | ICD-10-CM

## 2023-04-25 DIAGNOSIS — G8929 Other chronic pain: Secondary | ICD-10-CM

## 2023-04-25 MED ORDER — CYCLOBENZAPRINE HCL 10 MG PO TABS
10.0000 mg | ORAL_TABLET | Freq: Every evening | ORAL | 2 refills | Status: DC | PRN
Start: 2023-04-25 — End: 2023-10-31

## 2023-04-25 NOTE — Progress Notes (Signed)
Office Visit Note  Patient: Robin [name]             Date of Birth: Nov 05, 1971           MRN: [id number]             PCP: Quita Skye, PA-C Referring: Quita Skye, PA-C Visit Date: 04/25/2023   Subjective:  Follow-up (Patient states she has a lot of pain in her right elbow. Patient states she feels like her shoulders are cracking when she raises her arms.)   History of Present Illness: DEVRI [name] is a 52 y.o. female here for follow up for seronegative RA now on Orencia 125 mg subcu weekly.  She started with first dose of medication at pharmacy clinic appointment on March 6.  Has not experienced any difficulty with the medication or injection site reactions.  Labs checked from April 11 looks fine no problems with new medication start.  So far feels pretty good with this though she still has significant ongoing joint pain.  Worst complaint today in her shoulders and at the right elbow.  Previous HPI 02/19/23 Robin [name] is a 52 y.o. female here for follow up for seronegative RA on Humira 40 mg subcu q. 14 days.  We just met last month discussed possibly switching to Actemra due to decreased Humira efficacy.  However after reviewing side effect risks in more detail she is very concerned about this medication and was to look at other options.  Including possible side effects affecting her blood pressure or pancreatitis or liver function.   Previous HPI 01/10/23 Robin [name] is a 52 y.o. female here for follow up for seronegative RA on Humira 40 mg [place] q14days. Since our last visit she has a lot of ongoing joint pain her worst affected joints are in both knees where she has known severe osteoarthritis.  She is planned for completing series of visco supplementation injections. This is ongoing as a temporizing measure she is not currently candidate for joint replacement surgery due to morbid obesity. She is seeing weight loss clinic for this.  Besides that she  still having some ongoing joint pain and swelling in her upper extremities and increase in facial rashes.   Previous HPI 12/05/22 Robin [name] is a 52 y.o. female here for follow up for seronegative RA on Humira 40 mg [place] q14days. She had updated labs checked in family medicine clinic November with negative RA serology and normal inflammatory markers. Some improvement in swelling with Humira but continues to have a lot of pain on a daily basis, worst in knees with known OA also in both hands, right elbow, left shoulder worse. She had injections to her knees with only a few days of benefit. On discussion of lab findings and notes reviewed, patient reports previously thought to have sarcoid with abnormal CXR and eye inflammation problems but was not on disease specific treatment at that time. She also had abnormal labs concerning for lupus for years when younger does not recall the exact details.     Labs reviewed 10/26/22 ANA pos RF neg CCP neg ESR wnl CRP wnl Vit D 1,25 wnl   Previous HPI 04/18/22 Robin [name] is a 52 y.o. female here for seronegative RA, previously a patient with Dr. Deanne Coffer. She was taking Humira previously interrupted due to loss of coverage and leflunomide with questionable response in symptoms. She was diagnosed after visit in 11/2019 with multiple areas of joint pain and frequent  swelling particularly in bilateral wrists.  Laboratory work-up at that time including rheumatoid factor, CCP, ACE, HLA-B27, sed rate, and CRP were all negative.  She was started on methotrexate and the leflunomide with lack of response in her symptoms.  She started on Humira for this and took it for a few months but was then off the medication for at least about a year due to change in insurance status after losing her job.  She restarted this since 2 months ago so far has not seen a very impressive change in her symptoms.  She is significantly limited by severe lower back pain with  degenerative arthritis pursuing disability for this.  Also has advanced osteoarthritis of bilateral knees.  Shoulder pain limits overhead movement or worsened with pressure.  Gets chronic hand pain worse around the thumbs but especially on the radial side of her wrist with fairly chronic swelling currently worse than normal on the right side.  She has had local steroid joint injections in multiple areas but none in the past year these were typically beneficial for at least weeks in the past.  For her osteoarthritis pain she takes diclofenac twice daily with a pretty good benefit has not experienced any side effects or problems taking this medicine.    DMARD Hx Humira - Current, loss of response? MTX and LEF - inadequate response   Review of Systems  Constitutional:  Positive for fatigue.  HENT:  Positive for mouth sores and mouth dryness.   Eyes:  Positive for dryness.  Respiratory:  Positive for shortness of breath.   Cardiovascular:  Negative for chest pain and palpitations.  Gastrointestinal:  Positive for constipation. Negative for blood in stool and diarrhea.  Endocrine: Negative for increased urination.  Genitourinary:  Positive for involuntary urination.  Musculoskeletal:  Positive for joint pain, gait problem, joint pain, joint swelling, myalgias, muscle weakness, morning stiffness, muscle tenderness and myalgias.  Skin:  Positive for rash and sensitivity to sunlight. Negative for color change and hair loss.  Allergic/Immunologic: Positive for susceptible to infections.  Neurological:  Negative for dizziness and headaches.  Hematological:  Negative for swollen glands.  Psychiatric/Behavioral:  Positive for depressed mood and sleep disturbance. The patient is not nervous/anxious.     PMFS History:  Patient Active Problem List   Diagnosis Date Noted   Bilateral shoulder pain 04/25/2023   Dysphagia 04/17/2023   Gastroesophageal reflux disease without esophagitis 04/17/2023   Colon  cancer screening 04/17/2023   Constipation 04/17/2023   Bilateral carpal tunnel syndrome 03/12/2023   Positive ANA (antinuclear antibody) 12/05/2022   Vitamin D deficiency 11/27/2022   High risk medication use 04/19/2022   Financial insecurity 02/13/2022   Anxiety and depression 01/30/2022   Hypercholesterolemia 01/30/2022   Primary hypertension 01/30/2022   Rheumatoid arthritis of multiple sites with negative rheumatoid factor (HCC) 01/30/2022   Osteoarthritis of left knee 06/01/2021   Lumbar spinal stenosis status post L2-L3 TLIF 05/05/2021   Degenerative scoliosis 04/18/2021   Sarcoidosis 05/10/2020   Pain of left hip joint 03/24/2020   Cervical spondylosis without myelopathy 07/16/2019   Neck pain 07/16/2019   Class 3 drug-induced obesity with body mass index (BMI) of 45.0 to 49.9 in adult (HCC) 10/18/2018   Primary osteoarthritis of both knees 10/18/2018   Hammertoe of right foot 07/02/2018   OSA (obstructive sleep apnea) 10/26/2014    Past Medical History:  Diagnosis Date   Allergy    Anxiety    Asthma    Bilateral carpal tunnel syndrome  Degeneration of lumbar intervertebral disc    Depression    DM (diabetes mellitus) (HCC)    GERD (gastroesophageal reflux disease)    Hyperlipidemia    Hypertension    OSA (obstructive sleep apnea)    Osteoarthritis of both knees    Pneumonia    Rheumatoid arthritis (HCC)    Sarcoidosis    Spinal stenosis    Vitamin D deficiency     Family History  Problem Relation Age of Onset   Colon polyps Mother    Colon cancer Neg Hx    Stomach cancer Neg Hx    Esophageal cancer Neg Hx    Rectal cancer Neg Hx    Past Surgical History:  Procedure Laterality Date   ABDOMINAL HYSTERECTOMY     DEBRIDEMENT AND CLOSURE WOUND N/A 06/15/2021   Procedure: Irrigation and debridement and closure of spine wound;  Surgeon: Venita Lick, MD;  Location: [facility] OR;  Service: Orthopedics;  Laterality: N/A;   ESOPHAGOGASTRODUODENOSCOPY     FOOT  SURGERY Bilateral    Both surgeries for bunions by Dr. Wynelle Cleveland per pt   TRANSFORAMINAL LUMBAR INTERBODY FUSION (TLIF) WITH PEDICLE SCREW FIXATION 1 LEVEL N/A 05/05/2021   Procedure: TRANSFORAMINAL LUMBAR INTERBODY FUSION (TLIF) WITH PEDICLE SCREW FIXATION 1 LEVEL L2-3;  Surgeon: Venita Lick, MD;  Location: [facility] OR;  Service: Orthopedics;  Laterality: N/A;  4 hrs   Social History   Social History Narrative   Not on file   Immunization History  Administered Date(s) Administered   Influenza,inj,quad, With Preservative 09/25/2019   Influenza-Unspecified 10/20/2017   MODERNA COVID-19 SARS-COV-2 PEDS BIVALENT BOOSTER 6Y-11Y 01/07/2020, 02/04/2020   Unspecified SARS-COV-2 Vaccination 01/07/2020, 02/04/2020     Objective: Vital Signs: BP 133/77 (BP Location: Left Arm, Patient Position: Sitting, Cuff Size: Normal)   Pulse 64   Resp 16   Ht 5\' 10"  (1.778 m)   Wt (!) 329 lb (149.2 kg)   BMI 47.21 kg/m    Physical Exam Eyes:     Conjunctiva/sclera: Conjunctivae normal.  Cardiovascular:     Rate and Rhythm: Normal rate and regular rhythm.  Pulmonary:     Effort: Pulmonary effort is normal.     Breath sounds: Normal breath sounds.  Lymphadenopathy:     Cervical: No cervical adenopathy.  Skin:    General: Skin is warm and dry.     Findings: Rash present.     Comments: Hyperpigmentation changes over central area of cheeks bilaterally no papules or lesions  Neurological:     Mental Status: She is alert.  Psychiatric:        Mood and Affect: Mood normal.      Musculoskeletal Exam:  Shoulder range of motion intact but pain with active overhead abduction, some popping and clicking with movement especially while elevated Elbows full ROM right elbow tenderness no palpable swelling, pain is not reproduced by resisted pronation or supination Wrists full ROM no tenderness, cyst on dorsal side of right wrist Fingers full ROM left first CMC joint tenderness to pressure Knees with  tenderness to pressure along medial joint line bilaterally, no effusions  CDAI Exam: CDAI Score: 7  Patient Global: 30 mm; Provider Global: 10 mm Swollen: 0 ; Tender: 3  Joint Exam 04/25/2023      Right  Left  MCP 1      Tender  Knee   Tender   Tender     Investigation: No additional findings.  Imaging: DG Epidural/Nerve Root  Result Date: 04/30/2023 CLINICAL DATA:  Lumbosacral spondylosis without myelopathy with radiculopathy. Chronic low back pain and bilateral leg weakness. Advanced spinal canal stenosis L3-L4 and L4-L5. Partial response to left L5 and S1 nerve root blocks month ago. Repeat injection requested. EXAM: EPIDURAL/NERVE ROOT FLUOROSCOPY TIME:  Radiation Exposure Index (as provided by the fluoroscopic device): 19.0 mGy Kerma PROCEDURE: The procedure, risks, benefits, and alternatives were explained to the patient. Questions regarding the procedure were encouraged and answered. The patient understands and consents to the procedure. LEFT L5 NERVE ROOT BLOCK AND TRANSFORAMINAL EPIDURAL: A posterior oblique approach was taken to the intervertebral foramen on the left at L5-S1 using a curved 6 inch 22 gauge spinal needle. Injection of Isovue M 200 outlined the left L5 nerve root and showed good epidural spread. No vascular opacification is seen. 80 mg of Depo-Medrol mixed with 2 mL of 1% lidocaine were instilled. The procedure was well-tolerated, and the patient was discharged thirty minutes following the injection in good condition. COMPLICATIONS: None immediate. IMPRESSION: Technically successful injection consisting of a left L5 nerve root block and transforaminal epidural. Electronically Signed   By: Obie Dredge M.D.   On: 04/30/2023 12:39    Recent Labs: Lab Results  Component Value Date   WBC 12.5 (H) 04/05/2023   HGB 13.1 04/05/2023   PLT 342 04/05/2023   NA 139 04/05/2023   K 4.1 04/05/2023   CL 104 04/05/2023   CO2 29 04/05/2023   GLUCOSE 88 04/05/2023   BUN 15  04/05/2023   CREATININE 0.98 04/05/2023   BILITOT 0.4 04/05/2023   ALKPHOS 96 04/19/2022   AST 16 04/05/2023   ALT 17 04/05/2023   PROT 6.8 04/05/2023   ALBUMIN 3.5 04/19/2022   CALCIUM 9.4 04/05/2023   GFRAA  10/12/2009    >60        The eGFR has been calculated using the MDRD equation. This calculation has not been validated in all clinical situations. eGFR's persistently <60 mL/min signify possible Chronic Kidney Disease.   QFTBGOLDPLUS NEGATIVE 04/05/2023    Speciality Comments: No specialty comments available.  Procedures:  No procedures performed Allergies: Lisinopril   Assessment / Plan:     Visit Diagnoses: Rheumatoid arthritis of multiple sites with negative rheumatoid factor (HCC)  Exam with less peripheral synovitis appreciated compared to her previous examination.  She still describing a lot of pain in multiple areas but looks reassuring for response to treatment so far.  Is still pretty currently on slightly under 2 months since new treatment start with Orencia.  Bilateral knee pain not much improved probably due to substantial existing osteoarthritis.  Continue Orencia 125 mg subcu weekly.  High risk medication use  No problems with [place] so far recent labs drawn in pulmonology clinic.  Personally reviewed today showing mildly elevated total white blood cell count but with negative complete metabolic panel and negative TB screening.  Chronic pain of both shoulders - Plan: cyclobenzaprine (FLEXERIL) 10 MG tablet  The ongoing shoulder pain does not seem suggestive for active joint inflammation more likely rotator cuff tendinopathy.  Discussed stretching range of motion exercises for this.  Also prescription for Flexeril 10 mg can take in evening as needed.  Orders: No orders of the defined types were placed in this encounter.  Meds ordered this encounter  Medications   cyclobenzaprine (FLEXERIL) 10 MG tablet    Sig: Take 1 tablet (10 mg total) by mouth at  bedtime as needed.    Dispense:  30 tablet    Refill:  2  Follow-Up Instructions: Return in about 3 months (around 07/26/2023) for RA on ABA f/u 3mos.   Fuller Plan, MD  Note - This record has been created using [employer].  Chart creation errors have been sought, but may not always  have been located. Such creation errors do not reflect on  the standard of medical care.

## 2023-04-26 ENCOUNTER — Ambulatory Visit: Payer: Medicaid Other | Admitting: Sports Medicine

## 2023-04-27 ENCOUNTER — Ambulatory Visit: Payer: Medicaid Other | Admitting: Sports Medicine

## 2023-04-30 ENCOUNTER — Ambulatory Visit
Admission: RE | Admit: 2023-04-30 | Discharge: 2023-04-30 | Disposition: A | Discharge status: Home / Self Care | Payer: Medicaid Other | Source: Ambulatory Visit | Attending: Sports Medicine | Admitting: Sports Medicine

## 2023-04-30 DIAGNOSIS — Z981 Arthrodesis status: Secondary | ICD-10-CM

## 2023-04-30 MED ORDER — IOPAMIDOL (ISOVUE-M 200) INJECTION 41%
1.0000 mL | Freq: Once | INTRAMUSCULAR | Status: AC
  Administered 2023-04-30: 1 mL via EPIDURAL

## 2023-04-30 MED ORDER — METHYLPREDNISOLONE ACETATE 40 MG/ML INJ SUSP (RADIOLOG
80.0000 mg | Freq: Once | INTRAMUSCULAR | Status: AC
  Administered 2023-04-30: 80 mg via EPIDURAL

## 2023-05-01 ENCOUNTER — Ambulatory Visit: Payer: Medicaid Other | Attending: Cardiology | Admitting: Student

## 2023-05-01 ENCOUNTER — Encounter: Payer: Self-pay | Admitting: Student

## 2023-05-01 VITALS — BP 126/81 | HR 72

## 2023-05-01 DIAGNOSIS — I10 Essential (primary) hypertension: Secondary | ICD-10-CM | POA: Diagnosis not present

## 2023-05-01 NOTE — Patient Instructions (Addendum)
No medications changes made by your pharmacist Carmela Hurt, PharmD at today's visit: Continue taking amlodipine 10 mg daily, Coreg 12.5 mg twice daily,Hyzaar 100/25 daily Bring all of your meds, your BP cuff and your record of home blood pressures to your next appointment.    HOW TO TAKE YOUR BLOOD PRESSURE AT HOME  Rest 5 minutes before taking your blood pressure.  Don't smoke or drink caffeinated beverages for at least 30 minutes before. Take your blood pressure before (not after) you eat. Sit comfortably with your back supported and both feet on the floor (don't cross your legs). Elevate your arm to heart level on a table or a desk. Use the proper sized cuff. It should fit smoothly and snugly around your bare upper arm. There should be enough room to slip a fingertip under the cuff. The bottom edge of the cuff should be 1 inch above the crease of the elbow. Ideally, take 3 measurements at one sitting and record the average.  Important lifestyle changes to control high blood pressure  Intervention  Effect on the BP  Lose extra pounds and watch your waistline Weight loss is one of the most effective lifestyle changes for controlling blood pressure. If you're overweight or obese, losing even a small amount of weight can help reduce blood pressure. Blood pressure might go down by about 1 millimeter of mercury (mm Hg) with each kilogram (about 2.2 pounds) of weight lost.  Exercise regularly As a general goal, aim for at least 30 minutes of moderate physical activity every day. Regular physical activity can lower high blood pressure by about 5 to 8 mm Hg.  Eat a healthy diet Eating a diet rich in whole grains, fruits, vegetables, and low-fat dairy products and low in saturated fat and cholesterol. A healthy diet can lower high blood pressure by up to 11 mm Hg.  Reduce salt (sodium) in your diet Even a small reduction of sodium in the diet can improve heart health and reduce high blood pressure  by about 5 to 6 mm Hg.  Limit alcohol One drink equals 12 ounces of beer, 5 ounces of wine, or 1.5 ounces of 80-proof liquor.  Limiting alcohol to less than one drink a day for women or two drinks a day for men can help lower blood pressure by about 4 mm Hg.   If you have any questions or concerns please use My Chart to send questions or call the office at [phone]

## 2023-05-01 NOTE — Progress Notes (Signed)
Patient ID: Robin [name]                 DOB: 1971/04/26                      MRN: [id number]      HPI: Robin [name] is a 52 y.o. female referred by Dr. Carolan Clines to HTN clinic. PMH is significant for HTN, OSA,HDL, Obesity, RA.  Patient presented today for BP follow up. Reports she does not check her BP at home but she has been to many OV lately and each visits her BP ~130-133/72. Only once it was high 150/86 due to back pain. She has been taking all her mediations regularly,uses pill box. She has join weight management program(started in March). She has implemented major dietary changes. So far she has lost 10 lbs. She has been doing some chair exercise and water aerobics 2-3 times per week. She is planning to buy home BP monitor.   Family History:  Father: diabetes Mother: RA, Diabetes  Brother: blood cancer  Social History:  Alcohol: none Smoking: none  Diet: very low salt intake- follow diet plan provided by weight loss clinic   Exercise: chair exercises 2-3 days per week at home water aerobics -2 -3 days week for 1-2 hours    Home BP readings: 133-135/75-80 heart rate 75   Wt Readings from Last 3 Encounters:  04/25/23 (!) 329 lb (149.2 kg)  04/17/23 (!) 331 lb (150.1 kg)  04/10/23 (!) 327 lb 12.8 oz (148.7 kg)   BP Readings from Last 3 Encounters:  05/01/23 126/81  04/30/23 133/72  04/25/23 133/77   Pulse Readings from Last 3 Encounters:  05/01/23 72  04/30/23 72  04/25/23 64    Renal function: CrCl cannot be calculated (Patient's most recent lab result is older than the maximum 21 days allowed.).  Past Medical History:  Diagnosis Date   Allergy    Anxiety    Asthma    Bilateral carpal tunnel syndrome    Degeneration of lumbar intervertebral disc    Depression    DM (diabetes mellitus) (HCC)    GERD (gastroesophageal reflux disease)    Hyperlipidemia    Hypertension    OSA (obstructive sleep apnea)    Osteoarthritis of both knees     Pneumonia    Rheumatoid arthritis (HCC)    Sarcoidosis    Spinal stenosis    Vitamin D deficiency     Current Outpatient Medications on File Prior to Visit  Medication Sig Dispense Refill   Abatacept (ORENCIA CLICKJECT) 125 MG/ML SOAJ Inject 125 mg into the skin once a week. 4 mL 1   albuterol (VENTOLIN HFA) 108 (90 Base) MCG/ACT inhaler Inhale 2 puffs into the lungs every 6 (six) hours as needed for wheezing or shortness of breath. 18 g 2   amLODipine (NORVASC) 10 MG tablet Take 1 tablet (10 mg total) by mouth daily. 30 tablet 11   atorvastatin (LIPITOR) 20 MG tablet Take 1 tablet (20 mg total) by mouth daily. 30 tablet 11   buPROPion (WELLBUTRIN SR) 150 MG 12 hr tablet Take 1 tablet (150 mg total) by mouth 2 (two) times daily. 60 tablet 11   carvedilol (COREG) 12.5 MG tablet Take 1 tablet (12.5 mg total) by mouth 2 (two) times daily with a meal. 60 tablet 5   cetirizine (ZYRTEC) 10 MG tablet Take 1 tablet by mouth daily.     cyclobenzaprine (FLEXERIL) 10 MG tablet  Take 1 tablet (10 mg total) by mouth at bedtime as needed. 30 tablet 2   diclofenac (VOLTAREN) 75 MG EC tablet TAKE 1 TABLET BY MOUTH TWICE DAILY WITH MEALS 60 tablet 0   escitalopram (LEXAPRO) 20 MG tablet Take by mouth.     fluticasone (FLONASE) 50 MCG/ACT nasal spray Place into the nose.     losartan-hydrochlorothiazide (HYZAAR) 100-25 MG tablet Take 1 tablet by mouth daily. (Patient not taking: Reported on 04/25/2023)     losartan-hydrochlorothiazide (HYZAAR) 50-12.5 MG tablet Take 2 tablets by mouth daily.     Melatonin 10 MG CAPS Take 20 mg by mouth at bedtime as needed (sleep).     Na Sulfate-K Sulfate-Mg Sulf 17.5-3.13-1.6 GM/177ML SOLN Take by mouth. (Patient not taking: Reported on 04/25/2023)     pantoprazole (PROTONIX) 40 MG tablet Take 1 tablet (40 mg total) by mouth daily. 30 tablet 5   polyethylene glycol (MIRALAX) 17 g packet Take 17 g by mouth daily. 14 each 0   traMADol (ULTRAM) 50 MG tablet Take 1 tablet (50 mg  total) by mouth every 8 (eight) hours as needed for moderate pain. 90 tablet 0   No current facility-administered medications on file prior to visit.    Allergies  Allergen Reactions   Lisinopril Cough    Blood pressure 126/81, pulse 72, SpO2 96 %.   Assessment/Plan:  1. Hypertension -  Primary hypertension Assessment: BP is controlled in office BP 128/81 mmHg HR 72 (goal <130/80) Takes current BP mediations regularly and tolerates them well without any side effects Denies SOB, palpitation, chest pain, headaches,or swelling Reiterated the importance of regular exercise and low salt diet    Plan:  Continue taking amlodipine 10 mg daily, Coreg 12.5 mg twice daily,Hyzaar 100/25 daily Patient to keep record of BP readings with heart rate and report to [place] at the next visit Bring new home monitor for validation at the next appointment  Patient to see PharmD in 2 months  for follow up  Follow up lab(s): none   Thank you  Carmela Hurt, Pharm.D [facility] A Division of [facility] [address] [place], [place] [address]  Phone: [phone]; Fax: [phone]

## 2023-05-01 NOTE — Assessment & Plan Note (Signed)
Assessment: BP is controlled in office BP 128/81 mmHg HR 72 (goal <130/80) Takes current BP mediations regularly and tolerates them well without any side effects Denies SOB, palpitation, chest pain, headaches,or swelling Reiterated the importance of regular exercise and low salt diet    Plan:  Continue taking amlodipine 10 mg daily, Coreg 12.5 mg twice daily,Hyzaar 100/25 daily Patient to keep record of BP readings with heart rate and report to [place] at the next visit Bring new home monitor for validation at the next appointment  Patient to see PharmD in 2 months  for follow up  Follow up lab(s): none

## 2023-05-08 ENCOUNTER — Ambulatory Visit (AMBULATORY_SURGERY_CENTER): Payer: Medicaid Other | Admitting: Gastroenterology

## 2023-05-08 ENCOUNTER — Encounter: Payer: Self-pay | Admitting: Gastroenterology

## 2023-05-08 VITALS — BP 119/74 | HR 69 | Temp 97.3°F | Resp 15 | Ht 70.0 in | Wt 331.0 lb

## 2023-05-08 DIAGNOSIS — K219 Gastro-esophageal reflux disease without esophagitis: Secondary | ICD-10-CM | POA: Diagnosis not present

## 2023-05-08 DIAGNOSIS — K2970 Gastritis, unspecified, without bleeding: Secondary | ICD-10-CM | POA: Diagnosis not present

## 2023-05-08 DIAGNOSIS — R1310 Dysphagia, unspecified: Secondary | ICD-10-CM

## 2023-05-08 DIAGNOSIS — K449 Diaphragmatic hernia without obstruction or gangrene: Secondary | ICD-10-CM

## 2023-05-08 DIAGNOSIS — Z1211 Encounter for screening for malignant neoplasm of colon: Secondary | ICD-10-CM | POA: Diagnosis not present

## 2023-05-08 DIAGNOSIS — K2950 Unspecified chronic gastritis without bleeding: Secondary | ICD-10-CM | POA: Diagnosis not present

## 2023-05-08 MED ORDER — SODIUM CHLORIDE 0.9 % IV SOLN
500.0000 mL | INTRAVENOUS | Status: DC
Start: 2023-05-08 — End: 2023-05-08

## 2023-05-08 NOTE — Op Note (Signed)
[facility] Patient Name: Robin [name] Procedure Date: 05/08/2023 2:32 PM MRN: [id number] Endoscopist: Meryl Dare , MD, [phone] Age: 52 Referring MD:  Date of Birth: [birth year] Gender: Female Account #: [id number] Procedure:                Colonoscopy Indications:              Screening for colorectal malignant neoplasm Medicines:                Monitored Anesthesia Care Procedure:                Pre-Anesthesia Assessment:                           - Prior to the procedure, a History and Physical                            was performed, and patient medications and                            allergies were reviewed. The patient's tolerance of                            previous anesthesia was also reviewed. The risks                            and benefits of the procedure and the sedation                            options and risks were discussed with the patient.                            All questions were answered, and informed consent                            was obtained. Prior Anticoagulants: The patient has                            taken no anticoagulant or antiplatelet agents. ASA                            Grade Assessment: III - A patient with severe                            systemic disease. After reviewing the risks and                            benefits, the patient was deemed in satisfactory                            condition to undergo the procedure.                           After obtaining informed consent, the colonoscope  was passed under direct vision. Throughout the                            procedure, the patient's blood pressure, pulse, and                            oxygen saturations were monitored continuously. The                            CF [id number] #[id number] was introduced through the anus                            and advanced to the the cecum, identified by                             appendiceal orifice and ileocecal valve. The                            ileocecal valve, appendiceal orifice, and rectum                            were photographed. The quality of the bowel                            preparation was good. The colonoscopy was performed                            without difficulty. The patient tolerated the                            procedure well. Scope In: 2:41:49 PM Scope Out: 2:53:59 PM Scope Withdrawal Time: 0 hours 10 minutes 28 seconds  Total Procedure Duration: 0 hours 12 minutes 10 seconds  Findings:                 The perianal and digital rectal examinations were                            normal.                           A few small-mouthed diverticula were found in the                            sigmoid colon.                           The exam was otherwise without abnormality on                            direct and retroflexion views. Complications:            No immediate complications. Estimated blood loss:                            None. Estimated Blood Loss:  Estimated blood loss: none. Impression:               - Mild diverticulosis in the sigmoid colon.                           - The examination was otherwise normal on direct                            and retroflexion views.                           - No specimens collected. Recommendation:           - Repeat colonoscopy in 10 years for screening                            purposes.                           - Patient has a contact number available for                            emergencies. The signs and symptoms of potential                            delayed complications were discussed with the                            patient. Return to normal activities tomorrow.                            Written discharge instructions were provided to the                            patient.                           - High fiber diet.                           - Continue  present medications. Meryl Dare, MD 05/08/2023 2:57:26 PM This report has been signed electronically.

## 2023-05-08 NOTE — Progress Notes (Signed)
See 04/17/2023 H&P, no changes

## 2023-05-08 NOTE — Patient Instructions (Signed)
Please read handouts provided. Continue present medications. Await pathology results. Dilation Diet. Follow antireflux measures. High Fiber Diet.  YOU HAD AN ENDOSCOPIC PROCEDURE TODAY AT THE [facility]:   Refer to the procedure report that was given to you for any specific questions about what was found during the examination.  If the procedure report does not answer your questions, please call your gastroenterologist to clarify.  If you requested that your care partner not be given the details of your procedure findings, then the procedure report has been included in a sealed envelope for you to review at your convenience later.  YOU SHOULD EXPECT: Some feelings of bloating in the abdomen. Passage of more gas than usual.  Walking can help get rid of the air that was put into your GI tract during the procedure and reduce the bloating. If you had a lower endoscopy (such as a colonoscopy or flexible sigmoidoscopy) you may notice spotting of blood in your stool or on the toilet paper. If you underwent a bowel prep for your procedure, you may not have a normal bowel movement for a few days.  Please Note:  You might notice some irritation and congestion in your nose or some drainage.  This is from the oxygen used during your procedure.  There is no need for concern and it should clear up in a day or so.  SYMPTOMS TO REPORT IMMEDIATELY:  Following lower endoscopy (colonoscopy or flexible sigmoidoscopy):  Excessive amounts of blood in the stool  Significant tenderness or worsening of abdominal pains  Swelling of the abdomen that is new, acute  Fever of 100F or higher  Following upper endoscopy (EGD)  Vomiting of blood or coffee ground material  New chest pain or pain under the shoulder blades  Painful or persistently difficult swallowing  New shortness of breath  Fever of 100F or higher  Black, tarry-looking stools  For urgent or emergent issues, a gastroenterologist can be  reached at any hour by calling [phone]. Do not use MyChart messaging for urgent concerns.    DIET:  Drink plenty of fluids but you should avoid alcoholic beverages for 24 hours.  ACTIVITY:  You should plan to take it easy for the rest of today and you should NOT DRIVE or use heavy machinery until tomorrow (because of the sedation medicines used during the test).    FOLLOW UP: Our staff will call the number listed on your records the next business day following your procedure.  We will call around 7:15- 8:00 am to check on you and address any questions or concerns that you may have regarding the information given to you following your procedure. If we do not reach you, we will leave a message.     If any biopsies were taken you will be contacted by phone or by letter within the next 1-3 weeks.  Please call us at [phone] if you have not heard about the biopsies in 3 weeks.    SIGNATURES/CONFIDENTIALITY: You and/or your care partner have signed paperwork which will be entered into your electronic medical record.  These signatures attest to the fact that that the information above on your After Visit Summary has been reviewed and is understood.  Full responsibility of the confidentiality of this discharge information lies with you and/or your care-partner.

## 2023-05-08 NOTE — Progress Notes (Signed)
Pt's states no medical or surgical changes since previsit or office visit. 

## 2023-05-08 NOTE — Op Note (Signed)
[facility] Patient Name: Robin [name] Procedure Date: 05/08/2023 2:31 PM MRN: [id number] Endoscopist: Meryl Dare , MD, [phone] Age: 52 Referring MD:  Date of Birth: 1971-05-26 Gender: Female Account #: [id number] Procedure:                Upper GI endoscopy Indications:              Dysphagia, Gastroesophageal reflux disease Medicines:                Monitored Anesthesia Care Procedure:                Pre-Anesthesia Assessment:                           - Prior to the procedure, a History and Physical                            was performed, and patient medications and                            allergies were reviewed. The patient's tolerance of                            previous anesthesia was also reviewed. The risks                            and benefits of the procedure and the sedation                            options and risks were discussed with the patient.                            All questions were answered, and informed consent                            was obtained. Prior Anticoagulants: The patient has                            taken no anticoagulant or antiplatelet agents. ASA                            Grade Assessment: III - A patient with severe                            systemic disease. After reviewing the risks and                            benefits, the patient was deemed in satisfactory                            condition to undergo the procedure.                           After obtaining informed consent, the endoscope was  passed under direct vision. Throughout the                            procedure, the patient's blood pressure, pulse, and                            oxygen saturations were monitored continuously. The                            Olympus Scope [id number] was introduced through the                            mouth, and advanced to the second part of duodenum.                             The upper GI endoscopy was accomplished without                            difficulty. The patient tolerated the procedure                            well. Scope In: Scope Out: Findings:                 No endoscopic abnormality was evident in the                            esophagus to explain the patient's complaint of                            dysphagia. It was decided, however, to proceed with                            dilation of the entire esophagus. A guidewire was                            placed and the scope was withdrawn. Dilation was                            performed with a Savary dilator with no resistance                            at 17 mm. Biopsies were taken with a cold forceps                            for histology. Biopsies were taken with a cold                            forceps for histology.                           A single area of ectopic gastric mucosa was found  in the proximal esophagus.                           Patchy mild inflammation characterized by erythema                            and granularity was found in the gastric body.                            Biopsies were taken with a cold forceps for                            histology.                           A small hiatal hernia was present.                           The exam of the stomach was otherwise normal.                           The duodenal bulb and second portion of the                            duodenum were normal. Complications:            No immediate complications. Estimated Blood Loss:     Estimated blood loss was minimal. Impression:               - No endoscopic esophageal abnormality to explain                            patient's dysphagia. Esophagus dilated. Biopsied.                           - Esophageal inlet patch.                           - Gastritis. Biopsied.                           - Small hiatal hernia.                            - Normal duodenal bulb and second portion of the                            duodenum. Recommendation:           - Patient has a contact number available for                            emergencies. The signs and symptoms of potential                            delayed complications were discussed with the  patient. Return to normal activities tomorrow.                            Written discharge instructions were provided to the                            patient.                           - Clear liquid diet for 2 hours, then advance as                            tolerated to soft diet today.                           - Resume prior diet tomrrow.                           - Follow antireflux measures.                           - Continue present medications.                           - Await pathology results. Meryl Dare, MD 05/08/2023 3:12:59 PM This report has been signed electronically.

## 2023-05-08 NOTE — Progress Notes (Signed)
Uneventful anesthetic. Report to pacu rn. Vss. Are resumed by rn.

## 2023-05-08 NOTE — Progress Notes (Signed)
Called to room to assist during endoscopic procedure.  Patient ID and intended procedure confirmed with present staff. Received instructions for my participation in the procedure from the performing physician.  

## 2023-05-09 ENCOUNTER — Encounter: Payer: Self-pay | Admitting: Internal Medicine

## 2023-05-09 ENCOUNTER — Telehealth: Payer: Self-pay | Admitting: *Deleted

## 2023-05-09 NOTE — Telephone Encounter (Signed)
  Follow up Call-     05/08/2023    2:11 PM  Call back number  Post procedure Call Back phone  # [phone]  Permission to leave phone message Yes     Patient questions:  Do you have a fever, pain , or abdominal swelling? No. Pain Score  0 *  Have you tolerated food without any problems? Yes.    Have you been able to return to your normal activities? Yes.    Do you have any questions about your discharge instructions: Diet   No. Medications  No. Follow up visit  No.  Do you have questions or concerns about your Care? No.  Actions: * If pain score is 4 or above: No action needed, pain <4.

## 2023-05-16 ENCOUNTER — Other Ambulatory Visit (HOSPITAL_COMMUNITY): Payer: Self-pay

## 2023-05-16 ENCOUNTER — Other Ambulatory Visit: Payer: Self-pay | Admitting: Internal Medicine

## 2023-05-16 DIAGNOSIS — M0609 Rheumatoid arthritis without rheumatoid factor, multiple sites: Secondary | ICD-10-CM

## 2023-05-16 DIAGNOSIS — Z79899 Other long term (current) drug therapy: Secondary | ICD-10-CM

## 2023-05-16 MED ORDER — ORENCIA CLICKJECT 125 MG/ML ~~LOC~~ SOAJ
125.0000 mg | SUBCUTANEOUS | 1 refills | Status: DC
Start: 2023-05-16 — End: 2023-07-10
  Filled 2023-05-16: qty 4, 28d supply, fill #0
  Filled 2023-06-13: qty 4, 28d supply, fill #1

## 2023-05-16 NOTE — Telephone Encounter (Signed)
Last Fill: 02/28/2023  Labs: 04/05/2023 Lab results look fine for the orencia. her white blood cell count is mildly elevated but this has been the case on her previous results as well. Metabolic panel is normal   TB Gold: 04/05/2023  Negative  Next Visit: 07/30/2023  Last Visit: 04/25/2023  DX: office note not complete  Current Dose per office note 04/25/2023: not mentioned  Okay to refill Orencia?

## 2023-05-17 ENCOUNTER — Other Ambulatory Visit: Payer: Self-pay

## 2023-05-21 ENCOUNTER — Other Ambulatory Visit: Payer: Self-pay | Admitting: Sports Medicine

## 2023-05-21 DIAGNOSIS — M170 Bilateral primary osteoarthritis of knee: Secondary | ICD-10-CM

## 2023-05-24 ENCOUNTER — Encounter: Payer: Self-pay | Admitting: Gastroenterology

## 2023-06-12 ENCOUNTER — Ambulatory Visit (HOSPITAL_BASED_OUTPATIENT_CLINIC_OR_DEPARTMENT_OTHER): Payer: Medicaid Other | Attending: Nurse Practitioner | Admitting: Pulmonary Disease

## 2023-06-12 DIAGNOSIS — Z6841 Body Mass Index (BMI) 40.0 and over, adult: Secondary | ICD-10-CM | POA: Diagnosis not present

## 2023-06-12 DIAGNOSIS — G4733 Obstructive sleep apnea (adult) (pediatric): Secondary | ICD-10-CM | POA: Insufficient documentation

## 2023-06-12 DIAGNOSIS — R0683 Snoring: Secondary | ICD-10-CM | POA: Diagnosis present

## 2023-06-13 ENCOUNTER — Other Ambulatory Visit: Payer: Self-pay | Admitting: Sports Medicine

## 2023-06-13 ENCOUNTER — Other Ambulatory Visit (HOSPITAL_COMMUNITY): Payer: Self-pay

## 2023-06-13 DIAGNOSIS — G4733 Obstructive sleep apnea (adult) (pediatric): Secondary | ICD-10-CM

## 2023-06-13 DIAGNOSIS — M170 Bilateral primary osteoarthritis of knee: Secondary | ICD-10-CM

## 2023-06-13 NOTE — Procedures (Signed)
Patient Name: Robin, [name] Date: 06/12/2023 Gender: Female D.O.B: Jul 03, 1971 Age (years): 59 Referring Provider: Noemi Chapel NP Height (inches): 69 Interpreting Physician: Coralyn Helling MD, ABSM Weight (lbs): 330 RPSGT: Ulyess Mort BMI: 49 MRN: [id number] Neck Size: 17.50  CLINICAL INFORMATION Sleep Study Type: Split Night CPAP  Indication for sleep study: Fatigue, Hypertension, Obesity, OSA, Snoring  Epworth Sleepiness Score: 10  SLEEP STUDY TECHNIQUE As per the AASM Manual for the Scoring of Sleep and Associated Events v2.3 (April 2016) with a hypopnea requiring 4% desaturations.  The channels recorded and monitored were frontal, central and occipital EEG, electrooculogram (EOG), submentalis EMG (chin), nasal and oral airflow, thoracic and abdominal wall motion, anterior tibialis EMG, snore microphone, electrocardiogram, and pulse oximetry. Continuous positive airway pressure (CPAP) was initiated when the patient met split night criteria and was titrated according to treat sleep-disordered breathing.  MEDICATIONS Medications self-administered by patient taken the night of the study : N/A  RESPIRATORY PARAMETERS Diagnostic  Total AHI (/hr): 30.7 RDI (/hr): 42.9 OA Index (/hr): - CA Index (/hr): 0.0 REM AHI (/hr): N/A NREM AHI (/hr): 30.7 Supine AHI (/hr): N/A Non-supine AHI (/hr): 30.7 Min O2 Sat (%): 88.0 Mean O2 (%): 92.1 Time below 88% (min): 0.5   Titration  Optimal Pressure (cm): 14 AHI at Optimal Pressure (/hr): 7.1 Min O2 at Optimal Pressure (%): 91.0 Supine % at Optimal (%): 0 Sleep % at Optimal (%): 97   SLEEP ARCHITECTURE The recording time for the entire night was 379.2 minutes.  During a baseline period of 182.3 minutes, the patient slept for 137.0 minutes in REM and nonREM, yielding a sleep efficiency of 75.2%. Sleep onset after lights out was 11.2 minutes with a REM latency of N/A minutes. The patient spent 8.4% of the night  in stage N1 sleep, 91.6% in stage N2 sleep, 0.0% in stage N3 and 0% in REM.  During the titration period of 188.2 minutes, the patient slept for 171.0 minutes in REM and nonREM, yielding a sleep efficiency of 90.9%. Sleep onset after CPAP initiation was 7.7 minutes with a REM latency of 60.0 minutes. The patient spent 3.5% of the night in stage N1 sleep, 60.2% in stage N2 sleep, 0.0% in stage N3 and 36.3% in REM.  CARDIAC DATA The 2 lead EKG demonstrated sinus rhythm. The mean heart rate was 100.0 beats per minute. Other EKG findings include: PVCs.  LEG MOVEMENT DATA The total Periodic Limb Movements of Sleep (PLMS) were 0. The PLMS index was 0.0 .  IMPRESSIONS - Severe obstructive sleep apnea with an AHI of 30.7 and SpO2 low of 88%. - She did well with CPAP at 14 cm H2O.  She was observed in REM but not supine sleep with this pressure setting. - She did now need supplemental oxygen during this study.  DIAGNOSIS - Obstructive Sleep Apnea (G47.33)  RECOMMENDATIONS - Trial of CPAP therapy on 14 cm H2O with a Large size Fisher&Paykel Full Face Evora Full mask and heated humidification. - Avoid alcohol, sedatives and other CNS depressants that may worsen sleep apnea and disrupt normal sleep architecture. - Sleep hygiene should be reviewed to assess factors that may improve sleep quality. - Weight management and regular exercise should be initiated or continued.  [Electronically signed] 06/13/2023 09:01 AM  Coralyn Helling MD, ABSM Diplomate, American Board of Sleep Medicine NPI: [id number]  [facility] PH: [phone]   FX: [phone] ACCREDITED BY THE AMERICAN ACADEMY OF SLEEP  MEDICINE

## 2023-06-14 ENCOUNTER — Other Ambulatory Visit: Payer: Self-pay | Admitting: Sports Medicine

## 2023-06-14 DIAGNOSIS — M170 Bilateral primary osteoarthritis of knee: Secondary | ICD-10-CM

## 2023-06-14 MED ORDER — TRAMADOL HCL 50 MG PO TABS
50.0000 mg | ORAL_TABLET | Freq: Three times a day (TID) | ORAL | 0 refills | Status: DC | PRN
Start: 2023-06-14 — End: 2023-10-31

## 2023-06-14 NOTE — Telephone Encounter (Signed)
Letter written and patient can download from MyChart

## 2023-06-21 ENCOUNTER — Telehealth: Payer: Self-pay | Admitting: Nurse Practitioner

## 2023-06-21 NOTE — Progress Notes (Signed)
Patient ID: Robin [name]                 DOB: 1971-06-04                      MRN: [id number]      HPI: Robin [name] is a 52 y.o. female referred by Dr. Carolan Clines to HTN clinic. PMH is significant for HTN, OSA,HDL, Obesity, RA. Patient saw Dr.Branch on 01/31/2023 her coreg was increased from 6.25 mg twice daily to 12.5 mg twice daily and  losartan- HCTZ  dose was increased from 100/12.5 to 100/25 mg daily and patient was advised to continue Norvasc 10 mg daily. Patient saw me on 05/01/2023, no medication changed as BP was controlled.  Patient presented today for BP follow up. She forgot to bring home BP monitor for validation. Reports her home BP ~ 140-150/90-98 range. She does not sit and wait before checking her BP at home. She takes her medications regularly. She will be going for gastric bypass in Aug she received medical clearance. She went for sleep study and found out she has severe sleep apnea. Has not started using CPAP yet. Exercise regularly and follows low salt diet.    Family History:  Father: diabetes Mother: RA, Diabetes  Brother: blood cancer  Social History:  Alcohol: none Smoking: none  Diet: very low salt intake- follow diet plan provided by weight loss clinic   Exercise: chair exercises 2-3 days per week at home, water aerobics -2 -3 days week for 1-2 hours    Home BP readings: ~ 140-150/90-98   Wt Readings from Last 3 Encounters:  06/12/23 (!) 330 lb (149.7 kg)  05/08/23 (!) 331 lb (150.1 kg)  04/25/23 (!) 329 lb (149.2 kg)   BP Readings from Last 3 Encounters:  06/22/23 133/82  05/08/23 119/74  05/01/23 126/81   Pulse Readings from Last 3 Encounters:  06/22/23 79  05/08/23 69  05/01/23 72    Renal function: CrCl cannot be calculated (Patient's most recent lab result is older than the maximum 21 days allowed.).  Past Medical History:  Diagnosis Date   Allergy    Anxiety    Asthma    Bilateral carpal tunnel syndrome     Degeneration of lumbar intervertebral disc    Depression    DM (diabetes mellitus) (HCC)    GERD (gastroesophageal reflux disease)    Hyperlipidemia    Hypertension    OSA (obstructive sleep apnea)    Osteoarthritis of both knees    Pneumonia    Rheumatoid arthritis (HCC)    Sarcoidosis    Spinal stenosis    Vitamin D deficiency     Current Outpatient Medications on File Prior to Visit  Medication Sig Dispense Refill   Abatacept (ORENCIA CLICKJECT) 125 MG/ML SOAJ Inject 125 mg into the skin once a week. 4 mL 1   albuterol (VENTOLIN HFA) 108 (90 Base) MCG/ACT inhaler Inhale 2 puffs into the lungs every 6 (six) hours as needed for wheezing or shortness of breath. 18 g 2   amLODipine (NORVASC) 10 MG tablet Take 1 tablet (10 mg total) by mouth daily. 30 tablet 11   atorvastatin (LIPITOR) 20 MG tablet Take 1 tablet (20 mg total) by mouth daily. 30 tablet 11   buPROPion (WELLBUTRIN SR) 150 MG 12 hr tablet Take 1 tablet (150 mg total) by mouth 2 (two) times daily. 60 tablet 11   carvedilol (COREG) 12.5 MG tablet Take 1  tablet (12.5 mg total) by mouth 2 (two) times daily with a meal. 60 tablet 5   cetirizine (ZYRTEC) 10 MG tablet Take 1 tablet by mouth daily.     cyclobenzaprine (FLEXERIL) 10 MG tablet Take 1 tablet (10 mg total) by mouth at bedtime as needed. 30 tablet 2   diclofenac (VOLTAREN) 75 MG EC tablet TAKE 1 TABLET BY MOUTH TWICE DAILY WITH MEALS 60 tablet 0   escitalopram (LEXAPRO) 20 MG tablet Take by mouth.     fluticasone (FLONASE) 50 MCG/ACT nasal spray Place into the nose.     losartan-hydrochlorothiazide (HYZAAR) 100-25 MG tablet Take 1 tablet by mouth daily.     Melatonin 10 MG CAPS Take 20 mg by mouth at bedtime as needed (sleep).     Na Sulfate-K Sulfate-Mg Sulf 17.5-3.13-1.6 GM/177ML SOLN Take by mouth.     pantoprazole (PROTONIX) 40 MG tablet Take 1 tablet (40 mg total) by mouth daily. 30 tablet 5   polyethylene glycol (MIRALAX) 17 g packet Take 17 g by mouth daily. 14  each 0   traMADol (ULTRAM) 50 MG tablet Take 1 tablet (50 mg total) by mouth 3 (three) times daily as needed. 90 tablet 0   No current facility-administered medications on file prior to visit.    Allergies  Allergen Reactions   Lisinopril Cough    Blood pressure 133/82, pulse 79, SpO2 99 %.   Assessment/Plan:  1. Hypertension -  Primary hypertension Assessment: BP is slightly elevated  in office BP 133/82 mmHg HR 79 (goal <130/80) Takes current BP mediations regularly and tolerates them well without any side effects Denies SOB, palpitation, chest pain, headaches,or swelling Follows low salt diet and exercises regularly   Plan:  Continue taking amlodipine 10 mg daily, Coreg 12.5 mg twice daily,Hyzaar 100/25 daily Patient to keep record of BP readings with heart rate and report to [place] at the next visit Bring new home monitor for validation at the next appointment  Patient to see PharmD in 1 months  for follow up  Follow up lab(s): none   Thank you  Carmela Hurt, Pharm.D [facility] A Division of [facility] [address] [place], [place] [address]  Phone: [phone]; Fax: [phone]

## 2023-06-22 ENCOUNTER — Encounter: Payer: Self-pay | Admitting: Sports Medicine

## 2023-06-22 ENCOUNTER — Ambulatory Visit: Payer: Medicaid Other | Attending: Cardiovascular Disease | Admitting: Student

## 2023-06-22 ENCOUNTER — Encounter: Payer: Self-pay | Admitting: Student

## 2023-06-22 VITALS — BP 133/82 | HR 79

## 2023-06-22 DIAGNOSIS — I10 Essential (primary) hypertension: Secondary | ICD-10-CM

## 2023-06-22 NOTE — Patient Instructions (Addendum)
No medication changes made by your pharmacist Aahan Marques, PharmD at today's visit:    Bring your BP cuff and your record of home blood pressures to your next appointment.    HOW TO TAKE YOUR BLOOD PRESSURE AT HOME  Rest 5 minutes before taking your blood pressure.  Don't smoke or drink caffeinated beverages for at least 30 minutes before. Take your blood pressure before (not after) you eat. Sit comfortably with your back supported and both feet on the floor (don't cross your legs). Elevate your arm to heart level on a table or a desk. Use the proper sized cuff. It should fit smoothly and snugly around your bare upper arm. There should be enough room to slip a fingertip under the cuff. The bottom edge of the cuff should be 1 inch above the crease of the elbow. Ideally, take 3 measurements at one sitting and record the average.  Important lifestyle changes to control high blood pressure  Intervention  Effect on the BP  Lose extra pounds and watch your waistline Weight loss is one of the most effective lifestyle changes for controlling blood pressure. If you're overweight or obese, losing even a small amount of weight can help reduce blood pressure. Blood pressure might go down by about 1 millimeter of mercury (mm Hg) with each kilogram (about 2.2 pounds) of weight lost.  Exercise regularly As a general goal, aim for at least 30 minutes of moderate physical activity every day. Regular physical activity can lower high blood pressure by about 5 to 8 mm Hg.  Eat a healthy diet Eating a diet rich in whole grains, fruits, vegetables, and low-fat dairy products and low in saturated fat and cholesterol. A healthy diet can lower high blood pressure by up to 11 mm Hg.  Reduce salt (sodium) in your diet Even a small reduction of sodium in the diet can improve heart health and reduce high blood pressure by about 5 to 6 mm Hg.  Limit alcohol One drink equals 12 ounces of beer, 5 ounces of wine, or 1.5  ounces of 80-proof liquor.  Limiting alcohol to less than one drink a day for women or two drinks a day for men can help lower blood pressure by about 4 mm Hg.   If you have any questions or concerns please use My Chart to send questions or call the office at [phone]  

## 2023-06-22 NOTE — Assessment & Plan Note (Signed)
Assessment: BP is slightly elevated  in office BP 133/82 mmHg HR 79 (goal <130/80) Takes current BP mediations regularly and tolerates them well without any side effects Denies SOB, palpitation, chest pain, headaches,or swelling Follows low salt diet and exercises regularly   Plan:  Continue taking amlodipine 10 mg daily, Coreg 12.5 mg twice daily,Hyzaar 100/25 daily Patient to keep record of BP readings with heart rate and report to [place] at the next visit Bring new home monitor for validation at the next appointment  Patient to see PharmD in 1 months  for follow up  Follow up lab(s): none

## 2023-06-25 NOTE — Telephone Encounter (Signed)
Called and spoke with pt about split night results. Pt verbalized understanding, appointment has been scheduled for 08/07/23 for f/u. NFN

## 2023-06-27 ENCOUNTER — Encounter: Payer: Self-pay | Admitting: Sports Medicine

## 2023-06-27 DIAGNOSIS — Z981 Arthrodesis status: Secondary | ICD-10-CM

## 2023-07-02 ENCOUNTER — Encounter: Payer: Self-pay | Admitting: Internal Medicine

## 2023-07-02 NOTE — Telephone Encounter (Signed)
For assessment of long-term disability I recommend she see physical therapy for a formal functional capacity exam.  This is able to determine her abilities in better detail and would include all needed information for insurance or Social Security.

## 2023-07-02 NOTE — Telephone Encounter (Signed)
Patient will reach out to Dr. Shon Baton for functional capacity test and disability paper work.

## 2023-07-02 NOTE — Telephone Encounter (Signed)
Patient was last seen on 04/25/2023. Patient is currently prescribed Orencia and Flexeril. Patient is scheduled to be seen on 07/30/2023. Please advise.

## 2023-07-10 ENCOUNTER — Other Ambulatory Visit: Payer: Self-pay | Admitting: Internal Medicine

## 2023-07-10 ENCOUNTER — Other Ambulatory Visit (HOSPITAL_COMMUNITY): Payer: Self-pay

## 2023-07-10 ENCOUNTER — Other Ambulatory Visit: Payer: Self-pay

## 2023-07-10 DIAGNOSIS — M0609 Rheumatoid arthritis without rheumatoid factor, multiple sites: Secondary | ICD-10-CM

## 2023-07-10 DIAGNOSIS — Z79899 Other long term (current) drug therapy: Secondary | ICD-10-CM

## 2023-07-10 MED ORDER — ORENCIA CLICKJECT 125 MG/ML ~~LOC~~ SOAJ
125.0000 mg | SUBCUTANEOUS | 0 refills | Status: DC
Start: 2023-07-10 — End: 2023-08-03
  Filled 2023-07-10 – 2023-07-11 (×2): qty 4, 28d supply, fill #0

## 2023-07-10 NOTE — Telephone Encounter (Signed)
Last Fill: 05/16/2023  Labs: 05/23/2023 CMP normal 04/05/2023 CBC: WBC 12.5, Lymphs Abs 6,000, Absolute Monocytes 1,050  TB Gold: 04/05/2023  Negative  Next Visit: 07/30/2023  Last Visit: 04/25/2023  NA:TFTDDUKGUR arthritis of multiple sites with negative rheumatoid factor   Current Dose per office note 04/25/2023: Orencia 125 mg subcu weekly   Contacted the patient to advise one of her labs are due. Patient states she has an upcomming appointment but may come into the walk in lab.   Okay to refill Orencia?

## 2023-07-11 ENCOUNTER — Other Ambulatory Visit (HOSPITAL_COMMUNITY): Payer: Self-pay

## 2023-07-11 ENCOUNTER — Other Ambulatory Visit: Payer: Self-pay

## 2023-07-20 ENCOUNTER — Ambulatory Visit
Payer: Medicaid Other | Attending: Cardiovascular Disease | Admitting: Pharmacist Clinician (PhC)/ Clinical Pharmacy Specialist

## 2023-07-20 ENCOUNTER — Encounter: Payer: Self-pay | Admitting: Pharmacist Clinician (PhC)/ Clinical Pharmacy Specialist

## 2023-07-20 VITALS — BP 132/81 | HR 85

## 2023-07-20 DIAGNOSIS — I10 Essential (primary) hypertension: Secondary | ICD-10-CM

## 2023-07-20 NOTE — Assessment & Plan Note (Signed)
Assessment: BP is controlled in office BP 132/81 mmHg Home readings show diastolic still elevated, home meter diastolic was 7 points higher than office Tolerates current medications well without any side effects Denies SOB, palpitation, chest pain, headaches,or swelling Reiterated the importance of regular exercise and low salt diet  Gastric sleeve surgery in August - reviewed how we might see some drop in BP as weight decreases  Plan:  Move amlodipine to evenings No other medication changes at this time Patient to keep record of BP readings with heart rate and report to [place] at the next visit Patient to follow up with Dr. Wyline Mood in 2 weeks  Labs ordered today:  none After surgery, reach out to office if notice that home BP readings drop with weight loss

## 2023-07-20 NOTE — Patient Instructions (Signed)
Follow up appointment: with Dr. Wyline Mood on August 9  Take your BP meds as follows: move amlodipine to nighttime - with second carvedilol dose  Check your blood pressure at home daily (if able) and keep record of the readings.  Hypertension "High blood pressure"  Hypertension is often called "The Silent Killer." It rarely causes symptoms until it is extremely  high or has done damage to other organs in the body. For this reason, you should have your  blood pressure checked regularly by your physician. We will check your blood pressure  every time you see a provider at one of our offices.   Your blood pressure reading consists of two numbers. Ideally, blood pressure should be  below 120/80. The first ("top") number is called the systolic pressure. It measures the  pressure in your arteries as your heart beats. The second ("bottom") number is called the diastolic pressure. It measures the pressure in your arteries as the heart relaxes between beats.  The benefits of getting your blood pressure under control are enormous. A 10-point  reduction in systolic blood pressure can reduce your risk of stroke by 27% and heart failure by 28%  Your blood pressure goal is < 130/80  To check your pressure at home you will need to:  1. Sit up in a chair, with feet flat on the floor and back supported. Do not cross your ankles or legs. 2. Rest your left arm so that the cuff is about heart level. If the cuff goes on your upper arm,  then just relax the arm on the table, arm of the chair or your lap. If you have a wrist cuff, we  suggest relaxing your wrist against your chest (think of it as Pledging the Flag with the  wrong arm).  3. Place the cuff snugly around your arm, about 1 inch above the crook of your elbow. The  cords should be inside the groove of your elbow.  4. Sit quietly, with the cuff in place, for about 5 minutes. After that 5 minutes press the power  button to start a reading. 5. Do  not talk or move while the reading is taking place.  6. Record your readings on a sheet of paper. Although most cuffs have a memory, it is often  easier to see a pattern developing when the numbers are all in front of you.  7. You can repeat the reading after 1-3 minutes if it is recommended  Make sure your bladder is empty and you have not had caffeine or tobacco within the last 30 min  Always bring your blood pressure log with you to your appointments. If you have not brought your monitor in to be double checked for accuracy, please bring it to your next appointment.  You can find a list of quality blood pressure cuffs at validatebp.org

## 2023-07-20 NOTE — Progress Notes (Signed)
Office Visit    Patient Name: Robin [name] Date of Encounter: 07/20/2023  Primary Care Provider:  Quita Skye, PA-C Primary Cardiologist:  Maisie Fus, MD  Chief Complaint    Hypertension  Significant Past Medical History   HLD 5/24 LDL 107 on atorvastatin 20  OSA Working to get CPAP, recently diagnosed  RA On Orencia, diclofenac, tramadol  Obesity Gastric bypass scheduled for August  preDM 5/24 A1c 6.1    Allergies  Allergen Reactions   Lisinopril Cough    History of Present Illness    Robin [name] is a 52 y.o. female patient of Dr Carolan Clines, in the office today for blood pressure follow up.  She was most recently seen last month by Carmela Hurt PharmD.  Her pressure seems well controlled on a combination of losartan-hctz, carvedilol and amlodipine.  At that visit she noted home readings were higher than in office, with readings in the 140-150's systolic.  She was asked to return in a month and bring her home BP device for validation.  Today she returns for follow up.  She notes that when she put fresh batteries in her BP device, the readings dropped significantly.  See below.  She is feeling well overall and looking forward to getting gastric sleeve surgery in August.  She is in need of knee replacements and possible back surgery, but has to get her weight down for any of that to happen.    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 officer)   Social History:  Alcohol: none Smoking: none Caffeine:  1 cup of coffee daily   Diet: very low salt intake- follow diet plan provided by weight loss clinic , working with nutritionist   Exercise: chair exercises 2-3 days per week at home,  water aerobics -2 -3 days week for 1-2 hours  aquatic therapy evaluation next week  Home BP readings:  last 18 days - Omron cuff on forearm, reads within 10 points of office cuff  AM average 130/86  HR 77 (range 112-140/80-92)  PM average 130/82  HR 76 (range 120-140/72-90)   Accessory Clinical Findings    Lab Results  Component Value Date   CREATININE 0.98 04/05/2023   BUN 15 04/05/2023   NA 139 04/05/2023   K 4.1 04/05/2023   CL 104 04/05/2023   CO2 29 04/05/2023   Lab Results  Component Value Date   ALT 17 04/05/2023   AST 16 04/05/2023   ALKPHOS 96 04/19/2022   BILITOT 0.4 04/05/2023   Lab Results  Component Value Date   HGBA1C 5.6 01/30/2022    Home Medications    Current Outpatient Medications  Medication Sig Dispense Refill   Abatacept (ORENCIA CLICKJECT) 125 MG/ML SOAJ Inject 125 mg into the skin once a week. 4 mL 0   albuterol (VENTOLIN HFA) 108 (90 Base) MCG/ACT inhaler Inhale 2 puffs into the lungs every 6 (six) hours as needed for wheezing or shortness of breath. 18 g 2   amLODipine (NORVASC) 10 MG tablet Take 1 tablet (10 mg total) by mouth daily. 30 tablet 11   atorvastatin (LIPITOR) 20 MG tablet Take 1 tablet (20 mg total) by mouth daily. 30 tablet 11  buPROPion (WELLBUTRIN SR) 150 MG 12 hr tablet Take 1 tablet (150 mg total) by mouth 2 (two) times daily. 60 tablet 11   carvedilol (COREG) 12.5 MG tablet Take 1 tablet (12.5 mg total) by mouth 2 (two) times daily with a meal. 60 tablet 5   cetirizine (ZYRTEC) 10 MG tablet Take 1 tablet by mouth daily.     cyclobenzaprine (FLEXERIL) 10 MG tablet Take 1 tablet (10 mg total) by mouth at bedtime as needed. 30 tablet 2   diclofenac (VOLTAREN) 75 MG EC tablet TAKE 1 TABLET BY MOUTH TWICE DAILY WITH MEALS 60 tablet 0   escitalopram (LEXAPRO) 20 MG tablet Take by mouth.     fluticasone (FLONASE) 50 MCG/ACT nasal spray Place into the nose.     losartan-hydrochlorothiazide (HYZAAR) 100-25 MG tablet Take 1 tablet by  mouth daily.     Melatonin 10 MG CAPS Take 20 mg by mouth at bedtime as needed (sleep).     Na Sulfate-K Sulfate-Mg Sulf 17.5-3.13-1.6 GM/177ML SOLN Take by mouth.     pantoprazole (PROTONIX) 40 MG tablet Take 1 tablet (40 mg total) by mouth daily. 30 tablet 5   polyethylene glycol (MIRALAX) 17 g packet Take 17 g by mouth daily. 14 each 0   traMADol (ULTRAM) 50 MG tablet Take 1 tablet (50 mg total) by mouth 3 (three) times daily as needed. 90 tablet 0   No current facility-administered medications for this visit.         Assessment & Plan    Primary hypertension Assessment: BP is controlled in office BP 132/81 mmHg Home readings show diastolic still elevated, home meter diastolic was 7 points higher than office Tolerates current medications well without any side effects Denies SOB, palpitation, chest pain, headaches,or swelling Reiterated the importance of regular exercise and low salt diet  Gastric sleeve surgery in August - reviewed how we might see some drop in BP as weight decreases  Plan:  Move amlodipine to evenings No other medication changes at this time Patient to keep record of BP readings with heart rate and report to [place] at the next visit Patient to follow up with Dr. Wyline Mood in 2 weeks  Labs ordered today:  none After surgery, reach out to office if notice that home BP readings drop with weight loss   Phillips Hay PharmD CPP [facility]  [address] [place], [place] [address] [phone]

## 2023-07-24 NOTE — Progress Notes (Signed)
Office Visit Note  Patient: Robin [name]             Date of Birth: 02-Apr-1971           MRN: [id number]             PCP: Quita Skye, PA-C Referring: Quita Skye, PA-C Visit Date: 07/30/2023   Subjective:  Follow-up (Patient states she had a cortisone injection in her left shoulder last week. )   History of Present Illness: Robin [name] is a 52 y.o. female here for follow up for seronegative RA now on Orencia 125 mg subcu weekly.  She not had any major flareup with joint pain or swelling.  Is noticing some ongoing swelling worst around the base of the thumb on left and right hand occasionally involving the second finger as well.  She saw her [facility] provider for pain in both shoulders and across the upper back.  Had x-rays for this and had a left shoulder steroid injection with Dr. Ranell Patrick last week.  So far this is beneficial discussed plan to try on the right side for this in a week or 2 if it is continuing to help.  She is walking with cane for assistance for bilateral knee pain this sometimes causes her right elbow to hurt a little bit.  Previous HPI 04/25/2023 Robin [name] is a 52 y.o. female here for follow up for seronegative RA now on Orencia 125 mg subcu weekly.  She started with first dose of medication at pharmacy clinic appointment on March 6.  Has not experienced any difficulty with the medication or injection site reactions.  Labs checked from April 11 looks fine no problems with new medication start.  So far feels pretty good with this though she still has significant ongoing joint pain.  Worst complaint today in her shoulders and at the right elbow.   Previous HPI 02/19/23 Robin [name] is a 52 y.o. female here for follow up for seronegative RA on Humira 40 mg subcu q. 14 days.  We just met last month discussed possibly switching to Actemra due to decreased Humira efficacy.  However after reviewing side effect risks in more detail she is  very concerned about this medication and was to look at other options.  Including possible side effects affecting her blood pressure or pancreatitis or liver function.   Previous HPI 01/10/23 Robin [name] is a 52 y.o. female here for follow up for seronegative RA on Humira 40 mg [place] q14days. Since our last visit she has a lot of ongoing joint pain her worst affected joints are in both knees where she has known severe osteoarthritis.  She is planned for completing series of visco supplementation injections. This is ongoing as a temporizing measure she is not currently candidate for joint replacement surgery due to morbid obesity. She is seeing weight loss clinic for this.  Besides that she still having some ongoing joint pain and swelling in her upper extremities and increase in facial rashes.   Previous HPI 12/05/22 Robin [name] is a 52 y.o. female here for follow up for seronegative RA on Humira 40 mg [place] q14days. She had updated labs checked in family medicine clinic November with negative RA serology and normal inflammatory markers. Some improvement in swelling with Humira but continues to have a lot of pain on a daily basis, worst in knees with known OA also in both hands, right elbow, left shoulder worse. She had injections to her  knees with only a few days of benefit. On discussion of lab findings and notes reviewed, patient reports previously thought to have sarcoid with abnormal CXR and eye inflammation problems but was not on disease specific treatment at that time. She also had abnormal labs concerning for lupus for years when younger does not recall the exact details.     Labs reviewed 10/26/22 ANA pos RF neg CCP neg ESR wnl CRP wnl Vit D 1,25 wnl   Previous HPI 04/18/22 Robin [name] is a 52 y.o. female here for seronegative RA, previously a patient with Dr. Deanne Coffer. She was taking Humira previously interrupted due to loss of coverage and leflunomide with questionable  response in symptoms. She was diagnosed after visit in 11/2019 with multiple areas of joint pain and frequent swelling particularly in bilateral wrists.  Laboratory work-up at that time including rheumatoid factor, CCP, ACE, HLA-B27, sed rate, and CRP were all negative.  She was started on methotrexate and the leflunomide with lack of response in her symptoms.  She started on Humira for this and took it for a few months but was then off the medication for at least about a year due to change in insurance status after losing her job.  She restarted this since 2 months ago so far has not seen a very impressive change in her symptoms.  She is significantly limited by severe lower back pain with degenerative arthritis pursuing disability for this.  Also has advanced osteoarthritis of bilateral knees.  Shoulder pain limits overhead movement or worsened with pressure.  Gets chronic hand pain worse around the thumbs but especially on the radial side of her wrist with fairly chronic swelling currently worse than normal on the right side.  She has had local steroid joint injections in multiple areas but none in the past year these were typically beneficial for at least weeks in the past.  For her osteoarthritis pain she takes diclofenac twice daily with a pretty good benefit has not experienced any side effects or problems taking this medicine.    DMARD Hx Humira - Current, loss of response? MTX and LEF - inadequate response   Review of Systems  Constitutional:  Positive for fatigue.  HENT:  Positive for mouth sores and mouth dryness.   Eyes:  Positive for dryness.  Respiratory:  Positive for shortness of breath.   Cardiovascular:  Negative for chest pain and palpitations.  Gastrointestinal:  Positive for constipation. Negative for blood in stool and diarrhea.  Endocrine: Negative for increased urination.  Genitourinary:  Negative for involuntary urination.  Musculoskeletal:  Positive for joint pain, gait  problem, joint pain, joint swelling, myalgias, muscle weakness, morning stiffness, muscle tenderness and myalgias.  Skin:  Positive for sensitivity to sunlight. Negative for color change, rash and hair loss.  Allergic/Immunologic: Positive for susceptible to infections.  Neurological:  Negative for dizziness and headaches.  Hematological:  Negative for swollen glands.  Psychiatric/Behavioral:  Positive for sleep disturbance. Negative for depressed mood. The patient is nervous/anxious.     PMFS History:  Patient Active Problem List   Diagnosis Date Noted   Bilateral shoulder pain 04/25/2023   Dysphagia 04/17/2023   Gastroesophageal reflux disease without esophagitis 04/17/2023   Colon cancer screening 04/17/2023   Constipation 04/17/2023   Bilateral carpal tunnel syndrome 03/12/2023   Positive ANA (antinuclear antibody) 12/05/2022   Vitamin D deficiency 11/27/2022   High risk medication use 04/19/2022   Financial insecurity 02/13/2022   Anxiety and depression 01/30/2022  Hypercholesterolemia 01/30/2022   Primary hypertension 01/30/2022   Rheumatoid arthritis of multiple sites with negative rheumatoid factor (HCC) 01/30/2022   Osteoarthritis of left knee 06/01/2021   Lumbar spinal stenosis status post L2-L3 TLIF 05/05/2021   Degenerative scoliosis 04/18/2021   Sarcoidosis 05/10/2020   Pain of left hip joint 03/24/2020   Cervical spondylosis without myelopathy 07/16/2019   Neck pain 07/16/2019   Class 3 drug-induced obesity with body mass index (BMI) of 45.0 to 49.9 in adult [facility]) 10/18/2018   Primary osteoarthritis of both knees 10/18/2018   Hammertoe of right foot 07/02/2018   OSA (obstructive sleep apnea) 10/26/2014    Past Medical History:  Diagnosis Date   Allergy    Anxiety    Asthma    Bilateral carpal tunnel syndrome    Degeneration of lumbar intervertebral disc    Depression    DM (diabetes mellitus) (HCC)    GERD (gastroesophageal reflux disease)     Hyperlipidemia    Hypertension    OSA (obstructive sleep apnea)    Osteoarthritis of both knees    Pneumonia    Rheumatoid arthritis (HCC)    Sarcoidosis    Spinal stenosis    Vitamin D deficiency     Family History  Problem Relation Age of Onset   Colon polyps Mother    Colon cancer Neg Hx    Stomach cancer Neg Hx    Esophageal cancer Neg Hx    Rectal cancer Neg Hx    Past Surgical History:  Procedure Laterality Date   ABDOMINAL HYSTERECTOMY     DEBRIDEMENT AND CLOSURE WOUND N/A 06/15/2021   Procedure: Irrigation and debridement and closure of spine wound;  Surgeon: Venita Lick, MD;  Location: [facility] OR;  Service: Orthopedics;  Laterality: N/A;   ESOPHAGOGASTRODUODENOSCOPY     FOOT SURGERY Bilateral    Both surgeries for bunions by Dr. Wynelle Cleveland per pt   TRANSFORAMINAL LUMBAR INTERBODY FUSION (TLIF) WITH PEDICLE SCREW FIXATION 1 LEVEL N/A 05/05/2021   Procedure: TRANSFORAMINAL LUMBAR INTERBODY FUSION (TLIF) WITH PEDICLE SCREW FIXATION 1 LEVEL L2-3;  Surgeon: Venita Lick, MD;  Location: [facility] OR;  Service: Orthopedics;  Laterality: N/A;  4 hrs   Social History   Social History Narrative   Not on file   Immunization History  Administered Date(s) Administered   Influenza,inj,quad, With Preservative 09/25/2019   Influenza-Unspecified 10/20/2017   MODERNA COVID-19 SARS-COV-2 PEDS BIVALENT BOOSTER 5yr-48yr 01/07/2020, 02/04/2020   Unspecified SARS-COV-2 Vaccination 01/07/2020, 02/04/2020     Objective: Vital Signs: BP (!) 156/84 (BP Location: Left Arm, Patient Position: Sitting, Cuff Size: Normal)   Pulse 80   Resp 16   Ht 5\' 9"  (1.753 m)   Wt (!) 324 lb (147 kg)   BMI 47.85 kg/m    Physical Exam Constitutional:      Appearance: She is obese.  HENT:     Mouth/Throat:     Mouth: Mucous membranes are moist.     Pharynx: Oropharynx is clear.  Eyes:     Conjunctiva/sclera: Conjunctivae normal.  Cardiovascular:     Rate and Rhythm: Normal rate and regular rhythm.      Heart sounds: Murmur heard.  Pulmonary:     Effort: Pulmonary effort is normal.     Breath sounds: Normal breath sounds.  Musculoskeletal:     Right lower leg: No edema.     Left lower leg: No edema.  Lymphadenopathy:     Cervical: No cervical adenopathy.  Skin:    General: Skin is  warm and dry.     Findings: No rash.  Neurological:     Mental Status: She is alert.  Psychiatric:        Mood and Affect: Mood normal.      Musculoskeletal Exam:  Shoulders abduction limited at slightly above horizontal with active range of motion, passive is more preserved Tenderness to pressure around the shoulders and over trapezius muscles bilaterally Elbows full ROM no tenderness or swelling Wrists full ROM no tenderness or swelling Fingers full ROM tenderness to pressure at first [facility] joint of both hands with no palpable synovitis Lateral hip pain is provoked by internal rotation on both sides Knees full ROM joint line tenderness to pressure, patellofemoral crepitus, no palpable effusions   CDAI Exam: CDAI Score: 8  Patient Global: 30 / 100; Provider Global: 10 / 100 Swollen: 0 ; Tender: 6  Joint Exam 07/30/2023      Right  Left  Glenohumeral   Tender   Tender  CMC   Tender   Tender  Knee   Tender   Tender     Investigation: No additional findings.  Imaging: No results found.  Recent Labs: Lab Results  Component Value Date   WBC 13.9 (H) 07/25/2023   HGB 12.6 07/25/2023   PLT 318 07/25/2023   NA 139 07/25/2023   K 3.9 07/25/2023   CL 101 07/25/2023   CO2 27 07/25/2023   GLUCOSE 88 07/25/2023   BUN 15 07/25/2023   CREATININE 0.84 07/25/2023   BILITOT 0.4 07/25/2023   ALKPHOS 96 04/19/2022   AST 20 07/25/2023   ALT 22 07/25/2023   PROT 6.8 07/25/2023   ALBUMIN 3.5 04/19/2022   CALCIUM 9.4 07/25/2023   GFRAA  10/12/2009    >60        The eGFR has been calculated using the MDRD equation. This calculation has not been validated in all clinical situations. eGFR's  persistently <60 mL/min signify possible Chronic Kidney Disease.   QFTBGOLDPLUS NEGATIVE 04/05/2023    Speciality Comments: No specialty comments available.  Procedures:  No procedures performed Allergies: Lisinopril   Assessment / Plan:     Visit Diagnoses: Rheumatoid arthritis of multiple sites with negative rheumatoid factor (HCC) - Plan: Sedimentation rate, C-reactive protein  Symptoms appear improved without peripheral joint synovitis appreciable on exam shoulder knee pain with considerable underlying osteoarthritis.  Will recheck sed rate and CRP for inflammatory activity monitoring.  Plan to continue on Orencia 125 mg subcu weekly.  High risk medication use - Orencia 125 mg subcu weekly  Recent labs checked July 31 blood count with increased white blood count may be reactive otherwise normal and complete metabolic panel normal.  No side effect or issue with the Orencia injections.  Chronic pain of both shoulders - Flexeril 10 mg can take in evening as needed.  Recent intra-articular steroid injection by Dr. Devonne Doughty currently symptoms are improved plan to follow-up possible injection on right side.  Orders: Orders Placed This Encounter  Procedures   Sedimentation rate   C-reactive protein   No orders of the defined types were placed in this encounter.    Follow-Up Instructions: Return in about 3 months (around 10/30/2023) for RA on ABA f/u 3mos.   Fuller Plan, MD  Note - This record has been created using [employer].  Chart creation errors have been sought, but may not always  have been located. Such creation errors do not reflect on  the standard of medical care.

## 2023-07-25 ENCOUNTER — Other Ambulatory Visit: Payer: Self-pay | Admitting: *Deleted

## 2023-07-25 DIAGNOSIS — M0609 Rheumatoid arthritis without rheumatoid factor, multiple sites: Secondary | ICD-10-CM

## 2023-07-25 DIAGNOSIS — Z79899 Other long term (current) drug therapy: Secondary | ICD-10-CM

## 2023-07-25 LAB — CBC WITH DIFFERENTIAL/PLATELET
Absolute Monocytes: 1168 cells/uL — ABNORMAL HIGH (ref 200–950)
Basophils Absolute: 56 cells/uL (ref 0–200)
Basophils Relative: 0.4 %
Eosinophils Absolute: 445 cells/uL (ref 15–500)
Eosinophils Relative: 3.2 %
HCT: 38.9 % (ref 35.0–45.0)
Hemoglobin: 12.6 g/dL (ref 11.7–15.5)
Lymphs Abs: 5185 cells/uL — ABNORMAL HIGH (ref 850–3900)
MCH: 27.5 pg (ref 27.0–33.0)
MCHC: 32.4 g/dL (ref 32.0–36.0)
MCV: 84.7 fL (ref 80.0–100.0)
MPV: 10.6 fL (ref 7.5–12.5)
Monocytes Relative: 8.4 %
Neutro Abs: 7047 cells/uL (ref 1500–7800)
Neutrophils Relative %: 50.7 %
Platelets: 318 10*3/uL (ref 140–400)
RBC: 4.59 10*6/uL (ref 3.80–5.10)
RDW: 11.8 % (ref 11.0–15.0)
Total Lymphocyte: 37.3 %
WBC: 13.9 10*3/uL — ABNORMAL HIGH (ref 3.8–10.8)

## 2023-07-30 ENCOUNTER — Encounter: Payer: Self-pay | Admitting: Internal Medicine

## 2023-07-30 ENCOUNTER — Ambulatory Visit: Payer: Medicaid Other | Attending: Internal Medicine | Admitting: Internal Medicine

## 2023-07-30 VITALS — BP 156/84 | HR 80 | Resp 16 | Ht 69.0 in | Wt 324.0 lb

## 2023-07-30 DIAGNOSIS — G8929 Other chronic pain: Secondary | ICD-10-CM | POA: Diagnosis not present

## 2023-07-30 DIAGNOSIS — Z79899 Other long term (current) drug therapy: Secondary | ICD-10-CM

## 2023-07-30 DIAGNOSIS — M0609 Rheumatoid arthritis without rheumatoid factor, multiple sites: Secondary | ICD-10-CM

## 2023-07-30 DIAGNOSIS — M25511 Pain in right shoulder: Secondary | ICD-10-CM | POA: Diagnosis not present

## 2023-07-30 DIAGNOSIS — M25512 Pain in left shoulder: Secondary | ICD-10-CM

## 2023-07-30 LAB — SEDIMENTATION RATE: Sed Rate: 17 mm/h (ref 0–30)

## 2023-08-02 ENCOUNTER — Other Ambulatory Visit: Payer: Self-pay

## 2023-08-02 ENCOUNTER — Ambulatory Visit (HOSPITAL_BASED_OUTPATIENT_CLINIC_OR_DEPARTMENT_OTHER): Payer: Medicaid Other | Attending: Orthopedic Surgery | Admitting: Physical Therapy

## 2023-08-02 ENCOUNTER — Encounter (HOSPITAL_BASED_OUTPATIENT_CLINIC_OR_DEPARTMENT_OTHER): Payer: Self-pay | Admitting: Physical Therapy

## 2023-08-02 DIAGNOSIS — M6281 Muscle weakness (generalized): Secondary | ICD-10-CM | POA: Diagnosis present

## 2023-08-02 DIAGNOSIS — M545 Low back pain, unspecified: Secondary | ICD-10-CM | POA: Insufficient documentation

## 2023-08-02 NOTE — Therapy (Signed)
OUTPATIENT PHYSICAL THERAPY THORACOLUMBAR EVALUATION   Patient Name: Robin [name] MRN: [id number] DOB:06-01-1971, 52 y.o., female Today's Date: 08/02/2023  END OF SESSION:  PT End of Session - 08/02/23 1455     Visit Number 1    Number of Visits 4    Date for PT Re-Evaluation 10/31/23    Authorization Type [place] Medicaid    PT Start Time 1400    PT Stop Time 1446    PT Time Calculation (min) 46 min    Activity Tolerance Patient tolerated treatment well;Patient limited by pain    Behavior During Therapy WFL for tasks assessed/performed             Past Medical History:  Diagnosis Date   Allergy    Anxiety    Asthma    Bilateral carpal tunnel syndrome    Degeneration of lumbar intervertebral disc    Depression    DM (diabetes mellitus) (HCC)    GERD (gastroesophageal reflux disease)    Hyperlipidemia    Hypertension    OSA (obstructive sleep apnea)    Osteoarthritis of both knees    Pneumonia    Rheumatoid arthritis (HCC)    Sarcoidosis    Spinal stenosis    Vitamin D deficiency    Past Surgical History:  Procedure Laterality Date   ABDOMINAL HYSTERECTOMY     DEBRIDEMENT AND CLOSURE WOUND N/A 06/15/2021   Procedure: Irrigation and debridement and closure of spine wound;  Surgeon: Venita Lick, MD;  Location: [facility] OR;  Service: Orthopedics;  Laterality: N/A;   ESOPHAGOGASTRODUODENOSCOPY     FOOT SURGERY Bilateral    Both surgeries for bunions by Dr. Wynelle Cleveland per pt   TRANSFORAMINAL LUMBAR INTERBODY FUSION (TLIF) WITH PEDICLE SCREW FIXATION 1 LEVEL N/A 05/05/2021   Procedure: TRANSFORAMINAL LUMBAR INTERBODY FUSION (TLIF) WITH PEDICLE SCREW FIXATION 1 LEVEL L2-3;  Surgeon: Venita Lick, MD;  Location: [facility] OR;  Service: Orthopedics;  Laterality: N/A;  4 hrs   Patient Active Problem List   Diagnosis Date Noted   Bilateral shoulder pain 04/25/2023   Dysphagia 04/17/2023   Gastroesophageal reflux disease without esophagitis 04/17/2023   Colon cancer screening  04/17/2023   Constipation 04/17/2023   Bilateral carpal tunnel syndrome 03/12/2023   Positive ANA (antinuclear antibody) 12/05/2022   Vitamin D deficiency 11/27/2022   High risk medication use 04/19/2022   Financial insecurity 02/13/2022   Anxiety and depression 01/30/2022   Hypercholesterolemia 01/30/2022   Primary hypertension 01/30/2022   Rheumatoid arthritis of multiple sites with negative rheumatoid factor (HCC) 01/30/2022   Osteoarthritis of left knee 06/01/2021   Lumbar spinal stenosis status post L2-L3 TLIF 05/05/2021   Degenerative scoliosis 04/18/2021   Sarcoidosis 05/10/2020   Pain of left hip joint 03/24/2020   Cervical spondylosis without myelopathy 07/16/2019   Neck pain 07/16/2019   Class 3 drug-induced obesity with body mass index (BMI) of 45.0 to 49.9 in adult (HCC) 10/18/2018   Primary osteoarthritis of both knees 10/18/2018   Hammertoe of right foot 07/02/2018   OSA (obstructive sleep apnea) 10/26/2014    PCP:  Quita Skye, PA-C       REFERRING PROVIDER:  Venita Lick, MD     REFERRING DIAG: M54.50 (ICD-10-CM) - Low back pain, unspecified   Rationale for Evaluation and Treatment: Rehabilitation  THERAPY DIAG:  Pain, lumbar region  Muscle weakness (generalized)  ONSET DATE: 2+ years   SUBJECTIVE:  SUBJECTIVE STATEMENT:   Pt states that the pain in her back will get up to 8-9/10. She can sit an hour at the most- pressure in LBP or buttocks. Only relief is laying down. She cannot do standing ADL due to the pain. Pt has NT into bilat LE with L LE into the toes. NT started with first surgery and has slowly gotten worse. Pt does endorse sudden leg weakness that seems to be from her back. She also has significant knee pain which may cause balance issues. She cannot  sit upright due to the pain. She has to eat standing and leaning over. Sleeps prone with pillow under hips. Pt always has to use a cane to walk to ease her back pain. Pt states she was referred for aquatic therapy. Pt does have night pain into the hips. Pt has most difficulty with chores and baseline housework.   Pt is currently at [facility] exercising in the pool but is not sure of what to do. She is just there to move and burn calories but would like guidance on specific LBP exercise     PERTINENT HISTORY:  bilateral osteoarthritis of the knees   Rheumatoid arthritis, obesity, hypertension, chronic pain, sarcoidosis, and previous fusion L2-3 with adjacent degenerative disease L3-S1   two epidural injections at [facility] this year, with the last one being a left L5 nerve block on 04/30/2023  Depression, DM, anxiety,   Are you having pain? Yes: NPRS scale: 5/10 Pain location: L/S, buttocks Pain description: deep throbbing Aggravating factors: standing, walking, ADL, lifting, carrying,  Relieving factors: bending fwd, laying down,   PRECAUTIONS: None   WEIGHT BEARING RESTRICTIONS: No  FALLS:  Has patient fallen in last 6 months? Yes. Number of falls 2, 1- unknown cause and went down, 1- loss of balance, knee gave way   LIVING ENVIRONMENT: Lives with: lives alone Lives in: House/apartment Stairs: Yes Has following equipment at home: Single point cane  OCCUPATION: not currently  PLOF: Independent with basic ADLs  PATIENT GOALS: be able to walk the grocery store and return to walking for exercise    OBJECTIVE:   DIAGNOSTIC FINDINGS:  N/A in Epic  PATIENT SURVEYS:  Oswestry Score: 38 / 50 or 76 %  SCREENING FOR RED FLAGS: Bowel or bladder incontinence: No Spinal tumors: No Cauda equina syndrome: No Compression fracture: No   COGNITION: Overall cognitive status: Within functional limits for tasks assessed     SENSATION: Light touch: Impaired   POSTURE:  increased lumbar lordosis and flexed trunk   LUMBAR ROM: limited by body habitus  AROM eval  Flexion 75%  Extension 0% p! Down legs  Right lateral flexion 50%  Left lateral flexion 50% p!  Right rotation 50% p!  Left rotation 50%   (Blank rows = not tested)  LOWER EXTREMITY ROM:   Limited throughout bilat LE due to pain and body habitus; more limited L hip ROM due to L hip joint pain  LOWER EXTREMITY MMT:  unable to test due to increase in pain from AROM testing  LUMBAR SPECIAL TESTS:  Straight leg raise test: Positive and Slump test: Positive  Unable to perform Tandem or SLS; painful and requires consistent UE contact   FUNCTIONAL TESTS:  5 times sit to stand: 44.1 s Intermittent UE use  GAIT: Distance walked: 87ft Assistive device utilized: Single point cane Level of assistance: Modified independence Comments: compensated Trendelenburg; R side lean  TODAY'S TREATMENT:  DATE: 8/8  Unable to lay supine  Exercises  Walking program in pool environment  - Seated Lumbar Flexion Stretch  - 2-3 x daily - 7 x weekly - 1 sets - 10 reps - 3 hold - Seated Sciatic Nerve Glide  - 2-3 x daily - 7 x weekly - 1 sets - 10 reps - 2 hold    PATIENT EDUCATION:  Education details: MOI, diagnosis, prognosis, anatomy, exercise progression, DOMS expectations, muscle firing,  envelope of function, HEP, POC  Person educated: Patient Education method: Explanation, Demonstration, Tactile cues, Verbal cues, and Handouts Education comprehension: verbalized understanding, returned demonstration, verbal cues required, and tactile cues required  HOME EXERCISE PROGRAM:  Access Code: [email or web address] URL: [email or web address] Date: 08/02/2023 Prepared by: Zebedee Iba  ASSESSMENT:  CLINICAL IMPRESSION: Patient is a 52 y.o. female who was seen today for physical  therapy evaluation and treatment for c/c of LBP. Pt's s/s appear consistent with significant lumbar radiculopathy. Pt's pain is highly sensitive and irritable with movement. Pt's known knee and hip OA also greatly affects her functional mobility. Plan to continue with aquatic therapy and lumbopelvic strength at future sessions. Pt is greatly limited with ADL, ambulation, and general mobility due to her current level of multifactorial pain. Pt would benefit from continued skilled therapy in order to reach goals and maximize functional lumbar and LE strength and ROM for prevention of functional decline.    OBJECTIVE IMPAIRMENTS: Abnormal gait, decreased activity tolerance, decreased balance, decreased mobility, difficulty walking, decreased ROM, decreased strength, hypomobility, increased muscle spasms, impaired sensation, improper body mechanics, postural dysfunction, obesity, and pain.   ACTIVITY LIMITATIONS: carrying, lifting, sitting, standing, squatting, sleeping, stairs, transfers, and locomotion level  PARTICIPATION LIMITATIONS: meal prep, cleaning, laundry, interpersonal relationship, driving, shopping, community activity, yard work, and exercise  PERSONAL FACTORS: Age, Fitness, Past/current experiences, Social background, Time since onset of injury/illness/exacerbation, and 3+ comorbidities:    are also affecting patient's functional outcome.   REHAB POTENTIAL: Fair    CLINICAL DECISION MAKING: Evolving/moderate complexity  EVALUATION COMPLEXITY: Moderate   GOALS:   Short and Long-Term GOALS: Target date: 10/25/2023   Pt  will become independent with HEP in order to demonstrate synthesis of PT education.  Baseline: Goal status: INITIAL  2.  Pt will report at least 2 pt reduction on NPRS scale for pain in order to demonstrate functional improvement with household activity, self care, and ADL.  Baseline:  Goal status: INITIAL   3.  Pt will be able to demonstrate/report ability  to sit/stand/sleep for extended periods of time without pain in order to demonstrate functional improvement and tolerance to static positioning.  Baseline:  Goal status: INITIAL  4.  Pt will be able to demonstrate/report ability to walk >10 mins without pain in order to demonstrate functional improvement and tolerance to exercise and community mobility.  Baseline:  Goal status: INITIAL  5.  Pt will demonstrate at least a 12.8 improvement in Oswestry Index in order to demonstrate a clinically significant change in LBP and function.  Baseline:  Goal status: INITIAL   PLAN:  PT FREQUENCY: 1x/week  PT DURATION: 4 weeks (re-cert for more visits in 4)  PLANNED INTERVENTIONS: Therapeutic exercises, Therapeutic activity, Neuromuscular re-education, Balance training, Gait training, Patient/Family education, Self Care, Joint mobilization, Joint manipulation, Stair training, Orthotic/Fit training, DME instructions, Aquatic Therapy, Dry Needling, Electrical stimulation, Spinal manipulation, Spinal mobilization, Cryotherapy, Moist heat, scar mobilization, Splintting, Taping, Vasopneumatic device, Traction, Ultrasound, Ionotophoresis 4mg /ml Dexamethasone, Manual therapy, and Re-evaluation.  PLAN FOR NEXT  SESSION: aquatic intro: create aquatic HEP- pt has [employer] and is currently using pool    Zebedee Iba, PT 08/02/2023, 3:09 PM   Check all possible CPT codes: 56213 - PT Re-evaluation, 97110- Therapeutic Exercise, [phone]- Neuro Re-education, [phone] - Gait Training, [phone] - Manual Therapy, [phone] - Therapeutic Activities, [id number] - Electrical stimulation (unattended), [phone] - Electrical stimulation (Manual), [id number] - Iontophoresis, 97035 - Ultrasound, [phone] - Orthotic Fit, and [id number] - Aquatic therapy    Check all conditions that are expected to impact treatment: {Conditions expected to impact treatment:Morbid obesity, Diabetes mellitus, Musculoskeletal disorders, Social determinants of health, and  Complications related to surgery   If treatment provided at initial evaluation, no treatment charged due to lack of authorization.

## 2023-08-03 ENCOUNTER — Other Ambulatory Visit: Payer: Self-pay | Admitting: Internal Medicine

## 2023-08-03 ENCOUNTER — Other Ambulatory Visit: Payer: Self-pay

## 2023-08-03 ENCOUNTER — Ambulatory Visit: Payer: Medicaid Other | Attending: Internal Medicine | Admitting: Internal Medicine

## 2023-08-03 VITALS — BP 132/84 | HR 73 | Ht 70.0 in | Wt 325.2 lb

## 2023-08-03 DIAGNOSIS — Z79899 Other long term (current) drug therapy: Secondary | ICD-10-CM

## 2023-08-03 DIAGNOSIS — M0609 Rheumatoid arthritis without rheumatoid factor, multiple sites: Secondary | ICD-10-CM

## 2023-08-03 DIAGNOSIS — I10 Essential (primary) hypertension: Secondary | ICD-10-CM

## 2023-08-03 NOTE — Telephone Encounter (Signed)
Last Fill: 07/08/2023  Labs: 07/25/2023 Lymphs Abs 5,185, Absolute Monocytes 1,168, WBC 13.9  TB Gold: 04/07/2023   Next Visit: 11/01/2023  Last Visit: 07/30/2023  [id number] arthritis of multiple sites with negative rheumatoid factor   Current Dose per office note 07/30/2023: Orencia 125 mg subcu weekly   Okay to refill Orencia?

## 2023-08-03 NOTE — Progress Notes (Signed)
Cardiology Office Note:    Date:  08/03/2023   ID:  Robin [name], DOB 15-Nov-1971, MRN [id number]  PCP:  Quita Skye, PA-C   [facility] Providers Cardiologist:  Maisie Fus, MD     Referring MD: Quita Skye, PA-C   No chief complaint on file. HTN  History of Present Illness:    Robin [name] is a 52 y.o. female with a hx of HTN, ANA + RA on adalimumab referral for HTN  Notes 140/90 mmHg at home. She has a blood pressure cuff at home.  She had a sleep study in the past, prescribed a CPAP.  She lost her insurance when the company she worked for went under  The 10-year ASCVD risk score (Arnett DK, et al., 2019) is: 4.6%   Values used to calculate the score:     Age: 68 years     Sex: Female     Is Non-Hispanic African American: Yes     Diabetic: No     Tobacco smoker: No     Systolic Blood Pressure: 132 mmHg     Is BP treated: Yes     HDL Cholesterol: 56 mg/dL     Total Cholesterol: 182 mg/dL   Interim hx 4/0/3474 She is going to [employer] she is planned to do an aquatic class. Blood pressure is 130s-140s. Her amlodipine switched to at night. She is planned to see pulmonary medicine for her cpap. She is doing well  Past Medical History:  Diagnosis Date   Allergy    Anxiety    Asthma    Bilateral carpal tunnel syndrome    Degeneration of lumbar intervertebral disc    Depression    DM (diabetes mellitus) (HCC)    GERD (gastroesophageal reflux disease)    Hyperlipidemia    Hypertension    OSA (obstructive sleep apnea)    Osteoarthritis of both knees    Pneumonia    Rheumatoid arthritis (HCC)    Sarcoidosis    Spinal stenosis    Vitamin D deficiency     Past Surgical History:  Procedure Laterality Date   ABDOMINAL HYSTERECTOMY     DEBRIDEMENT AND CLOSURE WOUND N/A 06/15/2021   Procedure: Irrigation and debridement and closure of spine wound;  Surgeon: Venita Lick, MD;  Location: [facility] OR;  Service: Orthopedics;  Laterality:  N/A;   ESOPHAGOGASTRODUODENOSCOPY     FOOT SURGERY Bilateral    Both surgeries for bunions by Dr. Wynelle Cleveland per pt   TRANSFORAMINAL LUMBAR INTERBODY FUSION (TLIF) WITH PEDICLE SCREW FIXATION 1 LEVEL N/A 05/05/2021   Procedure: TRANSFORAMINAL LUMBAR INTERBODY FUSION (TLIF) WITH PEDICLE SCREW FIXATION 1 LEVEL L2-3;  Surgeon: Venita Lick, MD;  Location: [facility] OR;  Service: Orthopedics;  Laterality: N/A;  4 hrs    Current Medications: Current Outpatient Medications on File Prior to Visit  Medication Sig Dispense Refill   Abatacept (ORENCIA CLICKJECT) 125 MG/ML SOAJ Inject 125 mg into the skin once a week. 4 mL 0   albuterol (VENTOLIN HFA) 108 (90 Base) MCG/ACT inhaler Inhale 2 puffs into the lungs every 6 (six) hours as needed for wheezing or shortness of breath. 18 g 2   amLODipine (NORVASC) 10 MG tablet Take 1 tablet (10 mg total) by mouth daily. 30 tablet 11   atorvastatin (LIPITOR) 20 MG tablet Take 1 tablet (20 mg total) by mouth daily. 30 tablet 11   buPROPion (WELLBUTRIN SR) 150 MG 12 hr tablet Take 1 tablet (150 mg total) by mouth  2 (two) times daily. 60 tablet 11   buPROPion (WELLBUTRIN SR) 200 MG 12 hr tablet Take one tablet every morning with breakfast.  Take half  a tablet every evening     carvedilol (COREG) 12.5 MG tablet Take 1 tablet (12.5 mg total) by mouth 2 (two) times daily with a meal. 60 tablet 5   cetirizine (ZYRTEC) 10 MG tablet Take 1 tablet by mouth daily.     cyclobenzaprine (FLEXERIL) 10 MG tablet Take 1 tablet (10 mg total) by mouth at bedtime as needed. 30 tablet 2   diclofenac Sodium (VOLTAREN) 1 % GEL Apply topically 4 (four) times daily.     fluticasone (FLONASE) 50 MCG/ACT nasal spray Place into the nose.     losartan-hydrochlorothiazide (HYZAAR) 100-25 MG tablet Take 1 tablet by mouth daily.     Melatonin 10 MG CAPS Take 20 mg by mouth at bedtime as needed (sleep).     pantoprazole (PROTONIX) 40 MG tablet Take 1 tablet (40 mg total) by mouth daily. 30 tablet 5    traMADol (ULTRAM) 50 MG tablet Take 1 tablet (50 mg total) by mouth 3 (three) times daily as needed. 90 tablet 0   escitalopram (LEXAPRO) 20 MG tablet Take by mouth.     No current facility-administered medications on file prior to visit.     Allergies:   Lisinopril   Social History   Socioeconomic History   Marital status: Divorced    Spouse name: Not on file   Number of children: 1   Years of education: Not on file   Highest education level: 12th grade  Occupational History   Occupation: payroll  Tobacco Use   Smoking status: Former    Current packs/day: 0.00    Average packs/day: 0.1 packs/day for 2.0 years (0.2 ttl pk-yrs)    Types: Cigarettes    Start date: 12/25/1994    Quit date: 12/25/1996    Years since quitting: 26.6    Passive exposure: Never   Smokeless tobacco: Never   Tobacco comments:    smoked 2-3 cigs daily x 2 years  Vaping Use   Vaping status: Never Used  Substance and Sexual Activity   Alcohol use: Yes    Alcohol/week: 0.0 standard drinks of alcohol    Comment: rarely   Drug use: No   Sexual activity: Not on file  Other Topics Concern   Not on file  Social History Narrative   Not on file   Social Determinants of Health   Financial Resource Strain: High Risk (04/23/2023)   Overall Financial Resource Strain (CARDIA)    Difficulty of Paying Living Expenses: Very hard  Food Insecurity: Not at Risk (05/23/2023)   Received from [place], [place]   Food Insecurity    Food: 1  Transportation Needs: Not at Risk (05/23/2023)   Received from [place], [employer] Needs    Transportation: 1  Physical Activity: Insufficiently Active (04/23/2023)   Exercise Vital Sign    Days of Exercise per Week: 2 days    Minutes of Exercise per Session: 60 min  Stress: Stress Concern Present (04/23/2023)   [employer] of Occupational Health - Occupational Stress Questionnaire    Feeling of Stress : Very much  Social Connections: Socially Isolated (04/23/2023)    Social Connection and Isolation Panel [NHANES]    Frequency of Communication with Friends and Family: Once a week    Frequency of Social Gatherings with Friends and Family: Never    Attends Religious Services:  Never    Active Member of Clubs or Organizations: No    Attends Banker Meetings: Not on file    Marital Status: Divorced     Family History: Mother- CHF Father- HTN  ROS:   Please see the history of present illness.      All other systems reviewed and are negative.  EKGs/Labs/Other Studies Reviewed:    The following studies were reviewed today:   EKG:  EKG is  ordered today.  The ekg ordered today demonstrates   01/31/2023- NSR  Recent Labs: 07/25/2023: ALT 22; BUN 15; Creat 0.84; Hemoglobin 12.6; Platelets 318; Potassium 3.9; Sodium 139   Recent Lipid Panel    Component Value Date/Time   CHOL 175 01/10/2023 1450   TRIG 221 (H) 01/10/2023 1450   HDL 49 (L) 01/10/2023 1450   CHOLHDL 3.6 01/10/2023 1450   LDLCALC 95 01/10/2023 1450     Risk Assessment/Calculations:         Physical Exam:    VS:   Vitals:   08/03/23 1325  BP: 132/84  Pulse: 73  SpO2: 97%     Wt Readings from Last 3 Encounters:  08/03/23 (!) 325 lb 3.2 oz (147.5 kg)  07/30/23 (!) 324 lb (147 kg)  06/12/23 (!) 330 lb (149.7 kg)     GEN:  Well nourished, well developed in no acute distress HEENT: Normal NECK: No JVD; No carotid bruits LYMPHATICS: No lymphadenopathy CARDIAC: RRR, no murmurs, rubs, gallops RESPIRATORY:  Clear to auscultation without rales, wheezing or rhonchi  ABDOMEN: Soft, non-tender, non-distended MUSCULOSKELETAL:  No edema; No deformity  SKIN: Warm and dry NEUROLOGIC:  Alert and oriented x 3 PSYCHIATRIC:  Normal affect   ASSESSMENT:    HTN:   - better control, CPAP should help -cont coreg to 12.5 mg BID -losartan-Hctz: 100 mg-25 mg daily -continue norvasc 10 mg daily  Obesity: ?GLP1 / gastric bypass. Sent a referral to healthy weight and  wellness  OSA: working on CPAP  Intermediate ASCVD risk -continue lipitor 20 mg daily  PLAN:    In order of problems listed above:   No changes Follow up 6 months     Medication Adjustments/Labs and Tests Ordered: Current medicines are reviewed at length with the patient today.  Concerns regarding medicines are outlined above.  No orders of the defined types were placed in this encounter.  No orders of the defined types were placed in this encounter.   Patient Instructions  Medication Instructions:  Your physician recommends that you continue on your current medications as directed. Please refer to the Current Medication list given to you today.  *If you need a refill on your cardiac medications before your next appointment, please call your pharmacy*   Follow-Up: At [facility], you and your health needs are our priority.  As part of our continuing mission to provide you with exceptional heart care, we have created designated Provider Care Teams.  These Care Teams include your primary Cardiologist (physician) and Advanced Practice Providers (APPs -  Physician Assistants and Nurse Practitioners) who all work together to provide you with the care you need, when you need it.  We recommend signing up for the patient portal called "MyChart".  Sign up information is provided on this After Visit Summary.  MyChart is used to connect with patients for Virtual Visits (Telemedicine).  Patients are able to view lab/test results, encounter notes, upcoming appointments, etc.  Non-urgent messages can be sent to your provider as well.  To learn more about what you can do with MyChart, go to [email or web address].    Your next appointment:   6 month(s)  Provider:   Maisie Fus, MD     Other Instructions  Check bp 3x a week if it is consistently 130/80 or high contact the office     Signed, Maisie Fus, MD  08/03/2023 2:14 PM    [facility]

## 2023-08-03 NOTE — Patient Instructions (Signed)
Medication Instructions:  Your physician recommends that you continue on your current medications as directed. Please refer to the Current Medication list given to you today.  *If you need a refill on your cardiac medications before your next appointment, please call your pharmacy*   Follow-Up: At [facility], you and your health needs are our priority.  As part of our continuing mission to provide you with exceptional heart care, we have created designated Provider Care Teams.  These Care Teams include your primary Cardiologist (physician) and Advanced Practice Providers (APPs -  Physician Assistants and Nurse Practitioners) who all work together to provide you with the care you need, when you need it.  We recommend signing up for the patient portal called "MyChart".  Sign up information is provided on this After Visit Summary.  MyChart is used to connect with patients for Virtual Visits (Telemedicine).  Patients are able to view lab/test results, encounter notes, upcoming appointments, etc.  Non-urgent messages can be sent to your provider as well.   To learn more about what you can do with MyChart, go to [email or web address].    Your next appointment:   6 month(s)  Provider:   Maisie Fus, MD     Other Instructions  Check bp 3x a week if it is consistently 130/80 or high contact the office

## 2023-08-04 ENCOUNTER — Other Ambulatory Visit (HOSPITAL_COMMUNITY): Payer: Self-pay

## 2023-08-06 ENCOUNTER — Other Ambulatory Visit: Payer: Self-pay | Admitting: Internal Medicine

## 2023-08-06 ENCOUNTER — Other Ambulatory Visit (HOSPITAL_COMMUNITY): Payer: Self-pay

## 2023-08-06 DIAGNOSIS — Z79899 Other long term (current) drug therapy: Secondary | ICD-10-CM

## 2023-08-06 DIAGNOSIS — M0609 Rheumatoid arthritis without rheumatoid factor, multiple sites: Secondary | ICD-10-CM

## 2023-08-06 NOTE — Telephone Encounter (Signed)
Last Fill: 07/10/2023  Labs: 07/25/2023 WBC 13.9, Lymphs Abs 5,185, Absolute Monocytes 1, 168  TB Gold: 04/05/2023 Neg    Next Visit: 11/01/2023  Last Visit: 07/30/2023  [id number] arthritis of multiple sites with negative rheumatoid factor   Current Dose per office note 07/30/2023: Orencia 125 mg subcu weekly   Okay to refill Orencia?

## 2023-08-07 ENCOUNTER — Ambulatory Visit: Payer: Medicaid Other | Admitting: Nurse Practitioner

## 2023-08-07 ENCOUNTER — Encounter: Payer: Self-pay | Admitting: Nurse Practitioner

## 2023-08-07 VITALS — BP 132/74 | HR 91 | Ht 69.0 in | Wt 323.8 lb

## 2023-08-07 DIAGNOSIS — Z6841 Body Mass Index (BMI) 40.0 and over, adult: Secondary | ICD-10-CM

## 2023-08-07 DIAGNOSIS — E661 Drug-induced obesity: Secondary | ICD-10-CM | POA: Diagnosis not present

## 2023-08-07 DIAGNOSIS — G4733 Obstructive sleep apnea (adult) (pediatric): Secondary | ICD-10-CM | POA: Diagnosis not present

## 2023-08-07 MED ORDER — ORENCIA CLICKJECT 125 MG/ML ~~LOC~~ SOAJ
125.0000 mg | SUBCUTANEOUS | 0 refills | Status: DC
Start: 2023-08-07 — End: 2023-11-01
  Filled 2023-08-07: qty 4, 28d supply, fill #0
  Filled 2023-08-31: qty 4, 28d supply, fill #1

## 2023-08-07 NOTE — Assessment & Plan Note (Signed)
Severe OSA with AHI 30.7/h. Reviewed risks of untreated OSA and potential treatment options. Optimal pressure during study was 14 cmH2O. She is down 7 lb and on weight loss journey. Will start her on auto CPAP 12-16 cmH2O. Orders placed today. Educated on proper care/use of device. Risks/benefits reviewed. Will need close monitoring of pressure settings/leakage with ongoing weight loss. Aware of safe driving practices.  Patient Instructions  Start CPAP 12-16 cmH2O, Large size [employer] Full Face Evora Full mask, heated humidification, every night, minimum of 4-6 hours a night.  Change equipment as directed. Wash your tubing with warm soap and water daily, hang to dry. Wash humidifier portion weekly. Use bottled distilled and change daily  Be aware of reduced alertness and do not drive or operate heavy machinery if experiencing this or drowsiness.  Exercise encouraged, as tolerated. Healthy weight management discussed.  Notify if persistent daytime sleepiness occurs even with consistent use of CPAP.  We discussed how untreated sleep apnea puts an individual at risk for cardiac arrhthymias, pulm HTN, DM, stroke and increases their risk for daytime accidents. We also briefly reviewed treatment options including weight loss, side sleeping position, oral appliance, CPAP therapy or referral to ENT for possible surgical options  Follow up in 12 weeks with Dr. Wynona Neat or Philis Nettle, or sooner, if needed

## 2023-08-07 NOTE — Patient Instructions (Addendum)
Start CPAP 12-16 cmH2O, Large size [employer] Full Face Evora Full mask, heated humidification, every night, minimum of 4-6 hours a night.  Change equipment as directed. Wash your tubing with warm soap and water daily, hang to dry. Wash humidifier portion weekly. Use bottled distilled and change daily  Be aware of reduced alertness and do not drive or operate heavy machinery if experiencing this or drowsiness.  Exercise encouraged, as tolerated. Healthy weight management discussed.  Notify if persistent daytime sleepiness occurs even with consistent use of CPAP.  We discussed how untreated sleep apnea puts an individual at risk for cardiac arrhthymias, pulm HTN, DM, stroke and increases their risk for daytime accidents. We also briefly reviewed treatment options including weight loss, side sleeping position, oral appliance, CPAP therapy or referral to ENT for possible surgical options  Follow up in 12 weeks with Dr. Wynona Neat or Philis Nettle, or sooner, if needed

## 2023-08-07 NOTE — Assessment & Plan Note (Signed)
BMI 47. Healthy weight loss encouraged. Awaiting bariatric surgery

## 2023-08-07 NOTE — Progress Notes (Signed)
@Patient  ID: Robin [name], female    DOB: [birth year], 52 y.o.   MRN: [id number]  Chief Complaint  Patient presents with   Follow-up    Split night     Referring provider: Quita Skye, PA-C  HPI: 52 year old female, former remote smoker followed for severe OSA. Past medical history significant for HTN, OSA not on CPAP, RA, anxiety, depression, vit d deficiency.   TEST/EVENTS:  2010 NPSG: AHI 8/h 06/12/2023 Split Night Study: AHI 30.7/h, SpO2 low 88% >> optimal pressure 14 cmH2O; weight 330 lb  02/15/2023: Ov with  NP for sleep consult, referred by Dr. Wyline Mood.  She was previously seen in our office by Dr. Stann Mainland in 2015.  She had mild obstructive sleep apnea diagnosed in 2010.  She was started on CPAP therapy and had been on it for many years.  At some point, she lost her insurance and stopped receiving supplies so she stopped wearing the CPAP a few years ago.  She has had a lot of trouble with her sleep, especially over the last year.  She tells me that she wakes up often throughout the night.  Feels anxious when she does.  She has loud snoring.  She feels very tired throughout the day.  Wakes with a dry mouth most mornings.  Denies any morning headaches, drowsy driving, sleep parasomnia/paralysis.  No history of narcolepsy or cataplexy.  No witnessed apneas She goes to bed between 8 PM to 10 PM.  Usually takes a long time to fall asleep, around 4 to 5 hours.  Wakes 3 or more times a night.  Gets up for the day between 7 and 11 AM.  She is currently on disability.  Gained 30 pounds over the last 2 years.  Does not take any sleep aids. She has a history of high blood pressure, which has been difficult to control recently.  That is another reason why she was encouraged to come back for sleep evaluation.  She also has asthma, high cholesterol and allergies. She was a former remote smoker, quit in 1998.  Drinks alcohol occasionally, usually 1 glass a month.  Drinks 1 to 2 cups of  coffee a day.  Lives alone.  Family history of asthma, heart disease, rheumatism and cancer.   Epworth 16  08/07/2023: Today - follow up Patient presents today for follow up after split night study which revealed severe sleep apnea. She feels unchanged compared to our last visit. Continues to have a lot of trouble with her sleep. Wakes often throughout the night. Feels tired during the day. She denies any morning headaches, drowsy driving, sleep parasomnias/paralysis. She is preparing to have weight loss surgery. They were wanting her seen and started on CPAP therapy prior to this.   Allergies  Allergen Reactions   Lisinopril Cough    Immunization History  Administered Date(s) Administered   Influenza,inj,quad, With Preservative 09/25/2019   Influenza-Unspecified 10/20/2017   MODERNA COVID-19 SARS-COV-2 PEDS BIVALENT BOOSTER 35yr-96yr 01/07/2020, 02/04/2020   Unspecified SARS-COV-2 Vaccination 01/07/2020, 02/04/2020    Past Medical History:  Diagnosis Date   Allergy    Anxiety    Asthma    Bilateral carpal tunnel syndrome    Degeneration of lumbar intervertebral disc    Depression    DM (diabetes mellitus) (HCC)    GERD (gastroesophageal reflux disease)    Hyperlipidemia    Hypertension    OSA (obstructive sleep apnea)    Osteoarthritis of both knees    Pneumonia  Rheumatoid arthritis (HCC)    Sarcoidosis    Spinal stenosis    Vitamin D deficiency     Tobacco History: Social History   Tobacco Use  Smoking Status Former   Current packs/day: 0.00   Average packs/day: 0.1 packs/day for 2.0 years (0.2 ttl pk-yrs)   Types: Cigarettes   Start date: 12/25/1994   Quit date: 12/25/1996   Years since quitting: 26.6   Passive exposure: Never  Smokeless Tobacco Never  Tobacco Comments   smoked 2-3 cigs daily x 2 years   Counseling given: Not Answered Tobacco comments: smoked 2-3 cigs daily x 2 years   Outpatient Medications Prior to Visit  Medication Sig Dispense Refill    Abatacept (ORENCIA CLICKJECT) 125 MG/ML SOAJ Inject 125 mg into the skin once a week. 4 mL 0   albuterol (VENTOLIN HFA) 108 (90 Base) MCG/ACT inhaler Inhale 2 puffs into the lungs every 6 (six) hours as needed for wheezing or shortness of breath. 18 g 2   amLODipine (NORVASC) 10 MG tablet Take 1 tablet (10 mg total) by mouth daily. 30 tablet 11   atorvastatin (LIPITOR) 20 MG tablet Take 1 tablet (20 mg total) by mouth daily. 30 tablet 11   buPROPion (WELLBUTRIN SR) 150 MG 12 hr tablet Take 1 tablet (150 mg total) by mouth 2 (two) times daily. 60 tablet 11   buPROPion (WELLBUTRIN SR) 200 MG 12 hr tablet Take one tablet every morning with breakfast.  Take half  a tablet every evening     carvedilol (COREG) 12.5 MG tablet Take 1 tablet (12.5 mg total) by mouth 2 (two) times daily with a meal. 60 tablet 5   cetirizine (ZYRTEC) 10 MG tablet Take 1 tablet by mouth daily.     cyclobenzaprine (FLEXERIL) 10 MG tablet Take 1 tablet (10 mg total) by mouth at bedtime as needed. 30 tablet 2   diclofenac Sodium (VOLTAREN) 1 % GEL Apply topically 4 (four) times daily.     fluticasone (FLONASE) 50 MCG/ACT nasal spray Place into the nose.     losartan-hydrochlorothiazide (HYZAAR) 100-25 MG tablet Take 1 tablet by mouth daily.     Melatonin 10 MG CAPS Take 20 mg by mouth at bedtime as needed (sleep).     pantoprazole (PROTONIX) 40 MG tablet Take 1 tablet (40 mg total) by mouth daily. 30 tablet 5   traMADol (ULTRAM) 50 MG tablet Take 1 tablet (50 mg total) by mouth 3 (three) times daily as needed. 90 tablet 0   escitalopram (LEXAPRO) 20 MG tablet Take by mouth.     No facility-administered medications prior to visit.     Review of Systems:   Constitutional: No night sweats, fevers, chills, or lassitude. +excessive daytime fatigue, weight gain HEENT: No headaches, difficulty swallowing, tooth/dental problems, or sore throat. No sneezing, itching, ear ache. +nasal congestion CV:  No chest pain, orthopnea,  PND, swelling in lower extremities, anasarca, dizziness, palpitations, syncope Resp: +snoring; shortness of breath with exertion (baseline); occasional cough (baseline). No excess mucus or change in color of mucus. No productive or non-productive. No hemoptysis. No wheezing.  No chest wall deformity GI:  +heartburn, indigestion. No abdominal pain, nausea, vomiting, diarrhea, change in bowel habits, loss of appetite, bloody stools.  GU: No dysuria, change in color of urine, urgency or frequency.  No flank pain, no hematuria  Skin: No rash, lesions, ulcerations MSK:  No joint pain or swelling.  +chronic back pain Neuro: No dizziness or lightheadedness.  Psych: +depression, anxiety;  sleep disturbance. No SI/HI. Mood stable.     Physical Exam:  BP 132/74   Pulse 91   Ht 5\' 9"  (1.753 m)   Wt (!) 323 lb 12.8 oz (146.9 kg)   SpO2 97%   BMI 47.82 kg/m   GEN: Pleasant, interactive, well-appearing; obese; in no acute distress. HEENT:  Normocephalic and atraumatic. PERRLA. Sclera white. Nasal turbinates pink, moist and patent bilaterally. No rhinorrhea present. Oropharynx pink and moist, without exudate or edema. No lesions, ulcerations, or postnasal drip. Mallampati III NECK:  Supple w/ fair ROM. No JVD present. Normal carotid impulses w/o bruits. Thyroid symmetrical with no goiter or nodules palpated. No lymphadenopathy.   CV: RRR, no m/r/g, no peripheral edema. Pulses intact, +2 bilaterally. No cyanosis, pallor or clubbing. PULMONARY:  Unlabored, regular breathing. Clear bilaterally A&P w/o wheezes/rales/rhonchi. No accessory muscle use.  GI: BS present and normoactive. Soft, non-tender to palpation. No organomegaly or masses detected.  MSK: No erythema, warmth or tenderness. Cap refil <2 sec all extrem. No deformities or joint swelling noted.  Neuro: A/Ox3. No focal deficits noted.   Skin: Warm, no lesions or rashe Psych: Normal affect and behavior. Judgement and thought content appropriate.      Lab Results:  CBC    Component Value Date/Time   WBC 13.9 (H) 07/25/2023 1514   RBC 4.59 07/25/2023 1514   HGB 12.6 07/25/2023 1514   HGB 13.5 01/30/2022 1637   HCT 38.9 07/25/2023 1514   HCT 42.7 01/30/2022 1637   PLT 318 07/25/2023 1514   PLT 312 01/30/2022 1637   MCV 84.7 07/25/2023 1514   MCV 85 01/30/2022 1637   MCH 27.5 07/25/2023 1514   MCHC 32.4 07/25/2023 1514   RDW 11.8 07/25/2023 1514   RDW 11.8 01/30/2022 1637   LYMPHSABS 5,185 (H) 07/25/2023 1514   LYMPHSABS 4.2 (H) 01/30/2022 1637   MONOABS 1.5 (H) 04/19/2022 1015   EOSABS 445 07/25/2023 1514   EOSABS 0.3 01/30/2022 1637   BASOSABS 56 07/25/2023 1514   BASOSABS 0.0 01/30/2022 1637    BMET    Component Value Date/Time   NA 139 07/25/2023 1514   NA 143 04/04/2022 1426   K 3.9 07/25/2023 1514   CL 101 07/25/2023 1514   CO2 27 07/25/2023 1514   GLUCOSE 88 07/25/2023 1514   BUN 15 07/25/2023 1514   BUN 15 04/04/2022 1426   CREATININE 0.84 07/25/2023 1514   CALCIUM 9.4 07/25/2023 1514   GFRNONAA >60 04/19/2022 1015   GFRAA  10/12/2009 0600    >60        The eGFR has been calculated using the MDRD equation. This calculation has not been validated in all clinical situations. eGFR's persistently <60 mL/min signify possible Chronic Kidney Disease.    BNP No results found for: "BNP"   Imaging:  No results found.  Administration History     None           No data to display          No results found for: "NITRICOXIDE"      Assessment & Plan:   OSA (obstructive sleep apnea) Severe OSA with AHI 30.7/h. Reviewed risks of untreated OSA and potential treatment options. Optimal pressure during study was 14 cmH2O. She is down 7 lb and on weight loss journey. Will start her on auto CPAP 12-16 cmH2O. Orders placed today. Educated on proper care/use of device. Risks/benefits reviewed. Will need close monitoring of pressure settings/leakage with ongoing weight loss. Aware of  safe  driving practices.  Patient Instructions  Start CPAP 12-16 cmH2O, Large size [employer] Full Face Evora Full mask, heated humidification, every night, minimum of 4-6 hours a night.  Change equipment as directed. Wash your tubing with warm soap and water daily, hang to dry. Wash humidifier portion weekly. Use bottled distilled and change daily  Be aware of reduced alertness and do not drive or operate heavy machinery if experiencing this or drowsiness.  Exercise encouraged, as tolerated. Healthy weight management discussed.  Notify if persistent daytime sleepiness occurs even with consistent use of CPAP.  We discussed how untreated sleep apnea puts an individual at risk for cardiac arrhthymias, pulm HTN, DM, stroke and increases their risk for daytime accidents. We also briefly reviewed treatment options including weight loss, side sleeping position, oral appliance, CPAP therapy or referral to ENT for possible surgical options  Follow up in 12 weeks with Dr. Wynona Neat or Philis Nettle, or sooner, if needed    Class 3 drug-induced obesity with body mass index (BMI) of 45.0 to 49.9 in adult (HCC) BMI 47. Healthy weight loss encouraged. Awaiting bariatric surgery   I spent 35 minutes of dedicated to the care of this patient on the date of this encounter to include pre-visit review of records, face-to-face time with the patient discussing conditions above, post visit ordering of testing, clinical documentation with the electronic health record, making appropriate referrals as documented, and communicating necessary findings to members of the patients care team.  Noemi Chapel, NP 08/07/2023  Pt aware and understands NP's role.

## 2023-08-08 ENCOUNTER — Other Ambulatory Visit: Payer: Self-pay

## 2023-08-08 ENCOUNTER — Other Ambulatory Visit (HOSPITAL_COMMUNITY): Payer: Self-pay

## 2023-08-15 ENCOUNTER — Telehealth: Payer: Self-pay | Admitting: Internal Medicine

## 2023-08-15 NOTE — Telephone Encounter (Signed)
LM for Orpha Bur that will send message to provider.  Advised the provider is out of office and  unsure if the covering will respond or wait for her provider return. She can call if any further questions

## 2023-08-15 NOTE — Telephone Encounter (Signed)
  Pt c/o medication issue:  1. Name of Medication:   losartan-hydrochlorothiazide (HYZAAR) 100-25 MG tablet    2. How are you currently taking this medication (dosage and times per day)? Take 1 tablet by mouth daily.   3. Are you having a reaction (difficulty breathing--STAT)? No   4. What is your medication issue? Katy from the PCP office called on behalf of Robin [name]. They would like to ask for Dr. Verna Czech recommendation regarding this medication. Specifically, they would like to know if Dr. Wyline Mood recommends that the patient continue taking this medication and if they could also take over handling the refill for the patient

## 2023-08-21 NOTE — Telephone Encounter (Signed)
LVM to call back. Will send a MyChart message.   Per Dr. Wyline Mood. She can continue this medication, can direct her PCP to my note. She can refill the medication

## 2023-08-21 NOTE — Telephone Encounter (Signed)
Call to [facility], unknown what area she works in.  [employer] on triage line regarding call and to call back to me at triage

## 2023-08-21 NOTE — Telephone Encounter (Signed)
Attempt to speak to [facility] and she has left .  Attempt another triage nurse but unable to take call as on another line.  LM on DON VM that we have made several attempts to call their office with no return call. Please call to discuss

## 2023-08-23 ENCOUNTER — Ambulatory Visit (HOSPITAL_BASED_OUTPATIENT_CLINIC_OR_DEPARTMENT_OTHER): Payer: Medicaid Other | Admitting: Physical Therapy

## 2023-08-23 ENCOUNTER — Encounter (HOSPITAL_BASED_OUTPATIENT_CLINIC_OR_DEPARTMENT_OTHER): Payer: Self-pay | Admitting: Physical Therapy

## 2023-08-23 ENCOUNTER — Other Ambulatory Visit: Payer: Self-pay

## 2023-08-23 DIAGNOSIS — M545 Low back pain, unspecified: Secondary | ICD-10-CM

## 2023-08-23 DIAGNOSIS — M6281 Muscle weakness (generalized): Secondary | ICD-10-CM

## 2023-08-23 MED ORDER — LOSARTAN POTASSIUM-HCTZ 100-25 MG PO TABS: 1.0000 | ORAL_TABLET | Freq: Every day | ORAL | 3 refills | Status: DC
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
## 2023-08-29 DIAGNOSIS — G4733 Obstructive sleep apnea (adult) (pediatric): Secondary | ICD-10-CM

## 2023-08-31 ENCOUNTER — Other Ambulatory Visit (HOSPITAL_COMMUNITY): Payer: Self-pay

## 2023-09-03 ENCOUNTER — Encounter (HOSPITAL_BASED_OUTPATIENT_CLINIC_OR_DEPARTMENT_OTHER): Payer: Self-pay | Admitting: Physical Therapy

## 2023-09-03 ENCOUNTER — Ambulatory Visit (HOSPITAL_BASED_OUTPATIENT_CLINIC_OR_DEPARTMENT_OTHER): Payer: Medicaid Other | Attending: Orthopedic Surgery | Admitting: Physical Therapy

## 2023-09-03 DIAGNOSIS — M6281 Muscle weakness (generalized): Secondary | ICD-10-CM | POA: Insufficient documentation

## 2023-09-03 DIAGNOSIS — M545 Low back pain, unspecified: Secondary | ICD-10-CM | POA: Insufficient documentation

## 2023-09-03 NOTE — Therapy (Signed)
OUTPATIENT PHYSICAL THERAPY THORACOLUMBAR EVALUATION   Patient Name: Robin [name] MRN: [id number] DOB:04-12-1971, 52 y.o., female Today's Date: 09/03/2023  END OF SESSION:  PT End of Session - 09/03/23 1420     Visit Number 2    Number of Visits 4    Date for PT Re-Evaluation 10/31/23    Authorization Type [place] Medicaid    PT Start Time 1403    PT Stop Time 1445    PT Time Calculation (min) 42 min    Activity Tolerance Patient tolerated treatment well;Patient limited by pain    Behavior During Therapy WFL for tasks assessed/performed              Past Medical History:  Diagnosis Date   Allergy    Anxiety    Asthma    Bilateral carpal tunnel syndrome    Degeneration of lumbar intervertebral disc    Depression    DM (diabetes mellitus) (HCC)    GERD (gastroesophageal reflux disease)    Hyperlipidemia    Hypertension    OSA (obstructive sleep apnea)    Osteoarthritis of both knees    Pneumonia    Rheumatoid arthritis (HCC)    Sarcoidosis    Spinal stenosis    Vitamin D deficiency    Past Surgical History:  Procedure Laterality Date   ABDOMINAL HYSTERECTOMY     DEBRIDEMENT AND CLOSURE WOUND N/A 06/15/2021   Procedure: Irrigation and debridement and closure of spine wound;  Surgeon: Venita Lick, MD;  Location: [facility] OR;  Service: Orthopedics;  Laterality: N/A;   ESOPHAGOGASTRODUODENOSCOPY     FOOT SURGERY Bilateral    Both surgeries for bunions by Dr. Wynelle Cleveland per pt   TRANSFORAMINAL LUMBAR INTERBODY FUSION (TLIF) WITH PEDICLE SCREW FIXATION 1 LEVEL N/A 05/05/2021   Procedure: TRANSFORAMINAL LUMBAR INTERBODY FUSION (TLIF) WITH PEDICLE SCREW FIXATION 1 LEVEL L2-3;  Surgeon: Venita Lick, MD;  Location: [facility] OR;  Service: Orthopedics;  Laterality: N/A;  4 hrs   Patient Active Problem List   Diagnosis Date Noted   Bilateral shoulder pain 04/25/2023   Dysphagia 04/17/2023   Gastroesophageal reflux disease without esophagitis 04/17/2023   Colon cancer  screening 04/17/2023   Constipation 04/17/2023   Bilateral carpal tunnel syndrome 03/12/2023   Positive ANA (antinuclear antibody) 12/05/2022   Vitamin D deficiency 11/27/2022   High risk medication use 04/19/2022   Financial insecurity 02/13/2022   Anxiety and depression 01/30/2022   Hypercholesterolemia 01/30/2022   Primary hypertension 01/30/2022   Rheumatoid arthritis of multiple sites with negative rheumatoid factor (HCC) 01/30/2022   Osteoarthritis of left knee 06/01/2021   Lumbar spinal stenosis status post L2-L3 TLIF 05/05/2021   Degenerative scoliosis 04/18/2021   Sarcoidosis 05/10/2020   Pain of left hip joint 03/24/2020   Cervical spondylosis without myelopathy 07/16/2019   Neck pain 07/16/2019   Class 3 drug-induced obesity with body mass index (BMI) of 45.0 to 49.9 in adult (HCC) 10/18/2018   Primary osteoarthritis of both knees 10/18/2018   Hammertoe of right foot 07/02/2018   OSA (obstructive sleep apnea) 10/26/2014    PCP:  Quita Skye, PA-C       REFERRING PROVIDER:  Venita Lick, MD     REFERRING DIAG: M54.50 (ICD-10-CM) - Low back pain, unspecified   Rationale for Evaluation and Treatment: Rehabilitation  THERAPY DIAG:  Pain, lumbar region  Muscle weakness (generalized)  ONSET DATE: 2+ years   SUBJECTIVE:  SUBJECTIVE STATEMENT: "Pain is ok took my strong med this morning knowing I was coming here" Go to [facility] 3 x week  Pt states that the pain in her back will get up to 8-9/10. She can sit an hour at the most- pressure in LBP or buttocks. Only relief is laying down. She cannot do standing ADL due to the pain. Pt has NT into bilat LE with L LE into the toes. NT started with first surgery and has slowly gotten worse. Pt does endorse sudden leg weakness that seems  to be from her back. She also has significant knee pain which may cause balance issues. She cannot sit upright due to the pain. She has to eat standing and leaning over. Sleeps prone with pillow under hips. Pt always has to use a cane to walk to ease her back pain. Pt states she was referred for aquatic therapy. Pt does have night pain into the hips. Pt has most difficulty with chores and baseline housework.   Pt is currently at [facility] exercising in the pool but is not sure of what to do. She is just there to move and burn calories but would like guidance on specific LBP exercise     PERTINENT HISTORY:  bilateral osteoarthritis of the knees   Rheumatoid arthritis, obesity, hypertension, chronic pain, sarcoidosis, and previous fusion L2-3 with adjacent degenerative disease L3-S1   two epidural injections at [facility] this year, with the last one being a left L5 nerve block on 04/30/2023  Depression, DM, anxiety,   Are you having pain? Yes: NPRS scale: 6/10 Pain location: L/S, buttocks Pain description: deep throbbing Aggravating factors: standing, walking, ADL, lifting, carrying,  Relieving factors: bending fwd, laying down,   PRECAUTIONS: None   WEIGHT BEARING RESTRICTIONS: No  FALLS:  Has patient fallen in last 6 months? Yes. Number of falls 2, 1- unknown cause and went down, 1- loss of balance, knee gave way   LIVING ENVIRONMENT: Lives with: lives alone Lives in: House/apartment Stairs: Yes Has following equipment at home: Single point cane  OCCUPATION: not currently  PLOF: Independent with basic ADLs  PATIENT GOALS: be able to walk the grocery store and return to walking for exercise    OBJECTIVE:   DIAGNOSTIC FINDINGS:  N/A in Epic  PATIENT SURVEYS:  Oswestry Score: 38 / 50 or 76 %  SCREENING FOR RED FLAGS: Bowel or bladder incontinence: No Spinal tumors: No Cauda equina syndrome: No Compression fracture: No   COGNITION: Overall cognitive status:  Within functional limits for tasks assessed     SENSATION: Light touch: Impaired   POSTURE: increased lumbar lordosis and flexed trunk   LUMBAR ROM: limited by body habitus  AROM eval  Flexion 75%  Extension 0% p! Down legs  Right lateral flexion 50%  Left lateral flexion 50% p!  Right rotation 50% p!  Left rotation 50%   (Blank rows = not tested)  LOWER EXTREMITY ROM:   Limited throughout bilat LE due to pain and body habitus; more limited L hip ROM due to L hip joint pain  LOWER EXTREMITY MMT:  unable to test due to increase in pain from AROM testing  LUMBAR SPECIAL TESTS:  Straight leg raise test: Positive and Slump test: Positive  Unable to perform Tandem or SLS; painful and requires consistent UE contact   FUNCTIONAL TESTS:  5 times sit to stand: 44.1 s Intermittent UE use  GAIT: Distance walked: 88ft Assistive device utilized: Single point cane Level of assistance:  Modified independence Comments: compensated Trendelenburg; R side lean  TODAY'S TREATMENT:                                                                                                                              DATE: 9/9  Pt seen for aquatic therapy today.  Treatment took place in water 3.5-4.75 ft in depth at the [facility] pool. Temp of water was 91.  Pt entered/exited the pool via stairs using step to pattern with bilateral hand rail.  *Intro to setting *walking forward and backward 4.5 ft+ x 4 widths ea *Side stepping x 4 widths *squatted rest period *standing ue support wall 4.3 ft: df; pf; high knee marching; hip add/abd; x 5-10 *decompression noodle wrapped posteriorly.  Assisted cyclying *Walking forward, back and side stepping multiple widths unsupported.  Cues for step length and arm swing *Seated on water bench: LAQ; hip add/abd (required hips at <90d angle to tolerate) *TrA set seated using 1/2 noodle 2 x 10  Pt requires the buoyancy and hydrostatic pressure of water  for support, and to offload joints by unweighting joint load by at least 50 % in navel deep water and by at least 75-80% in chest to neck deep water.  Viscosity of the water is needed for resistance of strengthening. Water current perturbations provides challenge to standing balance requiring increased core activation.    Exercises  Walking program in pool environment  - Seated Lumbar Flexion Stretch  - 2-3 x daily - 7 x weekly - 1 sets - 10 reps - 3 hold - Seated Sciatic Nerve Glide  - 2-3 x daily - 7 x weekly - 1 sets - 10 reps - 2 hold    PATIENT EDUCATION:  Education details: MOI, diagnosis, prognosis, anatomy, exercise progression, DOMS expectations, muscle firing,  envelope of function, HEP, POC  Person educated: Patient Education method: Explanation, Demonstration, Tactile cues, Verbal cues, and Handouts Education comprehension: verbalized understanding, returned demonstration, verbal cues required, and tactile cues required  HOME EXERCISE PROGRAM:  Access Code: [email or web address] URL: [email or web address] Date: 08/02/2023 Prepared by: Zebedee Iba  ASSESSMENT:  CLINICAL IMPRESSION: Pt demonstrates safety and indep in setting with therapist instructing from deck. She continues to be highly pain sensitive requiring multiple breaks throughout session.  Postioning for exercises kept in 4.3 ft + depth for maximal buoyancy effect for decompression and reduction of loading forces. Standing hip abd increases left lb and radicular pain which is reduced with squatted rest period. She tolerates session overall fairly well.  Reduction in pain by 2 NPRS. She is a good candidate for aquatic intervention using the properties of water to reach goals and maximize functional lumbar and LE strength and ROM for prevention of functional decline. Plan to create HEP and issue for completion at [facility] indep by DC (2 more visits).   Patient is a 52 y.o. female who was seen today for physical therapy  evaluation and treatment for c/c of LBP.  Pt's s/s appear consistent with significant lumbar radiculopathy. Pt's pain is highly sensitive and irritable with movement. Pt's known knee and hip OA also greatly affects her functional mobility. Plan to continue with aquatic therapy and lumbopelvic strength at future sessions. Pt is greatly limited with ADL, ambulation, and general mobility due to her current level of multifactorial pain. Pt would benefit from continued skilled therapy in order to reach goals and maximize functional lumbar and LE strength and ROM for prevention of functional decline.    OBJECTIVE IMPAIRMENTS: Abnormal gait, decreased activity tolerance, decreased balance, decreased mobility, difficulty walking, decreased ROM, decreased strength, hypomobility, increased muscle spasms, impaired sensation, improper body mechanics, postural dysfunction, obesity, and pain.   ACTIVITY LIMITATIONS: carrying, lifting, sitting, standing, squatting, sleeping, stairs, transfers, and locomotion level  PARTICIPATION LIMITATIONS: meal prep, cleaning, laundry, interpersonal relationship, driving, shopping, community activity, yard work, and exercise  PERSONAL FACTORS: Age, Fitness, Past/current experiences, Social background, Time since onset of injury/illness/exacerbation, and 3+ comorbidities:    are also affecting patient's functional outcome.   REHAB POTENTIAL: Fair    CLINICAL DECISION MAKING: Evolving/moderate complexity  EVALUATION COMPLEXITY: Moderate   GOALS:   Short and Long-Term GOALS: Target date: 10/25/2023   Pt  will become independent with HEP in order to demonstrate synthesis of PT education.  Baseline: Goal status: INITIAL  2.  Pt will report at least 2 pt reduction on NPRS scale for pain in order to demonstrate functional improvement with household activity, self care, and ADL.  Baseline:  Goal status: INITIAL   3.  Pt will be able to demonstrate/report ability to  sit/stand/sleep for extended periods of time without pain in order to demonstrate functional improvement and tolerance to static positioning.  Baseline:  Goal status: INITIAL  4.  Pt will be able to demonstrate/report ability to walk >10 mins without pain in order to demonstrate functional improvement and tolerance to exercise and community mobility.  Baseline:  Goal status: INITIAL  5.  Pt will demonstrate at least a 12.8 improvement in Oswestry Index in order to demonstrate a clinically significant change in LBP and function.  Baseline:  Goal status: INITIAL   PLAN:  PT FREQUENCY: 1x/week  PT DURATION: 4 weeks (re-cert for more visits in 4)  PLANNED INTERVENTIONS: Therapeutic exercises, Therapeutic activity, Neuromuscular re-education, Balance training, Gait training, Patient/Family education, Self Care, Joint mobilization, Joint manipulation, Stair training, Orthotic/Fit training, DME instructions, Aquatic Therapy, Dry Needling, Electrical stimulation, Spinal manipulation, Spinal mobilization, Cryotherapy, Moist heat, scar mobilization, Splintting, Taping, Vasopneumatic device, Traction, Ultrasound, Ionotophoresis 4mg /ml Dexamethasone, Manual therapy, and Re-evaluation.  PLAN FOR NEXT SESSION: aquatic intro: create aquatic HEP- pt has [employer] and is currently using pool    [place], PT 09/03/2023, 2:21 PM   Check all possible CPT codes: 62952 - PT Re-evaluation, 97110- Therapeutic Exercise, [phone]- Neuro Re-education, [phone] - Gait Training, [phone] - Manual Therapy, [phone] - Therapeutic Activities, [id number] - Electrical stimulation (unattended), [phone] - Electrical stimulation (Manual), [id number] - Iontophoresis, [id number] - Ultrasound, [id number] - Orthotic Fit, and [id number] - Aquatic therapy    Check all conditions that are expected to impact treatment: {Conditions expected to impact treatment:Morbid obesity, Diabetes mellitus, Musculoskeletal disorders, Social determinants of health, and  Complications related to surgery   If treatment provided at initial evaluation, no treatment charged due to lack of authorization.

## 2023-09-10 ENCOUNTER — Other Ambulatory Visit (HOSPITAL_COMMUNITY): Payer: Self-pay

## 2023-09-11 ENCOUNTER — Encounter (HOSPITAL_BASED_OUTPATIENT_CLINIC_OR_DEPARTMENT_OTHER): Payer: Self-pay | Admitting: Physical Therapy

## 2023-09-11 ENCOUNTER — Ambulatory Visit (HOSPITAL_BASED_OUTPATIENT_CLINIC_OR_DEPARTMENT_OTHER): Payer: Medicaid Other | Admitting: Physical Therapy

## 2023-09-11 DIAGNOSIS — M545 Low back pain, unspecified: Secondary | ICD-10-CM

## 2023-09-11 DIAGNOSIS — M6281 Muscle weakness (generalized): Secondary | ICD-10-CM

## 2023-09-11 NOTE — Therapy (Addendum)
OUTPATIENT PHYSICAL THERAPY THORACOLUMBAR treatment PHYSICAL THERAPY DISCHARGE SUMMARY  Visits from Start of Care: 3  Current functional level related to goals / functional outcomes: unknown   Remaining deficits: Chronic pain   Education / Equipment: Management of condition/ HEP   Patient agrees to discharge. Patient goals were not met. Patient is being discharged due to not returning since the last visit.  Addend Corrie Dandy Tomma Lightning) Dondrea Clendenin MPT 01/23/24 9:40 AM [facility] [address] [place], [place], [address] Phone: [phone]   Fax:  [phone]     Patient Name: Robin [name] MRN: [id number] DOB:[birth year], 53 y.o., female Today's Date: 09/11/2023  END OF SESSION:  PT End of Session - 09/11/23 1352     Visit Number 3    Number of Visits 4    Date for PT Re-Evaluation 10/31/23    Authorization Type [place] Medicaid    PT Start Time 1400    PT Stop Time 1445    PT Time Calculation (min) 45 min    Activity Tolerance Patient tolerated treatment well;Patient limited by pain    Behavior During Therapy WFL for tasks assessed/performed              Past Medical History:  Diagnosis Date   Allergy    Anxiety    Asthma    Bilateral carpal tunnel syndrome    Degeneration of lumbar intervertebral disc    Depression    DM (diabetes mellitus) (HCC)    GERD (gastroesophageal reflux disease)    Hyperlipidemia    Hypertension    OSA (obstructive sleep apnea)    Osteoarthritis of both knees    Pneumonia    Rheumatoid arthritis (HCC)    Sarcoidosis    Spinal stenosis    Vitamin D deficiency    Past Surgical History:  Procedure Laterality Date   ABDOMINAL HYSTERECTOMY     DEBRIDEMENT AND CLOSURE WOUND N/A 06/15/2021   Procedure: Irrigation and debridement and closure of spine wound;  Surgeon: Venita Lick, MD;  Location: [facility] OR;  Service: Orthopedics;  Laterality: N/A;   ESOPHAGOGASTRODUODENOSCOPY      FOOT SURGERY Bilateral    Both surgeries for bunions by Dr. Wynelle Cleveland per pt   TRANSFORAMINAL LUMBAR INTERBODY FUSION (TLIF) WITH PEDICLE SCREW FIXATION 1 LEVEL N/A 05/05/2021   Procedure: TRANSFORAMINAL LUMBAR INTERBODY FUSION (TLIF) WITH PEDICLE SCREW FIXATION 1 LEVEL L2-3;  Surgeon: Venita Lick, MD;  Location: [facility] OR;  Service: Orthopedics;  Laterality: N/A;  4 hrs   Patient Active Problem List   Diagnosis Date Noted   Bilateral shoulder pain 04/25/2023   Dysphagia 04/17/2023   Gastroesophageal reflux disease without esophagitis 04/17/2023   Colon cancer screening 04/17/2023   Constipation 04/17/2023   Bilateral carpal tunnel syndrome 03/12/2023   Positive ANA (antinuclear antibody) 12/05/2022   Vitamin D deficiency 11/27/2022   High risk medication use 04/19/2022   Financial insecurity 02/13/2022   Anxiety and depression 01/30/2022   Hypercholesterolemia 01/30/2022   Primary hypertension 01/30/2022   Rheumatoid arthritis of multiple sites with negative rheumatoid factor (HCC) 01/30/2022   Osteoarthritis of left knee 06/01/2021   Lumbar spinal stenosis status post L2-L3 TLIF 05/05/2021   Degenerative scoliosis 04/18/2021   Sarcoidosis 05/10/2020   Pain of left hip joint 03/24/2020   Cervical spondylosis without myelopathy 07/16/2019   Neck pain 07/16/2019   Class 3 drug-induced obesity with body mass index (BMI) of 45.0 to 49.9 in adult [facility]) 10/18/2018   Primary osteoarthritis of both knees  10/18/2018   Hammertoe of right foot 07/02/2018   OSA (obstructive sleep apnea) 10/26/2014    PCP:  Quita Skye, PA-C       REFERRING PROVIDER:  Venita Lick, MD     REFERRING DIAG: M54.50 (ICD-10-CM) - Low back pain, unspecified   Rationale for Evaluation and Treatment: Rehabilitation  THERAPY DIAG:  Pain, lumbar region  Muscle weakness (generalized)  ONSET DATE: 2+ years   SUBJECTIVE:                                                                                                                                                                                            SUBJECTIVE STATEMENT: "Pain up from bad weather.  Felt good for 2-3 hours after last session then pain came back full force" Gastroc sleeve surgery 10/22.   Initial Subjective Pt states that the pain in her back will get up to 8-9/10. She can sit an hour at the most- pressure in LBP or buttocks. Only relief is laying down. She cannot do standing ADL due to the pain. Pt has NT into bilat LE with L LE into the toes. NT started with first surgery and has slowly gotten worse. Pt does endorse sudden leg weakness that seems to be from her back. She also has significant knee pain which may cause balance issues. She cannot sit upright due to the pain. She has to eat standing and leaning over. Sleeps prone with pillow under hips. Pt always has to use a cane to walk to ease her back pain. Pt states she was referred for aquatic therapy. Pt does have night pain into the hips. Pt has most difficulty with chores and baseline housework.   Pt is currently at [facility] exercising in the pool but is not sure of what to do. She is just there to move and burn calories but would like guidance on specific LBP exercise     PERTINENT HISTORY:  bilateral osteoarthritis of the knees   Rheumatoid arthritis, obesity, hypertension, chronic pain, sarcoidosis, and previous fusion L2-3 with adjacent degenerative disease L3-S1   two epidural injections at [facility] this year, with the last one being a left L5 nerve block on 04/30/2023  Depression, DM, anxiety,   Are you having pain? Yes: NPRS scale: 9/10 Pain location: L/S, buttocks Pain description: deep throbbing Aggravating factors: standing, walking, ADL, lifting, carrying,  Relieving factors: bending fwd, laying down,   PRECAUTIONS: None   WEIGHT BEARING RESTRICTIONS: No  FALLS:  Has patient fallen in last 6 months? Yes. Number of falls 2, 1- unknown  cause and went down, 1- loss of balance, knee  gave way   LIVING ENVIRONMENT: Lives with: lives alone Lives in: House/apartment Stairs: Yes Has following equipment at home: Single point cane  OCCUPATION: not currently  PLOF: Independent with basic ADLs  PATIENT GOALS: be able to walk the grocery store and return to walking for exercise    OBJECTIVE:   DIAGNOSTIC FINDINGS:  N/A in Epic  PATIENT SURVEYS:  Oswestry Score: 38 / 50 or 76 %  SCREENING FOR RED FLAGS: Bowel or bladder incontinence: No Spinal tumors: No Cauda equina syndrome: No Compression fracture: No   COGNITION: Overall cognitive status: Within functional limits for tasks assessed     SENSATION: Light touch: Impaired   POSTURE: increased lumbar lordosis and flexed trunk   LUMBAR ROM: limited by body habitus  AROM eval  Flexion 75%  Extension 0% p! Down legs  Right lateral flexion 50%  Left lateral flexion 50% p!  Right rotation 50% p!  Left rotation 50%   (Blank rows = not tested)  LOWER EXTREMITY ROM:   Limited throughout bilat LE due to pain and body habitus; more limited L hip ROM due to L hip joint pain  LOWER EXTREMITY MMT:  unable to test due to increase in pain from AROM testing  LUMBAR SPECIAL TESTS:  Straight leg raise test: Positive and Slump test: Positive  Unable to perform Tandem or SLS; painful and requires consistent UE contact   FUNCTIONAL TESTS:  5 times sit to stand: 44.1 s Intermittent UE use  GAIT: Distance walked: 24ft Assistive device utilized: Single point cane Level of assistance: Modified independence Comments: compensated Trendelenburg; R side lean  TODAY'S TREATMENT:                                                                                                                              DATE: 9/9  Pt seen for aquatic therapy today.  Treatment took place in water 3.5-4.75 ft in depth at the [facility] pool. Temp of water was 91.  Pt  entered/exited the pool via stairs using step to pattern with bilateral hand rail.   *walking forward and backward 4.5 ft+ x 4 widths ea *Side stepping x 4 widths *decompression on yellow noodle *Farmers carry with yellow HB forward, back and side stepping completed in 4.23ft Not tolerated well *decompression noodle wrapped posteriorly.  Assisted cyclying *Seated on 3rd step: cycling; hip add/abd; flutter kicking x 15-20 *TrA set seated using 1/2 noodle 2 x 10  Pt requires the buoyancy and hydrostatic pressure of water for support, and to offload joints by unweighting joint load by at least 50 % in navel deep water and by at least 75-80% in chest to neck deep water.  Viscosity of the water is needed for resistance of strengthening. Water current perturbations provides challenge to standing balance requiring increased core activation.    Exercises  Walking program in pool environment  - Seated Lumbar Flexion Stretch  - 2-3 x daily - 7 x  weekly - 1 sets - 10 reps - 3 hold - Seated Sciatic Nerve Glide  - 2-3 x daily - 7 x weekly - 1 sets - 10 reps - 2 hold    PATIENT EDUCATION:  Education details: MOI, diagnosis, prognosis, anatomy, exercise progression, DOMS expectations, muscle firing,  envelope of function, HEP, POC  Person educated: Patient Education method: Explanation, Demonstration, Tactile cues, Verbal cues, and Handouts Education comprehension: verbalized understanding, returned demonstration, verbal cues required, and tactile cues required  HOME EXERCISE PROGRAM:  Access Code: [email or web address] URL: [email or web address] Date: 08/02/2023 Prepared by: Zebedee Iba   Aquatic This aquatic home exercise program from Med[employer] utilizes pictures from land based exercises, but has been adapted prior to lamination and issuance.   Access Code: [email or web address] URL: [email or web address] Date: 09/11/2023 Prepared by: Geni Bers  Exercises - Walking  - 1 x daily  - 1-3 x weekly - 1-3 sets - 10 reps - Noodle Press  - 1 x daily - 1-3 x weekly - 1-3 sets - 10 reps - Seated Straddle on Flotation Forward Breast Stroke Arms and Bicycle Legs  - Flutter Kicking/Windshield Wipers  - 1 x daily - 1-3 x weekly - 1-3 sets - 10 reps - Heel Toe Raises at Pool Wall  - 1 x daily - 1-3 x weekly - 1-3 sets - 10 reps - Standing Hip Abduction Adduction at Pool Wall  - 1 x daily - 1-3 x weekly - 1-3 sets - 10 reps - Standing Hip Flexion Extension at [employer]  - 1 x daily - 71-3 x weekly - 1-3 sets - 10 reps Issued ASSESSMENT:  CLINICAL IMPRESSION: Worked on [employer], political party.  Trialed multiple exercises. She is highly pain sensitive and limited. Does not tolerate any exercise which puts her in upright position. Increases pain through lumbosacral area requiring decompression rest period.  She reports she walks at pool in [facility] and completes jumping jacks (increases pain).  Created program today which eliminates jumping jacks and focuses on core strength. Program laminated and issued.    Initial Impression Patient is a 52 y.o. female who was seen today for physical therapy evaluation and treatment for c/c of LBP. Pt's s/s appear consistent with significant lumbar radiculopathy. Pt's pain is highly sensitive and irritable with movement. Pt's known knee and hip OA also greatly affects her functional mobility. Plan to continue with aquatic therapy and lumbopelvic strength at future sessions. Pt is greatly limited with ADL, ambulation, and general mobility due to her current level of multifactorial pain. Pt would benefit from continued skilled therapy in order to reach goals and maximize functional lumbar and LE strength and ROM for prevention of functional decline.    OBJECTIVE IMPAIRMENTS: Abnormal gait, decreased activity tolerance, decreased balance, decreased mobility, difficulty walking, decreased ROM, decreased strength, hypomobility, increased muscle spasms, impaired sensation,  improper body mechanics, postural dysfunction, obesity, and pain.   ACTIVITY LIMITATIONS: carrying, lifting, sitting, standing, squatting, sleeping, stairs, transfers, and locomotion level  PARTICIPATION LIMITATIONS: meal prep, cleaning, laundry, interpersonal relationship, driving, shopping, community activity, yard work, and exercise  PERSONAL FACTORS: Age, Fitness, Past/current experiences, Social background, Time since onset of injury/illness/exacerbation, and 3+ comorbidities:    are also affecting patient's functional outcome.   REHAB POTENTIAL: Fair    CLINICAL DECISION MAKING: Evolving/moderate complexity  EVALUATION COMPLEXITY: Moderate   GOALS:   Short and Long-Term GOALS: Target date: 10/25/2023   Pt  will become independent with HEP in order to demonstrate synthesis of PT education.  Baseline: Goal  status: INITIAL  2.  Pt will report at least 2 pt reduction on NPRS scale for pain in order to demonstrate functional improvement with household activity, self care, and ADL.  Baseline:  Goal status: INITIAL   3.  Pt will be able to demonstrate/report ability to sit/stand/sleep for extended periods of time without pain in order to demonstrate functional improvement and tolerance to static positioning.  Baseline:  Goal status: INITIAL  4.  Pt will be able to demonstrate/report ability to walk >10 mins without pain in order to demonstrate functional improvement and tolerance to exercise and community mobility.  Baseline:  Goal status: INITIAL  5.  Pt will demonstrate at least a 12.8 improvement in Oswestry Index in order to demonstrate a clinically significant change in LBP and function.  Baseline:  Goal status: INITIAL   PLAN:  PT FREQUENCY: 1x/week  PT DURATION: 4 weeks (re-cert for more visits in 4)  PLANNED INTERVENTIONS: Therapeutic exercises, Therapeutic activity, Neuromuscular re-education, Balance training, Gait training, Patient/Family education, Self  Care, Joint mobilization, Joint manipulation, Stair training, Orthotic/Fit training, DME instructions, Aquatic Therapy, Dry Needling, Electrical stimulation, Spinal manipulation, Spinal mobilization, Cryotherapy, Moist heat, scar mobilization, Splintting, Taping, Vasopneumatic device, Traction, Ultrasound, Ionotophoresis 4mg /ml Dexamethasone, Manual therapy, and Re-evaluation.  PLAN FOR NEXT SESSION: aquatic intro: create aquatic HEP- pt has [employer] and is currently using pool    [place], [place], PT 09/11/2023, 1:53 PM   Check all possible CPT codes: 16109 - PT Re-evaluation, 97110- Therapeutic Exercise, [phone]- Neuro Re-education, [phone] - Gait Training, [phone] - Manual Therapy, [phone] - Therapeutic Activities, 97014 - Electrical stimulation (unattended), [phone] - Electrical stimulation (Manual), [id number] - Iontophoresis, 97035 - Ultrasound, [id number] - Orthotic Fit, and [id number] - Aquatic therapy    Check all conditions that are expected to impact treatment: {Conditions expected to impact treatment:Morbid obesity, Diabetes mellitus, Musculoskeletal disorders, Social determinants of health, and Complications related to surgery   If treatment provided at initial evaluation, no treatment charged due to lack of authorization.

## 2023-09-18 ENCOUNTER — Ambulatory Visit (HOSPITAL_BASED_OUTPATIENT_CLINIC_OR_DEPARTMENT_OTHER): Payer: Medicaid Other | Admitting: Physical Therapy

## 2023-09-24 DIAGNOSIS — G4733 Obstructive sleep apnea (adult) (pediatric): Secondary | ICD-10-CM

## 2023-10-03 ENCOUNTER — Other Ambulatory Visit: Payer: Self-pay

## 2023-10-04 ENCOUNTER — Encounter: Payer: Self-pay | Admitting: Internal Medicine

## 2023-10-04 ENCOUNTER — Encounter (HOSPITAL_BASED_OUTPATIENT_CLINIC_OR_DEPARTMENT_OTHER): Payer: Self-pay | Admitting: Physical Therapy

## 2023-10-09 ENCOUNTER — Ambulatory Visit: Payer: Medicaid Other | Admitting: Orthopaedic Surgery

## 2023-10-12 ENCOUNTER — Other Ambulatory Visit: Payer: Self-pay

## 2023-10-12 ENCOUNTER — Encounter (HOSPITAL_BASED_OUTPATIENT_CLINIC_OR_DEPARTMENT_OTHER): Payer: Self-pay

## 2023-10-12 ENCOUNTER — Emergency Department (HOSPITAL_BASED_OUTPATIENT_CLINIC_OR_DEPARTMENT_OTHER)
Admission: EM | Admit: 2023-10-12 | Discharge: 2023-10-12 | Disposition: A | Discharge status: Home / Self Care | Payer: Medicaid Other | Attending: Emergency Medicine | Admitting: Emergency Medicine

## 2023-10-12 DIAGNOSIS — A5901 Trichomonal vulvovaginitis: Secondary | ICD-10-CM | POA: Diagnosis not present

## 2023-10-12 DIAGNOSIS — N939 Abnormal uterine and vaginal bleeding, unspecified: Secondary | ICD-10-CM | POA: Diagnosis present

## 2023-10-12 LAB — URINALYSIS, ROUTINE W REFLEX MICROSCOPIC
Bilirubin Urine: NEGATIVE
Glucose, UA: NEGATIVE mg/dL
Ketones, ur: NEGATIVE mg/dL
Leukocytes,Ua: NEGATIVE
Nitrite: NEGATIVE
Protein, ur: NEGATIVE mg/dL
Specific Gravity, Urine: 1.025 (ref 1.005–1.030)
pH: 6 (ref 5.0–8.0)

## 2023-10-12 LAB — URINALYSIS, MICROSCOPIC (REFLEX)

## 2023-10-12 LAB — PREGNANCY, URINE: Preg Test, Ur: NEGATIVE

## 2023-10-12 LAB — WET PREP, GENITAL
Clue Cells Wet Prep HPF POC: NONE SEEN
Sperm: NONE SEEN
WBC, Wet Prep HPF POC: >=10 — AB (ref <10)
Yeast Wet Prep HPF POC: NONE SEEN

## 2023-10-12 MED ORDER — METRONIDAZOLE 500 MG PO TABS: 500.0000 mg | ORAL_TABLET | Freq: Two times a day (BID) | ORAL | 0 refills | Status: DC

## 2023-10-12 NOTE — ED Triage Notes (Signed)
The patient is having vaginal discharge with an odor for three weeks. She is having some lower abd pain for one week.

## 2023-10-12 NOTE — ED Provider Notes (Signed)
[place] [facility] Provider Note   CSN: [id number] Arrival date & time: 10/12/23  1410     History  Chief Complaint  Patient presents with   Abdominal Pain   Vaginal Discharge    Robin [name] is a 52 y.o. female.  52 y.o. female presenting with 3 weeks of vaginal discharge and mild left side pain that comes and goes. The discharge has recently gotten worse in the last 2-3 days making her concerned for infection. She denies fever, chills. She recently completed a course of monistat which seemed to slow down the discharge but not completely take it away. She presented to the ED due to being unable to get into her PCP office.   She reports that she has not been sexually active in over 3 years. She has been screened for HIV by her PCP and reports testing negative. She has a prior history of trichomonas in her 58s that was treated. She reports having a partial hysterectomy many years ago.   The history is provided by the patient. No language interpreter was used.  Abdominal Pain Associated symptoms: vaginal discharge   Associated symptoms: no chest pain, no chills, no diarrhea, no dysuria, no fatigue, no fever, no hematuria, no nausea, no shortness of breath and no vomiting   Vaginal Discharge Associated symptoms: abdominal pain   Associated symptoms: no dysuria, no fever, no nausea and no vomiting        Home Medications Prior to Admission medications   Medication Sig Start Date End Date Taking? Authorizing Provider  metroNIDAZOLE (FLAGYL) 500 MG tablet Take 1 tablet (500 mg total) by mouth 2 (two) times daily. 10/12/23  Yes Glendale Chard, DO  Abatacept (ORENCIA CLICKJECT) 125 MG/ML SOAJ Inject 125 mg into the skin once a week. 08/07/23   Rice, Jamesetta Orleans, MD  albuterol (VENTOLIN HFA) 108 (90 Base) MCG/ACT inhaler Inhale 2 puffs into the lungs every 6 (six) hours as needed for wheezing or shortness of breath. 01/30/22   Julieanne Manson, MD  amLODipine (NORVASC) 10 MG tablet Take 1 tablet (10 mg total) by mouth daily. 01/30/22   Julieanne Manson, MD  atorvastatin (LIPITOR) 20 MG tablet Take 1 tablet (20 mg total) by mouth daily. 01/30/22   Julieanne Manson, MD  buPROPion (WELLBUTRIN SR) 150 MG 12 hr tablet Take 1 tablet (150 mg total) by mouth 2 (two) times daily. 04/06/22   Julieanne Manson, MD  buPROPion (WELLBUTRIN SR) 200 MG 12 hr tablet Take one tablet every morning with breakfast.  Take half  a tablet every evening 07/25/23   [provider]  carvedilol (COREG) 12.5 MG tablet Take 1 tablet (12.5 mg total) by mouth 2 (two) times daily with a meal. 01/31/23   Maisie Fus, MD  cetirizine (ZYRTEC) 10 MG tablet Take 1 tablet by mouth daily. 11/27/22 11/27/23  [provider]  cyclobenzaprine (FLEXERIL) 10 MG tablet Take 1 tablet (10 mg total) by mouth at bedtime as needed. 04/25/23   Rice, Jamesetta Orleans, MD  diclofenac Sodium (VOLTAREN) 1 % GEL Apply topically 4 (four) times daily.    [provider]  escitalopram (LEXAPRO) 20 MG tablet Take by mouth. 11/27/22 05/08/23  [provider]  fluticasone (FLONASE) 50 MCG/ACT nasal spray Place into the nose. 11/27/22   [provider]  losartan-hydrochlorothiazide (HYZAAR) 100-25 MG tablet Take 1 tablet by mouth daily. 08/23/23   Maisie Fus, MD  Melatonin 10 MG CAPS Take 20 mg  by mouth at bedtime as needed (sleep).    [provider]  pantoprazole (PROTONIX) 40 MG tablet Take 1 tablet (40 mg total) by mouth daily. 04/17/23   Zehr, Princella Pellegrini, PA-C  traMADol (ULTRAM) 50 MG tablet Take 1 tablet (50 mg total) by mouth 3 (three) times daily as needed. 06/14/23   Monica Becton, MD      Allergies    Lisinopril    Review of Systems   Review of Systems  Constitutional:  Negative for chills, fatigue and fever.  Respiratory:  Negative for shortness of breath.   Cardiovascular:  Negative for chest pain.  Gastrointestinal:   Positive for abdominal pain. Negative for diarrhea, nausea and vomiting.  Genitourinary:  Positive for vaginal discharge. Negative for dysuria, flank pain, hematuria, pelvic pain, urgency and vaginal pain.    Physical Exam Updated Vital Signs BP (!) 154/96 (BP Location: Left Arm)   Pulse 85   Temp 99 F (37.2 C) (Oral)   Resp 20   Ht 5\' 9"  (1.753 m)   Wt (!) 146 kg   SpO2 99%   BMI 47.53 kg/m  Physical Exam Constitutional:      General: She is not in acute distress.    Appearance: She is well-developed and normal weight. She is not ill-appearing.  HENT:     Head: Normocephalic and atraumatic.  Abdominal:     General: Abdomen is flat. Bowel sounds are normal. There is no distension. There are no signs of injury.     Palpations: Abdomen is soft.     Tenderness: There is no abdominal tenderness. There is no right CVA tenderness or left CVA tenderness.  Genitourinary:    Vagina: Vaginal discharge present. No bleeding.     Cervix: Normal.     Uterus: Absent.   Skin:    General: Skin is warm and dry.     Capillary Refill: Capillary refill takes less than 2 seconds.  Neurological:     General: No focal deficit present.     Mental Status: She is alert and oriented to person, place, and time.     ED Results / Procedures / Treatments   Labs (all labs ordered are listed, but only abnormal results are displayed) Labs Reviewed  WET PREP, GENITAL - Abnormal; Notable for the following components:      Result Value   Trich, Wet Prep PRESENT (*)    WBC, Wet Prep HPF POC >=10 (*)    All other components within normal limits  URINALYSIS, ROUTINE W REFLEX MICROSCOPIC - Abnormal; Notable for the following components:   Hgb urine dipstick TRACE (*)    All other components within normal limits  URINALYSIS, MICROSCOPIC (REFLEX) - Abnormal; Notable for the following components:   Bacteria, UA MANY (*)    Trichomonas, UA PRESENT (*)    All other components within normal limits   PREGNANCY, URINE  RPR  GC/CHLAMYDIA PROBE AMP ([place]) NOT AT [facility]    EKG None  Radiology No results found.  Procedures Procedures    Medications Ordered in ED Medications - No data to display  ED Course/ Medical Decision Making/ A&P Clinical Course as of 10/12/23 1600  Fri Oct 12, 2023  1509 Stable Resident Ivery Quale.  [CC]    Clinical Course User Index [CC] Glyn Ade, MD  Medical Decision Making 52 y.o. female presenting with 3 weeks of vaginal discharge.  On arrival vital signs significant for elevated blood pressure 154/96.  On exam she is well-appearing in no acute distress.  Pelvic exam chaperoned by nurse tech in the ED.  Moderate malodorous vaginal discharge present.  Will test for GC, wet prep, RPR.  With history of abdominal hysterectomy low suspicion for PID.  Patient is afebrile, well-appearing, with no CVA tenderness low suspicion for acute pyelonephritis.  Will check UA to rule out UTI.  Urinalysis and wet prep positive for trichomonas.  Will treat with 7-day course of metronidazole 500 mg BID.  Shared decision making with patient to treat trichomonas only at this time and to follow-up on GC and RPR labs with her PCP.  Patient to review results via MyChart and follow-up with PCP for further treatment as indicated.   Amount and/or Complexity of Data Reviewed Labs: ordered. Decision-making details documented in ED Course.  Risk Prescription drug management.          Final Clinical Impression(s) / ED Diagnoses Final diagnoses:  Trichomonas vaginitis    Rx / DC Orders ED Discharge Orders          Ordered    metroNIDAZOLE (FLAGYL) 500 MG tablet  2 times daily        10/12/23 1530              Glendale Chard, DO 10/12/23 1600    Glyn Ade, MD 10/12/23 1918

## 2023-10-12 NOTE — ED Notes (Signed)
Discharge paperwork reviewed entirely with patient, including follow up care. Pain was under control. The patient received instruction and coaching on their prescriptions, and all follow-up questions were answered.  Pt verbalized understanding as well as all parties involved. No questions or concerns voiced at the time of discharge. No acute distress noted.   Pt ambulated out to PVA without incident or assistance.  Pt advised they will notify their PCP.

## 2023-10-12 NOTE — Discharge Instructions (Signed)
You have tested positive for trichomonas infection. You will be treated with a 7 day course of metronidazole.  We will have your gonorrhea, chlamydia, syphilis testing available via MyChart.  Follow-up with your PCP as needed.

## 2023-10-13 LAB — RPR: RPR Ser Ql: NONREACTIVE

## 2023-10-15 LAB — GC/CHLAMYDIA PROBE AMP (~~LOC~~) NOT AT ARMC
Chlamydia: NEGATIVE
Comment: NEGATIVE
Comment: NORMAL
Neisseria Gonorrhea: NEGATIVE

## 2023-10-16 HISTORY — PX: OTHER SURGICAL HISTORY: SHX169

## 2023-10-31 ENCOUNTER — Ambulatory Visit (INDEPENDENT_AMBULATORY_CARE_PROVIDER_SITE_OTHER): Payer: Medicaid Other | Admitting: Nurse Practitioner

## 2023-10-31 ENCOUNTER — Encounter: Payer: Self-pay | Admitting: Nurse Practitioner

## 2023-10-31 VITALS — BP 126/80 | HR 80 | Temp 98.8°F | Ht 70.0 in | Wt 304.2 lb

## 2023-10-31 DIAGNOSIS — G4733 Obstructive sleep apnea (adult) (pediatric): Secondary | ICD-10-CM

## 2023-10-31 DIAGNOSIS — Z9889 Other specified postprocedural states: Secondary | ICD-10-CM | POA: Insufficient documentation

## 2023-10-31 NOTE — Patient Instructions (Signed)
Increase use CPAP every night, minimum of 4-6 hours a night.  Change equipment as directed. Wash your tubing with warm soap and water daily, hang to dry. Wash humidifier portion weekly. Use bottled, distilled water and change daily Be aware of reduced alertness and do not drive or operate heavy machinery if experiencing this or drowsiness.  Exercise encouraged, as tolerated. Healthy weight management discussed.  Avoid or decrease alcohol consumption and medications that make you more sleepy, if possible. Notify if persistent daytime sleepiness occurs even with consistent use of PAP therapy.  Get mask liners for your mask to help prevent the skin irritation. If this doesn't solve the problem, we could also try a memory foam mask or a nasal mask.   Monitor mask fit and pressures on CPAP as you lose weight  Follow up in 6-8 weeks with Florentina Addison Luiz Trumpower,NP, or sooner, if needed

## 2023-10-31 NOTE — Progress Notes (Signed)
@Robin [name] [name]  ID: Robin [name] Robin [name] [name], female    DOB: 07/10/1971, 52 y.o.   MRN: [id number]  Chief Complaint  Robin [name] [name] presents with   Follow-up    [name] is irritating [name] around mouth area. Sensitive [name].  Waiting for [employer] to get her a nasal pillow [name].  Otherwise, CPAP working great.    Referring provider: Quita Skye, PA-C  HPI: 52 year old female, former remote smoker followed for severe OSA. Past medical history significant for HTN, OSA not on CPAP, RA, anxiety, depression, vit d deficiency.   TEST/EVENTS:  2010 NPSG: AHI 8/h 06/12/2023 Split [name] Study: AHI 30.7/h, SpO2 low 88% >> optimal pressure 14 cmH2O; weight 330 lb  02/15/2023: Ov with Robin [name] Wence NP for sleep consult, referred by Dr. Wyline [name].  She was previously seen in our office by Dr. Stann [name] in 2015.  She had mild obstructive sleep apnea diagnosed in 2010.  She was started on CPAP therapy and had been on it for many years.  At some point, she lost her insurance and stopped receiving supplies so she stopped wearing Robin [name] CPAP a few years ago.  She has had a lot of trouble with her sleep, especially over Robin [name] last year.  She tells me that she wakes up often throughout Robin [name] Robin [name] [name].  Feels anxious when she does.  She has loud snoring.  She feels very tired throughout Robin [name] Robin [name] [name].  Wakes with a dry mouth most mornings.  Denies any morning headaches, drowsy driving, sleep parasomnia/paralysis.  No history of narcolepsy or cataplexy.  No witnessed apneas She goes to bed between 8 PM to 10 PM.  Usually takes a long time to fall asleep, around 4 to 5 hours.  Wakes 3 or more times a [name].  Gets up for Robin [name] Robin [name] [name] between 7 and 11 AM.  She is currently on disability.  Gained 30 pounds over Robin [name] last 2 years.  Does not take any sleep aids. She has a history of high blood pressure, which has been difficult to control recently.  That is another reason why she was encouraged to come back for sleep evaluation.  She also has asthma, high cholesterol and  allergies. She was a former remote smoker, quit in 1998.  Drinks alcohol occasionally, usually 1 glass a month.  Drinks 1 to 2 cups of coffee a [name].  Lives alone.  Family history of asthma, heart disease, rheumatism and cancer.   [place] 16  08/07/2023: OV with Robin [name] Reasner NP for follow up after split [name] study which revealed severe sleep apnea. She feels unchanged compared to our last visit. Continues to have a lot of trouble with her sleep. Wakes often throughout Robin [name] Robin [name] [name]. Feels tired during Robin [name] Robin [name] [name]. She denies any morning headaches, drowsy driving, sleep parasomnias/paralysis. She is preparing to have weight loss [name]. They were wanting her seen and started on CPAP therapy prior to this.   10/31/2023: Today - follow up Discussed Robin [name] use of AI scribe software for clinical note transcription with Robin [name] Robin [name] [name], who gave verbal consent to proceed.  History of Present Illness   Robin [name] Robin [name] [name] approximately two weeks ago and reports a smooth recovery process. She is on a liquid diet right now but will start incorporating more solids tomorrow, which she is looking forward to. She's lost 30 lb so far. Regarding her CPAP, she has been experiencing [name] irritation from her [name], which has led to inconsistent use over Robin [name] past few weeks. Robin [name] Robin [name] [name]  [name]. She does like wearing her CPAP at [name]. She feels like she sleeps much better with it. Energy levels have been down Robin [name] last two weeks with recovery and being off CPAP. Robin [name] Robin [name] [name] reports no issues with drowsy driving. She has been using a large CPAP Dream Wear [name].       Allergies  Allergen Reactions   Lisinopril Cough    Immunization History  Administered Date(s) Administered   Influenza,inj,quad, With Preservative 09/25/2019   Influenza-Unspecified 10/20/2017   MODERNA COVID-19 SARS-COV-2 PEDS BIVALENT BOOSTER 62yr-6yr 01/07/2020, 02/04/2020   Unspecified SARS-COV-2 Vaccination 01/07/2020,  02/04/2020    Past Medical History:  Diagnosis Date   Allergy    Anxiety    Asthma    Bilateral carpal tunnel syndrome    Degeneration of lumbar intervertebral disc    Depression    DM (diabetes mellitus) (HCC)    GERD (gastroesophageal reflux disease)    Hyperlipidemia    Hypertension    OSA (obstructive sleep apnea)    Osteoarthritis of both knees    Pneumonia    Rheumatoid arthritis (HCC)    Sarcoidosis    Spinal stenosis    Vitamin D deficiency     Tobacco History: Social History   Tobacco Use  Smoking Status Former   Current packs/[name]: 0.00   Average packs/[name]: 0.1 packs/[name] for 2.0 years (0.2 ttl pk-yrs)   Types: Cigarettes   Start date: 12/25/1994   Quit date: 12/25/1996   Years since quitting: 26.8   Passive exposure: Never  Smokeless Tobacco Never  Tobacco Comments   smoked 2-3 cigs daily x 2 years   Counseling given: Not Answered Tobacco comments: smoked 2-3 cigs daily x 2 years   Outpatient Medications Prior to Visit  Medication Sig Dispense Refill   Abatacept (ORENCIA CLICKJECT) 125 MG/ML SOAJ Inject 125 mg into Robin [name] Robin [name] [name] once a week. 12 mL 0   albuterol (VENTOLIN HFA) 108 (90 Base) MCG/ACT inhaler Inhale 2 puffs into Robin [name] lungs every 6 (six) hours as needed for wheezing or shortness of breath. 18 g 2   amLODipine (NORVASC) 10 MG tablet Take 1 tablet (10 mg total) by mouth daily. 30 tablet 11   atorvastatin (LIPITOR) 20 MG tablet Take 1 tablet (20 mg total) by mouth daily. 30 tablet 11   buPROPion (WELLBUTRIN SR) 200 MG 12 hr tablet Take one tablet every morning with breakfast.  Take half  a tablet every evening     carvedilol (COREG) 12.5 MG tablet Take 1 tablet (12.5 mg total) by mouth 2 (two) times daily with a meal. 60 tablet 5   cetirizine (ZYRTEC) 10 MG tablet Take 1 tablet by mouth daily.     diclofenac Sodium (VOLTAREN) 1 % GEL Apply topically 4 (four) times daily.     dicyclomine (BENTYL) 10 MG capsule Take 10 mg by mouth 3 (three) times daily  before meals. For abdominal cramping     fluticasone (FLONASE) 50 MCG/ACT nasal spray Place into Robin [name] nose.     hydrALAZINE (APRESOLINE) 25 MG tablet 2 tablets.     HYDROcodone-acetaminophen (NORCO) 10-325 MG tablet Take 1 tablet by mouth in Robin [name] morning, at noon, and at bedtime.     Lactobacillus (ACIDOPHILUS) TABS as directed Orally     losartan-hydrochlorothiazide (HYZAAR) 100-25 MG tablet Take 1 tablet by mouth daily. 90 tablet 3   Melatonin 10 MG CAPS Take 20 mg by mouth at bedtime as needed (sleep).     metroNIDAZOLE (FLAGYL) 500 MG tablet Take 1  tablet (500 mg total) by mouth 2 (two) times daily. 14 tablet 0   omeprazole (PRILOSEC) 40 MG capsule Take 40 mg by mouth daily.     ondansetron (ZOFRAN) 8 MG tablet Take 8 mg by mouth every 8 (eight) hours as needed for nausea or vomiting.     oxyCODONE (OXY IR/ROXICODONE) 5 MG immediate release tablet Take 5 mg by mouth every 6 (six) hours as needed.     senna (SENOKOT) 8.6 MG tablet Take by mouth.     ursodiol (ACTIGALL) 250 MG tablet Take 250 mg by mouth 2 (two) times daily.     escitalopram (LEXAPRO) 20 MG tablet Take by mouth.     buPROPion (WELLBUTRIN SR) 150 MG 12 hr tablet Take 1 tablet (150 mg total) by mouth 2 (two) times daily. (Robin [name] [name] not taking: Reported on 10/31/2023) 60 tablet 11   cyclobenzaprine (FLEXERIL) 10 MG tablet Take 1 tablet (10 mg total) by mouth at bedtime as needed. (Robin [name] [name] not taking: Reported on 10/31/2023) 30 tablet 2   pantoprazole (PROTONIX) 40 MG tablet Take 1 tablet (40 mg total) by mouth daily. 30 tablet 5   traMADol (ULTRAM) 50 MG tablet Take 1 tablet (50 mg total) by mouth 3 (three) times daily as needed. 90 tablet 0   No facility-administered medications prior to visit.     Review of Systems:   Constitutional: No [name] sweats, fevers, chills, or lassitude. +daytime fatigue, weight loss HEENT: No headaches, difficulty swallowing, tooth/dental problems, or sore throat. No sneezing, itching, ear ache. +nasal  congestion CV:  No chest pain, orthopnea, PND, swelling in lower extremities, anasarca, dizziness, palpitations, syncope Resp: +snoring (resolves with CPAP); shortness of breath with exertion (baseline); occasional cough (baseline). No excess mucus or change in color of mucus. No productive or non-productive. No hemoptysis. No wheezing.  No chest wall deformity GI:  +heartburn, indigestion. No abdominal pain, nausea, vomiting, diarrhea, change in bowel habits, loss of appetite, bloody stools.  GU: No dysuria, change in color of urine, urgency or frequency.  No flank pain, no hematuria  [name]: +[name] irritation with CPAP [name]  MSK:  No joint pain or swelling.  +chronic back pain Neuro: No dizziness or lightheadedness.  Psych: +depression, anxiety; sleep disturbance (improves with CPAP). No SI/HI. [name] stable.     Physical Exam:  BP 126/80 (BP Location: Right Arm, Robin [name] [name] Position: Sitting, Cuff Size: Large)   Pulse 80   Temp 98.8 F (37.1 C) (Oral)   Ht 5\' 10"  (1.778 m)   Wt (!) 304 lb 3.2 oz (138 kg)   SpO2 98%   BMI 43.65 kg/m   GEN: Pleasant, interactive, well-appearing; obese; in no acute distress. HEENT:  Normocephalic and atraumatic. PERRLA. Sclera white. Nasal turbinates pink, moist and patent bilaterally. No rhinorrhea present. Oropharynx pink and moist, without exudate or edema. No lesions, ulcerations, or postnasal drip. Mallampati III NECK:  Supple w/ fair ROM. No JVD present. Normal carotid impulses w/o bruits. Thyroid symmetrical with no goiter or nodules palpated. No lymphadenopathy.   CV: RRR, no m/r/g, no peripheral edema. Pulses intact, +2 bilaterally. No cyanosis, pallor or clubbing. PULMONARY:  Unlabored, regular breathing. Clear bilaterally A&P w/o wheezes/rales/rhonchi. No accessory muscle use.  GI: BS present and normoactive. Soft, non-tender to palpation. No organomegaly or masses detected.  MSK: No erythema, warmth or tenderness. Cap refil <2 sec all extrem. No  deformities or joint swelling noted.  Neuro: A/Ox3. No focal deficits noted.   [name]: Warm, no lesions or rashe Psych:  Normal affect and behavior. Judgement and thought content appropriate.     Lab Results:  CBC    Component Value Date/Time   WBC 13.9 (H) 07/25/2023 1514   RBC 4.59 07/25/2023 1514   HGB 12.6 07/25/2023 1514   HGB 13.5 01/30/2022 1637   HCT 38.9 07/25/2023 1514   HCT 42.7 01/30/2022 1637   PLT 318 07/25/2023 1514   PLT 312 01/30/2022 1637   MCV 84.7 07/25/2023 1514   MCV 85 01/30/2022 1637   MCH 27.5 07/25/2023 1514   MCHC 32.4 07/25/2023 1514   RDW 11.8 07/25/2023 1514   RDW 11.8 01/30/2022 1637   LYMPHSABS 5,185 (H) 07/25/2023 1514   LYMPHSABS 4.2 (H) 01/30/2022 1637   MONOABS 1.5 (H) 04/19/2022 1015   EOSABS 445 07/25/2023 1514   EOSABS 0.3 01/30/2022 1637   BASOSABS 56 07/25/2023 1514   BASOSABS 0.0 01/30/2022 1637    BMET    Component Value Date/Time   NA 139 07/25/2023 1514   NA 143 04/04/2022 1426   K 3.9 07/25/2023 1514   CL 101 07/25/2023 1514   CO2 27 07/25/2023 1514   GLUCOSE 88 07/25/2023 1514   BUN 15 07/25/2023 1514   BUN 15 04/04/2022 1426   CREATININE 0.84 07/25/2023 1514   CALCIUM 9.4 07/25/2023 1514   GFRNONAA >60 04/19/2022 1015   GFRAA  10/12/2009 0600    >60        Robin [name] eGFR has been calculated using Robin [name] MDRD equation. This calculation has not been validated in all clinical situations. eGFR's persistently <60 mL/min signify possible Chronic Kidney Disease.    BNP No results found for: "BNP"   Imaging:  No results found.  Administration History     None           No data to display          No results found for: "NITRICOXIDE"      Assessment & Plan:      Post-Gastric Sleeve [name] Recovery/Obesity Recovering well from gastric bypass [name] performed two weeks ago. Currently on a liquid diet, transitioning to solid foods soon. Overall positive progress with a weight loss of 30 pounds. -  Continue dietary progression as planned - Monitor weight loss and dietary intake - Follow up with bariatric clinic as scheduled   Obstructive Sleep Apnea (OSA) Severe OSA on CPAP. Reports [name] irritation from Robin [name] CPAP [name], leading to inconsistent use post-[name]. Energy levels variable, likely due to recovery and current liquid diet. She does receive benefit from CPAP use. Aware of risks of untreated OSA. Suggested [name] [name] or memory foam [name] as alternatives to alleviate [name] irritation. Discussed potential need to adjust CPAP pressures and [name] size as she progresses in her weight loss journey. Safe driving practices reviewed.  - Continue current CPAP settings - Order [name] [name] for Dreamwear full face [name] online  - Consider memory foam [name] if [name] are ineffective - Monitor for [name] fit issues due to weight loss and adjust size if necessary - Follow up in 6-8 weeks to reassess [name] and CPAP pressures  Follow-up - Schedule follow-up appointment in 6-8 weeks to reassess CPAP use and [name] fit - Monitor and report any issues with CPAP pressures or [name] fit.         I spent 32 minutes of dedicated to Robin [name] care of this Robin [name] [name] on Robin [name] date of this encounter to include pre-visit review of records, face-to-face time with Robin [name] Robin [name] [name] discussing conditions above, post visit ordering  of testing, clinical documentation with Robin [name] electronic health record, making appropriate referrals as documented, and communicating necessary findings to members of Robin [name] patients care team.  Noemi Chapel, NP 10/31/2023  Pt aware and understands NP's role.

## 2023-11-01 ENCOUNTER — Encounter: Payer: Self-pay | Admitting: Internal Medicine

## 2023-11-01 ENCOUNTER — Ambulatory Visit: Payer: Medicaid Other | Attending: Orthopedic Surgery | Admitting: Internal Medicine

## 2023-11-01 ENCOUNTER — Other Ambulatory Visit (HOSPITAL_COMMUNITY): Payer: Self-pay

## 2023-11-01 ENCOUNTER — Other Ambulatory Visit: Payer: Self-pay

## 2023-11-01 VITALS — BP 118/83 | HR 69 | Resp 16 | Ht 70.0 in | Wt 302.0 lb

## 2023-11-01 DIAGNOSIS — Z79899 Other long term (current) drug therapy: Secondary | ICD-10-CM | POA: Diagnosis not present

## 2023-11-01 DIAGNOSIS — M0609 Rheumatoid arthritis without rheumatoid factor, multiple sites: Secondary | ICD-10-CM

## 2023-11-01 DIAGNOSIS — M25511 Pain in right shoulder: Secondary | ICD-10-CM

## 2023-11-01 DIAGNOSIS — G5603 Carpal tunnel syndrome, bilateral upper limbs: Secondary | ICD-10-CM | POA: Diagnosis not present

## 2023-11-01 DIAGNOSIS — M25512 Pain in left shoulder: Secondary | ICD-10-CM

## 2023-11-01 DIAGNOSIS — F3341 Major depressive disorder, recurrent, in partial remission: Secondary | ICD-10-CM | POA: Insufficient documentation

## 2023-11-01 DIAGNOSIS — G8929 Other chronic pain: Secondary | ICD-10-CM

## 2023-11-01 MED ORDER — ORENCIA CLICKJECT 125 MG/ML ~~LOC~~ SOAJ
125.0000 mg | SUBCUTANEOUS | 0 refills | Status: DC
  Filled 2023-11-01: qty 12, 84d supply, fill #0
  Filled 2023-11-16: qty 4, 28d supply, fill #0
  Filled 2023-12-11: qty 4, 28d supply, fill #1
  Filled 2024-01-14: qty 4, 28d supply, fill #2

## 2023-11-16 ENCOUNTER — Other Ambulatory Visit: Payer: Self-pay

## 2023-11-16 NOTE — Progress Notes (Signed)
Specialty Pharmacy Refill Coordination Note  Robin [name] is a 52 y.o. female contacted today regarding refills of specialty medication(s) Abatacept   Patient requested Delivery   Delivery date: 11/21/23   Verified address: [address] [place] [address]   Medication will be filled on 11/20/23.

## 2023-11-20 ENCOUNTER — Other Ambulatory Visit: Payer: Self-pay

## 2023-12-11 ENCOUNTER — Other Ambulatory Visit: Payer: Self-pay

## 2023-12-11 NOTE — Progress Notes (Signed)
Specialty Pharmacy Refill Coordination Note  Robin [name] is a 52 y.o. female contacted today regarding refills of specialty medication(s) Abatacept (Orencia ClickJect)   Patient requested Delivery   Delivery date: 12/14/23   Verified address: [address] [place] [address]   Medication will be filled on 12.19.24.

## 2024-01-03 ENCOUNTER — Ambulatory Visit: Payer: Medicaid Other | Admitting: Nurse Practitioner

## 2024-01-10 ENCOUNTER — Other Ambulatory Visit: Payer: Self-pay

## 2024-01-14 ENCOUNTER — Other Ambulatory Visit: Payer: Self-pay

## 2024-01-14 ENCOUNTER — Other Ambulatory Visit (HOSPITAL_COMMUNITY): Payer: Self-pay

## 2024-01-14 ENCOUNTER — Encounter (HOSPITAL_COMMUNITY): Payer: Self-pay

## 2024-01-14 NOTE — Progress Notes (Signed)
Specialty Pharmacy Refill Coordination Note  Robin [name] is a 53 y.o. female contacted today regarding refills of specialty medication(s) Abatacept (Orencia ClickJect)   Patient requested (Patient-Rptd) Delivery   Delivery date: (Patient-Rptd) 01/18/24   Verified address: (Patient-Rptd) [address]   Medication will be filled on 01.23.25.

## 2024-01-16 ENCOUNTER — Other Ambulatory Visit: Payer: Self-pay

## 2024-01-16 NOTE — Progress Notes (Signed)
Specialty Pharmacy Ongoing Clinical Assessment Note  Robin [name] is a 53 y.o. female who is being followed by the specialty pharmacy service for RxSp Rheumatoid Arthritis   Patient's specialty medication(s) reviewed today: Abatacept (Orencia ClickJect)   Missed doses in the last 4 weeks: 0   Patient/Caregiver did not have any additional questions or concerns.   Therapeutic benefit summary: Unable to assess  Patient stated she is unsure if her Dub Amis is not working or the cold weather is exacerbating her RA symptoms. She remains adherent to Orencia and takes injections each Wednesday.   Adverse events/side effects summary: No adverse events/side effects   Patient's therapy is appropriate to: Continue    Goals Addressed             This Visit's Progress    Minimize recurrence of flares       Patient is on track. Patient will maintain adherence         Follow up:  6 months  Canisha Issac [facility] Specialty Pharmacist

## 2024-01-17 ENCOUNTER — Other Ambulatory Visit: Payer: Self-pay

## 2024-01-22 NOTE — Progress Notes (Signed)
 Office Visit Note  Patient: Robin [name]             Date of Birth: 1971-09-18           MRN: [id number]             PCP: Quita Skye, PA-C Referring: Quita Skye, PA-C Visit Date: 02/04/2024   Subjective:  Follow-up (Patient states she feels the [place] is not working. Patient states her pain is putting her deeper into depression. Patient states she feels she cannot do a lot and the cold outside is horrible for her. Patient states all of her joints are hurting her. Patient states she cannot sleep at night. )   Discussed the use of AI scribe software for clinical note transcription with the patient, who gave verbal consent to proceed.  History of Present Illness   Robin [name] is a 53 y.o. female here for follow up for seronegative RA now on Orencia 125 mg subcu weekly and OA..    She experiences widespread pain, particularly in her back, shoulders, elbows, and hands, with radiation up her arms and legs. The pain is sharp, constant, and exacerbated by cold weather, causing her body to 'lock up' when stepping outside. She reports continuous and severe pain down to her bones and muscles.  She has bilateral carpal tunnel syndrome contributing to hand pain, with swelling described as 'like a mitten' and occasional knee swelling after walking. She experiences tingling and a 'falling asleep sensation' in her hands at night, despite wearing wrist braces. No specific treatments for carpal tunnel syndrome have been tried.  She has difficulty sleeping due to pain, despite using 'sleepy time tea,' and sleeps no more than four hours a night in short intervals. Various medications have been tried for pain, including gabapentin, which was discontinued due to side effects, and Lyrica, stopped due to insurance changes. She is currently taking Wellbutrin for depression and anxiety.  She has been attempting to exercise at the [facility] but finds the pain overwhelming. Prednisone provides  some relief, though not sufficient. She previously used hydrocodone for back pain but stopped due to withdrawal symptoms.  She is scheduled to see her orthopedic specialist for a knee replacement consultation, as her weight has decreased sufficiently for the procedure.   Previous HPI 11/01/2023 Robin [name] is a 53 y.o. female here for follow up for seronegative RA now on Orencia 125 mg subcu weekly.  She is currently holding her medication due to going for laparoscopic gastrectomy on October 22.  She is asking today whether she can resume her medication.  She did notice within 1 to 2 weeks of skipping Orencia starting to have increased joint pain especially in her right hand.  Also reports increased right shoulder pain that is most severe at night and with walking using a cane.  Surgery was uneventful she has been progressing with her diet.  One of the portals took longer than others to heal but has now scabbed over since 2 or 3 days ago.  Still has more symptoms from bilateral knee pain with weightbearing limits her to walking for only a few minutes at a time and uses cane for offloading.   07/30/2023 Robin [name] is a 53 y.o. female here for follow up for seronegative RA now on Orencia 125 mg subcu weekly.  She not had any major flareup with joint pain or swelling.  Is noticing some ongoing swelling worst around the base of the thumb on left  and right hand occasionally involving the second finger as well.  She saw her [facility] provider for pain in both shoulders and across the upper back.  Had x-rays for this and had a left shoulder steroid injection with Dr. Ranell Patrick last week.  So far this is beneficial discussed plan to try on the right side for this in a week or 2 if it is continuing to help.  She is walking with cane for assistance for bilateral knee pain this sometimes causes her right elbow to hurt a little bit.   04/25/2023 Robin [name] is a 53 y.o. female here for follow  up for seronegative RA now on Orencia 125 mg subcu weekly.  She started with first dose of medication at pharmacy clinic appointment on March 6.  Has not experienced any difficulty with the medication or injection site reactions.  Labs checked from April 11 looks fine no problems with new medication start.  So far feels pretty good with this though she still has significant ongoing joint pain.  Worst complaint today in her shoulders and at the right elbow.   02/19/23 Robin [name] is a 53 y.o. female here for follow up for seronegative RA on Humira 40 mg subcu q. 14 days.  We just met last month discussed possibly switching to Actemra due to decreased Humira efficacy.  However after reviewing side effect risks in more detail she is very concerned about this medication and was to look at other options.  Including possible side effects affecting her blood pressure or pancreatitis or liver function.   01/10/23 Robin [name] is a 53 y.o. female here for follow up for seronegative RA on Humira 40 mg [place] q14days. Since our last visit she has a lot of ongoing joint pain her worst affected joints are in both knees where she has known severe osteoarthritis.  She is planned for completing series of visco supplementation injections. This is ongoing as a temporizing measure she is not currently candidate for joint replacement surgery due to morbid obesity. She is seeing weight loss clinic for this.  Besides that she still having some ongoing joint pain and swelling in her upper extremities and increase in facial rashes.   12/05/22 Robin [name] is a 53 y.o. female here for follow up for seronegative RA on Humira 40 mg [place] q14days. She had updated labs checked in family medicine clinic November with negative RA serology and normal inflammatory markers. Some improvement in swelling with Humira but continues to have a lot of pain on a daily basis, worst in knees with known OA also in both hands, right  elbow, left shoulder worse. She had injections to her knees with only a few days of benefit. On discussion of lab findings and notes reviewed, patient reports previously thought to have sarcoid with abnormal CXR and eye inflammation problems but was not on disease specific treatment at that time. She also had abnormal labs concerning for lupus for years when younger does not recall the exact details.     Labs reviewed 10/26/22 ANA pos RF neg CCP neg ESR wnl CRP wnl Vit D 1,25 wnl   Previous HPI 04/18/22 Robin [name] is a 53 y.o. female here for seronegative RA, previously a patient with Dr. Deanne Coffer. She was taking Humira previously interrupted due to loss of coverage and leflunomide with questionable response in symptoms. She was diagnosed after visit in 11/2019 with multiple areas of joint pain and frequent swelling particularly in bilateral wrists.  Laboratory work-up at that time including rheumatoid factor, CCP, ACE, HLA-B27, sed rate, and CRP were all negative.  She was started on methotrexate and the leflunomide with lack of response in her symptoms.  She started on Humira for this and took it for a few months but was then off the medication for at least about a year due to change in insurance status after losing her job.  She restarted this since 2 months ago so far has not seen a very impressive change in her symptoms.  She is significantly limited by severe lower back pain with degenerative arthritis pursuing disability for this.  Also has advanced osteoarthritis of bilateral knees.  Shoulder pain limits overhead movement or worsened with pressure.  Gets chronic hand pain worse around the thumbs but especially on the radial side of her wrist with fairly chronic swelling currently worse than normal on the right side.  She has had local steroid joint injections in multiple areas but none in the past year these were typically beneficial for at least weeks in the past.  For her osteoarthritis  pain she takes diclofenac twice daily with a pretty good benefit has not experienced any side effects or problems taking this medicine.    DMARD Hx Orencia - Nonresponder Humira - loss of response MTX and LEF - inadequate response   Review of Systems  Constitutional:  Positive for fatigue.  HENT:  Positive for mouth dryness. Negative for mouth sores.   Eyes:  Positive for dryness.  Respiratory:  Positive for shortness of breath.   Cardiovascular:  Negative for chest pain and palpitations.  Gastrointestinal:  Positive for constipation. Negative for blood in stool and diarrhea.  Endocrine: Negative for increased urination.  Genitourinary:  Negative for involuntary urination.  Musculoskeletal:  Positive for joint pain, gait problem, joint pain, joint swelling, myalgias, muscle weakness, morning stiffness, muscle tenderness and myalgias.  Skin:  Positive for hair loss and sensitivity to sunlight. Negative for color change and rash.  Allergic/Immunologic: Negative for susceptible to infections.  Neurological:  Negative for dizziness and headaches.  Hematological:  Negative for swollen glands.  Psychiatric/Behavioral:  Positive for depressed mood and sleep disturbance. The patient is nervous/anxious.     PMFS History:  Patient Active Problem List   Diagnosis Date Noted   Myofascial pain 02/04/2024   Recurrent major depressive disorder, in partial remission (HCC) 11/01/2023   Status post gastric surgery 10/31/2023   Bilateral shoulder pain 04/25/2023   Dysphagia 04/17/2023   Gastroesophageal reflux disease without esophagitis 04/17/2023   Colon cancer screening 04/17/2023   Constipation 04/17/2023   Bilateral carpal tunnel syndrome 03/12/2023   Positive ANA (antinuclear antibody) 12/05/2022   Vitamin D deficiency 11/27/2022   High risk medication use 04/19/2022   Financial insecurity 02/13/2022   Anxiety and depression 01/30/2022   Hypercholesterolemia 01/30/2022   Primary  hypertension 01/30/2022   Rheumatoid arthritis of multiple sites with negative rheumatoid factor (HCC) 01/30/2022   Osteoarthritis of left knee 06/01/2021   Lumbar spinal stenosis status post L2-L3 TLIF 05/05/2021   Degenerative scoliosis 04/18/2021   Sarcoidosis 05/10/2020   Pain of left hip joint 03/24/2020   Cervical spondylosis without myelopathy 07/16/2019   Neck pain 07/16/2019   Obesity, Class III, BMI 40-49.9 (morbid obesity) (HCC) 10/18/2018   Primary osteoarthritis of both knees 10/18/2018   Hammertoe of right foot 07/02/2018   OSA (obstructive sleep apnea) 10/26/2014    Past Medical History:  Diagnosis Date   Allergy    Anxiety  Asthma    Bilateral carpal tunnel syndrome    Degeneration of lumbar intervertebral disc    Depression    DM (diabetes mellitus) (HCC)    GERD (gastroesophageal reflux disease)    Hyperlipidemia    Hypertension    OSA (obstructive sleep apnea)    Osteoarthritis of both knees    Pneumonia    Rheumatoid arthritis (HCC)    Sarcoidosis    Spinal stenosis    Vitamin D deficiency     Family History  Problem Relation Age of Onset   Colon polyps Mother    Colon cancer Neg Hx    Stomach cancer Neg Hx    Esophageal cancer Neg Hx    Rectal cancer Neg Hx    Past Surgical History:  Procedure Laterality Date   ABDOMINAL HYSTERECTOMY     DEBRIDEMENT AND CLOSURE WOUND N/A 06/15/2021   Procedure: Irrigation and debridement and closure of spine wound;  Surgeon: Venita Lick, MD;  Location: [facility] OR;  Service: Orthopedics;  Laterality: N/A;   ESOPHAGOGASTRODUODENOSCOPY     FOOT SURGERY Bilateral    Both surgeries for bunions by Dr. Wynelle Cleveland per pt   gastric sleeve surgery  10/16/2023   TRANSFORAMINAL LUMBAR INTERBODY FUSION (TLIF) WITH PEDICLE SCREW FIXATION 1 LEVEL N/A 05/05/2021   Procedure: TRANSFORAMINAL LUMBAR INTERBODY FUSION (TLIF) WITH PEDICLE SCREW FIXATION 1 LEVEL L2-3;  Surgeon: Venita Lick, MD;  Location: [facility] OR;  Service:  Orthopedics;  Laterality: N/A;  4 hrs   Social History   Social History Narrative   Not on file   Immunization History  Administered Date(s) Administered   Influenza,inj,quad, With Preservative 09/25/2019   Influenza-Unspecified 10/20/2017   MODERNA COVID-19 SARS-COV-2 PEDS BIVALENT BOOSTER 34yr-32yr 01/07/2020, 02/04/2020   Unspecified SARS-COV-2 Vaccination 01/07/2020, 02/04/2020     Objective: Vital Signs: BP 122/77 (BP Location: Left Arm, Patient Position: Sitting, Cuff Size: Large)   Pulse 75   Resp 16   Ht 5\' 10"  (1.778 m)   Wt 280 lb (127 kg)   BMI 40.18 kg/m    Physical Exam   Musculoskeletal Exam:  Shoulders abduction limited at slightly above horizontal with guarding, pain worse in active movement Tenderness to pressure around the shoulders and over trapezius muscles bilaterally Elbows full ROM no tenderness or swelling Right wrist tenderness to pressure, range of motion is intact Fingers full ROM, tenderness to pressure at first [facility] joint of both hands with no palpable synovitis Lateral hip pain on rotation and to pressure b/l Knees full ROM joint line tenderness to pressure much worse on right than left, patellofemoral crepitus, no palpable effusions  Investigation: No additional findings.  Imaging: No results found.  Recent Labs: Lab Results  Component Value Date   WBC 13.9 (H) 07/25/2023   HGB 12.6 07/25/2023   PLT 318 07/25/2023   NA 139 07/25/2023   K 3.9 07/25/2023   CL 101 07/25/2023   CO2 27 07/25/2023   GLUCOSE 88 07/25/2023   BUN 15 07/25/2023   CREATININE 0.84 07/25/2023   BILITOT 0.4 07/25/2023   ALKPHOS 96 04/19/2022   AST 20 07/25/2023   ALT 22 07/25/2023   PROT 6.8 07/25/2023   ALBUMIN 3.5 04/19/2022   CALCIUM 9.4 07/25/2023   GFRAA  10/12/2009    >60        The eGFR has been calculated using the MDRD equation. This calculation has not been validated in all clinical situations. eGFR's persistently <60 mL/min  signify possible Chronic Kidney Disease.   QFTBGOLDPLUS  NEGATIVE 04/05/2023    Speciality Comments: No specialty comments available.  Procedures:  No procedures performed Allergies: Lisinopril   Assessment / Plan:     Visit Diagnoses: Rheumatoid arthritis of multiple sites with negative rheumatoid factor (HCC) - Plan: Abatacept (ORENCIA CLICKJECT) 125 MG/ML SOAJ Persistent widespread pain despite Orencia treatment. No significant joint inflammation observed on examination. Inflammatory markers improved. -Continue current orencia 125 mg [place] weekly -Consider adding a medication for widespread joint and muscle pain.  High risk medication use - Orencia at 125 mg subcu weekly - Plan: Abatacept (ORENCIA CLICKJECT) 125 MG/ML SOAJ  Chronic pain of both shoulders  Myofascial pain - Plan: cyclobenzaprine (FLEXERIL) 10 MG tablet, pregabalin (LYRICA) 75 MG capsule  Reports of pain in hands and arms, possibly related to bilateral carpal tunnel syndrome. -Start Lyrica 75mg  twice daily for nerve pain. Monitor for side effects similar to previous experience with Gabapentin.   High risk medication use - Plan: Abatacept (ORENCIA CLICKJECT) 125 MG/ML SOAJ Recent labs reviewed blood count and metabolic panel appropriate. No serious interval infection suggestive for medicine side effect.  Insomnia Difficulty sleeping due to pain. -Will try adding flexeril at bedtime PRN  Osteoarthritis Reports of sharp pain in knees, especially in cold weather. Scheduled for knee replacement surgery due to weight loss. -Continue seeing orthopedist for management.  Back Pain Previously managed with hydrocodone, but patient stopped due to withdrawal symptoms. -Consider non-opioid pain management strategies.   Orders: No orders of the defined types were placed in this encounter.  Meds ordered this encounter  Medications   cyclobenzaprine (FLEXERIL) 10 MG tablet    Sig: Take 1 tablet (10 mg total) by mouth  at bedtime.    Dispense:  30 tablet    Refill:  2   pregabalin (LYRICA) 75 MG capsule    Sig: Take 1 capsule (75 mg total) by mouth 2 (two) times daily.    Dispense:  60 capsule    Refill:  2   Abatacept (ORENCIA CLICKJECT) 125 MG/ML SOAJ    Sig: Inject 125 mg into the skin once a week.    Dispense:  12 mL    Refill:  0    Prescription Type::   Renewal     Follow-Up Instructions: Return in about 3 months (around 05/03/2024) for RA/OA/FMS on ABA/Lyrica start.   Fuller Plan, MD  Note - This record has been created using [employer].  Chart creation errors have been sought, but may not always  have been located. Such creation errors do not reflect on  the standard of medical care.

## 2024-02-04 ENCOUNTER — Encounter: Payer: Self-pay | Admitting: Internal Medicine

## 2024-02-04 ENCOUNTER — Other Ambulatory Visit (HOSPITAL_COMMUNITY): Payer: Self-pay

## 2024-02-04 ENCOUNTER — Other Ambulatory Visit: Payer: Self-pay

## 2024-02-04 ENCOUNTER — Ambulatory Visit: Payer: Medicaid Other | Attending: Orthopedic Surgery | Admitting: Internal Medicine

## 2024-02-04 VITALS — BP 122/77 | HR 75 | Resp 16 | Ht 70.0 in | Wt 280.0 lb

## 2024-02-04 DIAGNOSIS — G8929 Other chronic pain: Secondary | ICD-10-CM | POA: Insufficient documentation

## 2024-02-04 DIAGNOSIS — M0609 Rheumatoid arthritis without rheumatoid factor, multiple sites: Secondary | ICD-10-CM | POA: Insufficient documentation

## 2024-02-04 DIAGNOSIS — D869 Sarcoidosis, unspecified: Secondary | ICD-10-CM | POA: Insufficient documentation

## 2024-02-04 DIAGNOSIS — Z79899 Other long term (current) drug therapy: Secondary | ICD-10-CM | POA: Diagnosis not present

## 2024-02-04 DIAGNOSIS — M25511 Pain in right shoulder: Secondary | ICD-10-CM | POA: Diagnosis not present

## 2024-02-04 DIAGNOSIS — M25512 Pain in left shoulder: Secondary | ICD-10-CM | POA: Insufficient documentation

## 2024-02-04 DIAGNOSIS — M7918 Myalgia, other site: Secondary | ICD-10-CM | POA: Insufficient documentation

## 2024-02-04 MED ORDER — ORENCIA CLICKJECT 125 MG/ML ~~LOC~~ SOAJ
125.0000 mg | SUBCUTANEOUS | 0 refills | Status: DC
  Filled 2024-02-04: qty 12, 84d supply, fill #0
  Filled 2024-02-13: qty 4, 28d supply, fill #0
  Filled 2024-03-12: qty 4, 28d supply, fill #1

## 2024-02-04 MED ORDER — CYCLOBENZAPRINE HCL 10 MG PO TABS
10.0000 mg | ORAL_TABLET | Freq: Every day | ORAL | 2 refills | Status: DC
Start: 2024-02-04 — End: 2024-04-28

## 2024-02-04 MED ORDER — PREGABALIN 75 MG PO CAPS
75.0000 mg | ORAL_CAPSULE | Freq: Two times a day (BID) | ORAL | 2 refills | Status: DC
Start: 2024-02-04 — End: 2024-04-28

## 2024-02-06 ENCOUNTER — Encounter: Payer: Self-pay | Admitting: Nurse Practitioner

## 2024-02-06 ENCOUNTER — Ambulatory Visit (INDEPENDENT_AMBULATORY_CARE_PROVIDER_SITE_OTHER): Payer: Medicaid Other | Admitting: Nurse Practitioner

## 2024-02-06 VITALS — BP 108/72 | HR 77 | Temp 98.4°F | Ht 70.0 in | Wt 285.2 lb

## 2024-02-06 DIAGNOSIS — G4733 Obstructive sleep apnea (adult) (pediatric): Secondary | ICD-10-CM

## 2024-02-06 NOTE — Patient Instructions (Signed)
Continue to use CPAP every night, minimum of 4-6 hours a night.  Change equipment as directed. Wash your tubing with warm soap and water daily, hang to dry. Wash humidifier portion weekly. Use bottled, distilled water and change daily Be aware of reduced alertness and do not drive or operate heavy machinery if experiencing this or drowsiness.  Exercise encouraged, as tolerated. Healthy weight management discussed.  Avoid or decrease alcohol consumption and medications that make you more sleepy, if possible. Notify if persistent daytime sleepiness occurs even with consistent use of PAP therapy.  I am going to adjust your settings to 8-12 cmH2O. If you still have leaks with this, you may need your mask resized. Call [employer] to schedule an appointment with them  [facility] job on your weight loss journey!  We will continue to monitor your symptoms, weight loss and CPAP tolerance. At some point, we may repeat a sleep study to reassess severity but so far, CPAP seems to be benefiting you so recommend continuing   Follow up in 6 months with Robin Enzo Treu,NP. If symptoms do not improve or worsen, please contact office for sooner follow up

## 2024-02-06 NOTE — Assessment & Plan Note (Signed)
Severe OSA on CPAP. Good compliance and control. Difficulties with pressure settings and some moderate leaks. She's had significant weight loss (50 lb) since her sleep study. Will decrease her pressure settings to 8-12 cmH2O (95th 12.5). Advised her to monitor response. May need mask refit - aware to contact DME to resize/try alternative mask if leaks persist with pressure change. She does receive benefit from use. Encouraged continued healthy weight loss measures. Aware of risks of untreated OSA. Safe driving practices reviewed.   Patient Instructions  Continue to use CPAP every night, minimum of 4-6 hours a night.  Change equipment as directed. Wash your tubing with warm soap and water daily, hang to dry. Wash humidifier portion weekly. Use bottled, distilled water and change daily Be aware of reduced alertness and do not drive or operate heavy machinery if experiencing this or drowsiness.  Exercise encouraged, as tolerated. Healthy weight management discussed.  Avoid or decrease alcohol consumption and medications that make you more sleepy, if possible. Notify if persistent daytime sleepiness occurs even with consistent use of PAP therapy.  I am going to adjust your settings to 8-12 cmH2O. If you still have leaks with this, you may need your mask resized. Call [employer] to schedule an appointment with them  [facility] job on your weight loss journey!  We will continue to monitor your symptoms, weight loss and CPAP tolerance. At some point, we may repeat a sleep study to reassess severity but so far, CPAP seems to be benefiting you so recommend continuing   Follow up in 6 months with Robin Jamisen Hawes,NP. If symptoms do not improve or worsen, please contact office for sooner follow up

## 2024-02-06 NOTE — Assessment & Plan Note (Signed)
BMI 40. Continued healthy weight loss encouraged

## 2024-02-06 NOTE — Progress Notes (Signed)
@Patient  ID: Robin [name], female    DOB: 29-Dec-1970, 53 y.o.   MRN: [id number]  Chief Complaint  Patient presents with   Follow-up    Referring provider: Quita Skye, PA-C  HPI: 53 year old female, former remote smoker followed for severe OSA. Past medical history significant for HTN, OSA not on CPAP, RA, anxiety, depression, vit d deficiency.   TEST/EVENTS:  2010 NPSG: AHI 8/h 06/12/2023 Split Night Study: AHI 30.7/h, SpO2 low 88% >> optimal pressure 14 cmH2O; weight 330 lb  02/15/2023: Ov with Sonnie Bias NP for sleep consult, referred by Dr. Wyline Mood.  She was previously seen in our office by Dr. Stann Mainland in 2015.  She had mild obstructive sleep apnea diagnosed in 2010.  She was started on CPAP therapy and had been on it for many years.  At some point, she lost her insurance and stopped receiving supplies so she stopped wearing the CPAP a few years ago.  She has had a lot of trouble with her sleep, especially over the last year.  She tells me that she wakes up often throughout the night.  Feels anxious when she does.  She has loud snoring.  She feels very tired throughout the day.  Wakes with a dry mouth most mornings.  Denies any morning headaches, drowsy driving, sleep parasomnia/paralysis.  No history of narcolepsy or cataplexy.  No witnessed apneas She goes to bed between 8 PM to 10 PM.  Usually takes a long time to fall asleep, around 4 to 5 hours.  Wakes 3 or more times a night.  Gets up for the day between 7 and 11 AM.  She is currently on disability.  Gained 30 pounds over the last 2 years.  Does not take any sleep aids. She has a history of high blood pressure, which has been difficult to control recently.  That is another reason why she was encouraged to come back for sleep evaluation.  She also has asthma, high cholesterol and allergies. She was a former remote smoker, quit in 1998.  Drinks alcohol occasionally, usually 1 glass a month.  Drinks 1 to 2 cups of coffee a day.  Lives  alone.  Family history of asthma, heart disease, rheumatism and cancer.   [place] 16  08/07/2023: OV with Austen Oyster NP for follow up after split night study which revealed severe sleep apnea. She feels unchanged compared to our last visit. Continues to have a lot of trouble with her sleep. Wakes often throughout the night. Feels tired during the day. She denies any morning headaches, drowsy driving, sleep parasomnias/paralysis. She is preparing to have weight loss surgery. They were wanting her seen and started on CPAP therapy prior to this.   10/31/2023: OV with Topaz Raglin NP.  The patient underwent gastric bypass surgery approximately two weeks ago and reports a smooth recovery process. She is on a liquid diet right now but will start incorporating more solids tomorrow, which she is looking forward to. She's lost 30 lb so far. Regarding her CPAP, she has been experiencing skin irritation from her mask, which has led to inconsistent use over the past few weeks. The patient has not yet tried any mask liners. She does like wearing her CPAP at night. She feels like she sleeps much better with it. Energy levels have been down the last two weeks with recovery and being off CPAP. The patient reports no issues with drowsy driving. She has been using a large CPAP Dream Wear mask.   02/06/2024:  Today - follow up Patient presents today for follow up. She has been back on CPAP and doing well on CPAP since her last visit for the most part. She has continued to lose weight and is down another 20 lb. She feels like sometimes her mask blows too hard, which results in her removing it at night. She does feel like when she wears the CPAP, she sleeps better and wakes up more refreshed. Energy levels are better. She is wearing a full face mask - irritation subsided with mask liners. Denies any drowsy driving or sleep parasomnias/paralysis.  01/06/2024-02/04/2024: CPAP 12-16 cmH2O 28/30 days; 73% >4 hr; average use 5 hr 45 min Pressure  95th 12.5 Leaks 95th 30.5 AHI 2.7  Allergies  Allergen Reactions   Lisinopril Cough    Immunization History  Administered Date(s) Administered   Influenza,inj,quad, With Preservative 09/25/2019   Influenza-Unspecified 10/20/2017   MODERNA COVID-19 SARS-COV-2 PEDS BIVALENT BOOSTER 49yr-53yr 01/07/2020, 02/04/2020   Unspecified SARS-COV-2 Vaccination 01/07/2020, 02/04/2020    Past Medical History:  Diagnosis Date   Allergy    Anxiety    Asthma    Bilateral carpal tunnel syndrome    Degeneration of lumbar intervertebral disc    Depression    DM (diabetes mellitus) (HCC)    GERD (gastroesophageal reflux disease)    Hyperlipidemia    Hypertension    OSA (obstructive sleep apnea)    Osteoarthritis of both knees    Pneumonia    Rheumatoid arthritis (HCC)    Sarcoidosis    Spinal stenosis    Vitamin D deficiency     Tobacco History: Social History   Tobacco Use  Smoking Status Former   Current packs/day: 0.00   Average packs/day: 0.1 packs/day for 2.0 years (0.2 ttl pk-yrs)   Types: Cigarettes   Start date: 12/25/1994   Quit date: 12/25/1996   Years since quitting: 27.1   Passive exposure: Never  Smokeless Tobacco Never  Tobacco Comments   smoked 2-3 cigs daily x 2 years   Counseling given: Not Answered Tobacco comments: smoked 2-3 cigs daily x 2 years   Outpatient Medications Prior to Visit  Medication Sig Dispense Refill   Abatacept (ORENCIA CLICKJECT) 125 MG/ML SOAJ Inject 125 mg into the skin once a week. 12 mL 0   albuterol (VENTOLIN HFA) 108 (90 Base) MCG/ACT inhaler Inhale 2 puffs into the lungs every 6 (six) hours as needed for wheezing or shortness of breath. 18 g 2   amLODipine (NORVASC) 10 MG tablet Take 1 tablet (10 mg total) by mouth daily. 30 tablet 11   atorvastatin (LIPITOR) 20 MG tablet Take 1 tablet (20 mg total) by mouth daily. 30 tablet 11   buPROPion (WELLBUTRIN SR) 200 MG 12 hr tablet Take one tablet every morning with breakfast.  Take half  a  tablet every evening     carvedilol (COREG) 12.5 MG tablet Take 1 tablet (12.5 mg total) by mouth 2 (two) times daily with a meal. 60 tablet 5   cyclobenzaprine (FLEXERIL) 10 MG tablet Take 1 tablet (10 mg total) by mouth at bedtime. 30 tablet 2   diclofenac Sodium (VOLTAREN) 1 % GEL Apply topically 4 (four) times daily.     ESTROGENS CONJUGATED PO Take by mouth.     fluticasone (FLONASE) 50 MCG/ACT nasal spray Place into the nose.     GAVILAX 17 GM/SCOOP powder SMARTSIG:17 Gram(s) By Mouth Every Night PRN     hydrALAZINE (APRESOLINE) 25 MG tablet 2 tablets.  losartan-hydrochlorothiazide (HYZAAR) 100-25 MG tablet Take 1 tablet by mouth daily. 90 tablet 3   Melatonin 10 MG CAPS Take 20 mg by mouth at bedtime as needed (sleep).     omeprazole (PRILOSEC) 40 MG capsule Take 40 mg by mouth daily.     pregabalin (LYRICA) 75 MG capsule Take 1 capsule (75 mg total) by mouth 2 (two) times daily. 60 capsule 2   ursodiol (ACTIGALL) 250 MG tablet Take 250 mg by mouth 2 (two) times daily.     cetirizine (ZYRTEC) 10 MG tablet Take 1 tablet by mouth daily.     dicyclomine (BENTYL) 10 MG capsule Take 10 mg by mouth 3 (three) times daily before meals. For abdominal cramping     escitalopram (LEXAPRO) 20 MG tablet Take by mouth.     HYDROcodone-acetaminophen (NORCO) 10-325 MG tablet Take 1 tablet by mouth in the morning, at noon, and at bedtime. (Patient not taking: Reported on 02/06/2024)     Lactobacillus (ACIDOPHILUS) TABS as directed Orally (Patient not taking: Reported on 02/06/2024)     metroNIDAZOLE (FLAGYL) 500 MG tablet Take 1 tablet (500 mg total) by mouth 2 (two) times daily. (Patient not taking: Reported on 02/06/2024) 14 tablet 0   ondansetron (ZOFRAN) 8 MG tablet Take 8 mg by mouth every 8 (eight) hours as needed for nausea or vomiting. (Patient not taking: Reported on 02/06/2024)     No facility-administered medications prior to visit.     Review of Systems:   Constitutional: No night sweats,  fevers, chills, or lassitude. +daytime fatigue (improved), intentional weight loss HEENT: No headaches, difficulty swallowing, tooth/dental problems, or sore throat. No sneezing, itching, ear ache. +nasal congestion (baseline) CV:  No chest pain, orthopnea, PND, swelling in lower extremities, anasarca, dizziness, palpitations, syncope Resp: +shortness of breath with exertion (baseline); occasional cough (baseline). No excess mucus or change in color of mucus. No productive or non-productive. No hemoptysis. No wheezing.  No chest wall deformity GI:  No heartburn, indigestion GU: No nocturia  Skin: No rash, lesion, ulceration  MSK:  No joint pain or swelling.  +chronic back pain Neuro: No dizziness or lightheadedness.  Psych: +depression, anxiety (stable); sleep disturbance (improves with CPAP). No SI/HI. Mood stable.     Physical Exam:  BP 108/72 (BP Location: Left Arm, Patient Position: Sitting, Cuff Size: Large)   Pulse 77   Temp 98.4 F (36.9 C) (Oral)   Ht 5\' 10"  (1.778 m)   Wt 285 lb 3.2 oz (129.4 kg)   SpO2 96%   BMI 40.92 kg/m   GEN: Pleasant, interactive, well-appearing; obese; in no acute distress. HEENT:  Normocephalic and atraumatic. PERRLA. Sclera white. Nasal turbinates pink, moist and patent bilaterally. No rhinorrhea present. Oropharynx pink and moist, without exudate or edema. No lesions, ulcerations, or postnasal drip. Mallampati III NECK:  Supple w/ fair ROM. No JVD present. Normal carotid impulses w/o bruits. Thyroid symmetrical with no goiter or nodules palpated. No lymphadenopathy.   CV: RRR, no m/r/g, no peripheral edema. Pulses intact, +2 bilaterally. No cyanosis, pallor or clubbing. PULMONARY:  Unlabored, regular breathing. Clear bilaterally A&P w/o wheezes/rales/rhonchi. No accessory muscle use.  GI: BS present and normoactive. Soft, non-tender to palpation. No organomegaly or masses detected.  MSK: No erythema, warmth or tenderness. Cap refil <2 sec all extrem.  No deformities or joint swelling noted.  Neuro: A/Ox3. No focal deficits noted.   Skin: Warm, no lesions or rashe Psych: Normal affect and behavior. Judgement and thought content appropriate.  Lab Results:  CBC    Component Value Date/Time   WBC 13.9 (H) 07/25/2023 1514   RBC 4.59 07/25/2023 1514   HGB 12.6 07/25/2023 1514   HGB 13.5 01/30/2022 1637   HCT 38.9 07/25/2023 1514   HCT 42.7 01/30/2022 1637   PLT 318 07/25/2023 1514   PLT 312 01/30/2022 1637   MCV 84.7 07/25/2023 1514   MCV 85 01/30/2022 1637   MCH 27.5 07/25/2023 1514   MCHC 32.4 07/25/2023 1514   RDW 11.8 07/25/2023 1514   RDW 11.8 01/30/2022 1637   LYMPHSABS 5,185 (H) 07/25/2023 1514   LYMPHSABS 4.2 (H) 01/30/2022 1637   MONOABS 1.5 (H) 04/19/2022 1015   EOSABS 445 07/25/2023 1514   EOSABS 0.3 01/30/2022 1637   BASOSABS 56 07/25/2023 1514   BASOSABS 0.0 01/30/2022 1637    BMET    Component Value Date/Time   NA 139 07/25/2023 1514   NA 143 04/04/2022 1426   K 3.9 07/25/2023 1514   CL 101 07/25/2023 1514   CO2 27 07/25/2023 1514   GLUCOSE 88 07/25/2023 1514   BUN 15 07/25/2023 1514   BUN 15 04/04/2022 1426   CREATININE 0.84 07/25/2023 1514   CALCIUM 9.4 07/25/2023 1514   GFRNONAA >60 04/19/2022 1015   GFRAA  10/12/2009 0600    >60        The eGFR has been calculated using the MDRD equation. This calculation has not been validated in all clinical situations. eGFR's persistently <60 mL/min signify possible Chronic Kidney Disease.    BNP No results found for: "BNP"   Imaging:  No results found.  Administration History     None           No data to display          No results found for: "NITRICOXIDE"      Assessment & Plan:   OSA (obstructive sleep apnea) Severe OSA on CPAP. Good compliance and control. Difficulties with pressure settings and some moderate leaks. She's had significant weight loss (50 lb) since her sleep study. Will decrease her pressure settings  to 8-12 cmH2O (95th 12.5). Advised her to monitor response. May need mask refit - aware to contact DME to resize/try alternative mask if leaks persist with pressure change. She does receive benefit from use. Encouraged continued healthy weight loss measures. Aware of risks of untreated OSA. Safe driving practices reviewed.   Patient Instructions  Continue to use CPAP every night, minimum of 4-6 hours a night.  Change equipment as directed. Wash your tubing with warm soap and water daily, hang to dry. Wash humidifier portion weekly. Use bottled, distilled water and change daily Be aware of reduced alertness and do not drive or operate heavy machinery if experiencing this or drowsiness.  Exercise encouraged, as tolerated. Healthy weight management discussed.  Avoid or decrease alcohol consumption and medications that make you more sleepy, if possible. Notify if persistent daytime sleepiness occurs even with consistent use of PAP therapy.  I am going to adjust your settings to 8-12 cmH2O. If you still have leaks with this, you may need your mask resized. Call [facility] to schedule an appointment with them  [facility] job on your weight loss journey!  We will continue to monitor your symptoms, weight loss and CPAP tolerance. At some point, we may repeat a sleep study to reassess severity but so far, CPAP seems to be benefiting you so recommend continuing   Follow up in 6 months with Katie Kenroy Timberman,NP. If symptoms  do not improve or worsen, please contact office for sooner follow up     Obesity, Class III, BMI 40-49.9 (morbid obesity) (HCC) BMI 40. Continued healthy weight loss encouraged.     I spent 35 minutes of dedicated to the care of this patient on the date of this encounter to include pre-visit review of records, face-to-face time with the patient discussing conditions above, post visit ordering of testing, clinical documentation with the electronic health record, making appropriate referrals as  documented, and communicating necessary findings to members of the patients care team.  Noemi Chapel, NP 02/06/2024  Pt aware and understands NP's role.

## 2024-02-13 ENCOUNTER — Other Ambulatory Visit: Payer: Self-pay

## 2024-02-13 NOTE — Progress Notes (Signed)
Specialty Pharmacy Refill Coordination Note  Robin [name] is a 53 y.o. female contacted today regarding refills of specialty medication(s) Abatacept (Orencia ClickJect)   Patient requested Delivery   Delivery date: 02/19/24   Verified address: [address]   Medication will be filled on 02.24.25.

## 2024-02-25 NOTE — Progress Notes (Unsigned)
 Cardiology Office Note:    Date:  02/26/2024  ID:  Robin [name], DOB 1971-10-20, MRN [id number] PCP: Quita Skye, PA-C  [facility] Providers Cardiologist:  Maisie Fus, MD       Patient Profile:      Chief Complaint: 37-month follow-up for hypertension and preoperative clearance  History of Present Illness:  Robin [name] is a 53 y.o. female with visit-pertinent history of HTN, RA on adalimumab, OSA, obesity, anxiety, depression  She establish care with cardiology service on 01/31/2023 for evaluation of hypertension.  She was noted to have sleep study in 2010 diagnosed with moderate obstructive sleep apnea she was started on CPAP therapy and been on for many years, however lost her insurance and stopped receiving supplies so she stopped wearing CPAP a few years ago.  She was referred to pulmonology for OSA.  Her carvedilol was increased to 25 mg twice daily.  Last seen in clinic on 08/03/2023.  Blood pressure under better control at 132/84.  She is continued on carvedilol 12.5 mg twice daily, losartan/HCTZ 100/25 mg daily, amlodipine 10 mg daily  Seen by pulmonology on 02/06/2024.  She was diagnosed with severe sleep apnea after split-night study.  She was back on CPAP and doing well.  She continued to lose weight and was down 50 pounds since her sleep study.  Discussed the use of AI scribe software for clinical note transcription with the patient, who gave verbal consent to proceed.  Today the patient comes into clinic by herself and presents for a preoperative evaluation for a right knee replacement as well as her 23-month follow-up. She reports that she needs both knees replaced, but will start with the right one.  Surgery date is TBD.  She has lost about 50 pounds since her gastric bypass surgery in October 2024. She attributes her weight loss to dietary changes and exercise, including water aerobics and using the elliptical, despite her knee pain.  She maintains a  heart healthy diet however, she admits to recently reverting to old habits of skipping meals. She denies any cardiovascular concerns or complaints, including chest pain, lightheadedness, shortness of breath, orthopnea, PND, LE edema, palpitations, or syncope. The only pain she reports is in her back and knees.    Review of systems:  Please see the history of present illness. All other systems are reviewed and otherwise negative.     Home Medications:    Current Meds  Medication Sig   Abatacept (ORENCIA CLICKJECT) 125 MG/ML SOAJ Inject 125 mg into the skin once a week.   albuterol (VENTOLIN HFA) 108 (90 Base) MCG/ACT inhaler Inhale 2 puffs into the lungs every 6 (six) hours as needed for wheezing or shortness of breath.   amLODipine (NORVASC) 10 MG tablet Take 1 tablet (10 mg total) by mouth daily.   atorvastatin (LIPITOR) 20 MG tablet Take 1 tablet (20 mg total) by mouth daily.   buPROPion (WELLBUTRIN SR) 200 MG 12 hr tablet Take one tablet every morning with breakfast.  Take half  a tablet every evening   carvedilol (COREG) 12.5 MG tablet Take 1 tablet (12.5 mg total) by mouth 2 (two) times daily with a meal.   cyclobenzaprine (FLEXERIL) 10 MG tablet Take 1 tablet (10 mg total) by mouth at bedtime.   diclofenac Sodium (VOLTAREN) 1 % GEL Apply topically 4 (four) times daily.   ESTROGENS CONJUGATED PO Take by mouth.   fluticasone (FLONASE) 50 MCG/ACT nasal spray Place into the nose.  GAVILAX 17 GM/SCOOP powder SMARTSIG:17 Gram(s) By Mouth Every Night PRN   hydrALAZINE (APRESOLINE) 25 MG tablet 2 tablets.   HYDROcodone-acetaminophen (NORCO) 10-325 MG tablet Take 1 tablet by mouth in the morning, at noon, and at bedtime.   Lactobacillus (ACIDOPHILUS) TABS    losartan-hydrochlorothiazide (HYZAAR) 100-25 MG tablet Take 1 tablet by mouth daily.   Melatonin 10 MG CAPS Take 20 mg by mouth at bedtime as needed (sleep).   metroNIDAZOLE (FLAGYL) 500 MG tablet Take 1 tablet (500 mg total) by mouth 2  (two) times daily.   omeprazole (PRILOSEC) 40 MG capsule Take 40 mg by mouth daily.   ondansetron (ZOFRAN) 8 MG tablet Take 8 mg by mouth every 8 (eight) hours as needed for nausea or vomiting.   pregabalin (LYRICA) 75 MG capsule Take 1 capsule (75 mg total) by mouth 2 (two) times daily.   triamcinolone lotion (KENALOG) 0.1 % Apply 1 Application topically 3 (three) times daily.   ursodiol (ACTIGALL) 250 MG tablet Take 250 mg by mouth 2 (two) times daily.   Studies Reviewed:   EKG Interpretation Date/Time:  Tuesday February 26 2024 08:55:02 EST Ventricular Rate:  78 PR Interval:  156 QRS Duration:  88 QT Interval:  388 QTC Calculation: 442 R Axis:   47  Text Interpretation: Normal sinus rhythm Possible Left atrial enlargement Low voltage QRS Nonspecific T wave abnormality Confirmed by Rise Paganini [phone]) on 02/26/2024 9:30:06 AM   Risk Assessment/Calculations:             Physical Exam:   VS:  BP 102/68 (BP Location: Left Arm, Patient Position: Sitting, Cuff Size: Normal)   Pulse 78   Ht 5\' 10"  (1.778 m)   Wt 270 lb (122.5 kg)   SpO2 94%   BMI 38.74 kg/m    Wt Readings from Last 3 Encounters:  02/26/24 270 lb (122.5 kg)  02/06/24 285 lb 3.2 oz (129.4 kg)  02/04/24 280 lb (127 kg)    GEN: Well nourished, well developed in no acute distress NECK: No JVD; No carotid bruits CARDIAC: RRR, no murmurs, rubs, gallops RESPIRATORY:  Clear to auscultation without rales, wheezing or rhonchi  ABDOMEN: Soft, non-tender, non-distended EXTREMITIES:  No edema; No acute deformity      Assessment and Plan:  Preoperative cardiac clearance According to the Revised Cardiac Risk Index (RCRI), her Perioperative Risk of Major Cardiac Event is (%): 0.4. Her Functional Capacity in METs is: 8.97 according to the Duke Activity Status Index (DASI). Therefore, based on ACC/AHA guidelines, patient would be at acceptable risk for the planned procedure without further cardiovascular testing. I will route  this recommendation to the requesting party via Epic fax function.   Hypertension Blood pressure today 102/60 and under excellent control under her goal of less than 130/80 Her blood pressure average at home is 120s / 80s.  She will continue to monitor blood pressure at home and notify office if blood pressures consistently over her goal -Continue current antihypertensive regimen of amlodipine 10 mg daily, carvedilol 12.5 mg twice daily, losartan-hydrochlorothiazide 100-25 mg daily  Severe sleep apnea Remains adherent to CPAP -Managed by pulmonology  Intermediate ASCVD risk Today she is without any anginal symptoms, no indication for further ischemic evaluation at this time EKG NSR without acute changes -Continue atorvastatin 20 mg daily  Obesity S/p gastric bypass in October 2024 She has lost 50 pounds since her surgery and engaging in water aerobics and elliptical exercises -Recommend DASH diet (high in vegetables, fruits, low-fat dairy  products, whole grains, poultry, fish, and nuts and low in sweets, sugar-sweetened beverages, and red meats), salt restriction and increase physical activity.             Dispo:  Return in about 1 year (around 02/25/2025).  Signed, Denyce Robert, NP

## 2024-02-26 ENCOUNTER — Encounter: Payer: Self-pay | Admitting: Emergency Medicine

## 2024-02-26 ENCOUNTER — Ambulatory Visit: Payer: Medicaid Other | Attending: Emergency Medicine | Admitting: Emergency Medicine

## 2024-02-26 VITALS — BP 102/68 | HR 78 | Ht 70.0 in | Wt 270.0 lb

## 2024-02-26 DIAGNOSIS — Z01810 Encounter for preprocedural cardiovascular examination: Secondary | ICD-10-CM | POA: Insufficient documentation

## 2024-02-26 DIAGNOSIS — Z6838 Body mass index (BMI) 38.0-38.9, adult: Secondary | ICD-10-CM | POA: Insufficient documentation

## 2024-02-26 DIAGNOSIS — E66812 Obesity, class 2: Secondary | ICD-10-CM | POA: Diagnosis present

## 2024-02-26 DIAGNOSIS — I10 Essential (primary) hypertension: Secondary | ICD-10-CM | POA: Diagnosis present

## 2024-02-26 DIAGNOSIS — G4733 Obstructive sleep apnea (adult) (pediatric): Secondary | ICD-10-CM | POA: Insufficient documentation

## 2024-02-26 NOTE — Patient Instructions (Signed)
 Medication Instructions:  NO CHANGES    Lab Work: NONE   Testing/Procedures: NONE   Follow-Up: At [facility], you and your health needs are our priority.  As part of our continuing mission to provide you with exceptional heart care, we have created designated Provider Care Teams.  These Care Teams include your primary Cardiologist (physician) and Advanced Practice Providers (APPs -  Physician Assistants and Nurse Practitioners) who all work together to provide you with the care you need, when you need it.    Your next appointment:   1 YEAR.  Provider:   Rise Paganini, NP  Other Instructions:

## 2024-03-12 ENCOUNTER — Other Ambulatory Visit: Payer: Self-pay

## 2024-03-12 NOTE — Progress Notes (Signed)
 Specialty Pharmacy Refill Coordination Note  Robin [name] is a 53 y.o. female contacted today regarding refills of specialty medication(s) Abatacept (Orencia ClickJect)   Patient requested (Patient-Rptd) Delivery   Delivery date:  (updated delivery date is 03.25.25 - patient notified via mychart)   Verified address: (Patient-Rptd) [address] [place], [place] [address]   Medication will be filled on 03.24.25.

## 2024-03-14 ENCOUNTER — Other Ambulatory Visit: Payer: Self-pay

## 2024-03-14 NOTE — Progress Notes (Signed)
 Patient called back and she will not be home for delivery on Tuesday 3/25. Requested we reschedule to Friday 3/28 instead.

## 2024-03-17 ENCOUNTER — Other Ambulatory Visit: Payer: Self-pay

## 2024-03-17 ENCOUNTER — Other Ambulatory Visit (HOSPITAL_COMMUNITY): Payer: Self-pay

## 2024-03-20 ENCOUNTER — Other Ambulatory Visit: Payer: Self-pay

## 2024-03-21 ENCOUNTER — Other Ambulatory Visit: Payer: Self-pay

## 2024-03-27 ENCOUNTER — Other Ambulatory Visit: Payer: Self-pay

## 2024-03-27 NOTE — Progress Notes (Signed)
 Patient was going to contact us back with updated insurance as of 3/24. LVM for her to follow up on 3/28. We have not received a response, profiling Rx for now.

## 2024-04-08 ENCOUNTER — Other Ambulatory Visit: Payer: Self-pay

## 2024-04-11 ENCOUNTER — Other Ambulatory Visit: Payer: Self-pay

## 2024-04-14 ENCOUNTER — Other Ambulatory Visit: Payer: Self-pay | Admitting: Pharmacy Technician

## 2024-04-14 ENCOUNTER — Encounter: Payer: Self-pay | Admitting: Internal Medicine

## 2024-04-14 ENCOUNTER — Other Ambulatory Visit: Payer: Self-pay

## 2024-04-14 NOTE — Progress Notes (Signed)
 Specialty Pharmacy Refill Coordination Note  Robin [name] is a 53 y.o. female contacted today regarding refills of specialty medication(s) Abatacept  (Orencia  ClickJect)   Patient requested: Patient cannot fill right now due to awaiting for PAP.  Delivery date:  (Not right now)   Verified address: No data recorded  Medication will be filled on : no filling right now   Spoke to Patient and is awaiting to hear back from MD regarding prescription assistant. Patient does not have a date for injection. Patient will callback if anything changes. Patient would like refill callback within 1 month.

## 2024-04-16 NOTE — Progress Notes (Signed)
 Office Visit Note  Patient: Robin [name]             Date of Birth: Sep 19, 1971           MRN: [id number]             PCP: Leanora Prophet, PA-C Referring: Leanora Prophet, PA-C Visit Date: 04/28/2024   Subjective:  Follow-up   Discussed the use of AI scribe software for clinical note transcription with the patient, who gave verbal consent to proceed.  History of Present Illness   Robin [name] is a 52 y.o. female here for follow up for seronegative RA and OA now on Orencia  125 mg subcu weekly and lyrica  75 mg BID.    She has experienced increased pain since discontinuing her medication in March due to a change in insurance from [place] to [employer]. The pain has slightly increased since being off her medication for over a month.  She was previously on Orencia  and Humira  for rheumatoid arthritis. She is considering switching to an infusion treatment for Orencia  due to cost concerns with her current Medicare plan. She has not had any medication since March and is experiencing more pain as a result.  She is currently taking Lyrica  75 mg, which she finds very helpful, allowing her to 'get around, go places, and do things.' Lyrica  works better than gabapentin , which caused a 'funny feeling' in her head. She is interested in increasing the Lyrica  dose as she is tolerating it well without significant side effects, except for some swelling in one leg, which she attributes to previous surgery on her toes.  She also experiences significant pain from carpal tunnel syndrome, which she manages with wrist braces worn at night and sometimes during the day. She has had a painful but effective injection in the past for this condition. Her knees are also problematic, with bone spurs causing pain, and she was scheduled for knee replacement surgery before her insurance changed. She finds relief from water exercises, which are low impact and beneficial for her condition.  She takes  cyclobenzaprine  at night as needed, which she finds helpful. She is working on making progress after surgery last year and is cautious about using steroids. No new leg swelling on Lyrica , except for one leg which she attributes to past surgery. Significant pain in her shoulders and hands, particularly from carpal tunnel syndrome. No other side effects from her current medications.       Previous HPI 02/04/2024 Robin [name] is a 53 y.o. female here for follow up for seronegative RA now on Orencia  125 mg subcu weekly and OA..     She experiences widespread pain, particularly in her back, shoulders, elbows, and hands, with radiation up her arms and legs. The pain is sharp, constant, and exacerbated by cold weather, causing her body to 'lock up' when stepping outside. She reports continuous and severe pain down to her bones and muscles.   She has bilateral carpal tunnel syndrome contributing to hand pain, with swelling described as 'like a mitten' and occasional knee swelling after walking. She experiences tingling and a 'falling asleep sensation' in her hands at night, despite wearing wrist braces. No specific treatments for carpal tunnel syndrome have been tried.   She has difficulty sleeping due to pain, despite using 'sleepy time tea,' and sleeps no more than four hours a night in short intervals. Various medications have been tried for pain, including gabapentin , which was discontinued due to side effects, and  Lyrica , stopped due to insurance changes. She is currently taking Wellbutrin  for depression and anxiety.   She has been attempting to exercise at the [facility] but finds the pain overwhelming. Prednisone  provides some relief, though not sufficient. She previously used hydrocodone  for back pain but stopped due to withdrawal symptoms.   She is scheduled to see her orthopedic specialist for a knee replacement consultation, as her weight has decreased sufficiently for the procedure.      Previous HPI 11/01/2023 Robin [name] is a 53 y.o. female here for follow up for seronegative RA now on Orencia  125 mg subcu weekly.  She is currently holding her medication due to going for laparoscopic gastrectomy on October 22.  She is asking today whether she can resume her medication.  She did notice within 1 to 2 weeks of skipping Orencia  starting to have increased joint pain especially in her right hand.  Also reports increased right shoulder pain that is most severe at night and with walking using a cane.  Surgery was uneventful she has been progressing with her diet.  One of the portals took longer than others to heal but has now scabbed over since 2 or 3 days ago.  Still has more symptoms from bilateral knee pain with weightbearing limits her to walking for only a few minutes at a time and uses cane for offloading.   07/30/2023 Robin [name] is a 53 y.o. female here for follow up for seronegative RA now on Orencia  125 mg subcu weekly.  She not had any major flareup with joint pain or swelling.  Is noticing some ongoing swelling worst around the base of the thumb on left and right hand occasionally involving the second finger as well.  She saw her [facility] provider for pain in both shoulders and across the upper back.  Had x-rays for this and had a left shoulder steroid injection with Dr. Brunilda Capra last week.  So far this is beneficial discussed plan to try on the right side for this in a week or 2 if it is continuing to help.  She is walking with cane for assistance for bilateral knee pain this sometimes causes her right elbow to hurt a little bit.   04/25/2023 Robin [name] is a 53 y.o. female here for follow up for seronegative RA now on Orencia  125 mg subcu weekly.  She started with first dose of medication at pharmacy clinic appointment on March 6.  Has not experienced any difficulty with the medication or injection site reactions.  Labs checked from April 11 looks fine no  problems with new medication start.  So far feels pretty good with this though she still has significant ongoing joint pain.  Worst complaint today in her shoulders and at the right elbow.   02/19/23 Robin [name] is a 53 y.o. female here for follow up for seronegative RA on Humira  40 mg subcu q. 14 days.  We just met last month discussed possibly switching to Actemra due to decreased Humira  efficacy.  However after reviewing side effect risks in more detail she is very concerned about this medication and was to look at other options.  Including possible side effects affecting her blood pressure or pancreatitis or liver function.   01/10/23 Robin [name] is a 53 y.o. female here for follow up for seronegative RA on Humira  40 mg [place] q14days. Since our last visit she has a lot of ongoing joint pain her worst affected joints are in both knees  where she has known severe osteoarthritis.  She is planned for completing series of visco supplementation injections. This is ongoing as a temporizing measure she is not currently candidate for joint replacement surgery due to morbid obesity. She is seeing weight loss clinic for this.  Besides that she still having some ongoing joint pain and swelling in her upper extremities and increase in facial rashes.   12/05/22 Robin [name] is a 53 y.o. female here for follow up for seronegative RA on Humira  40 mg [place] q14days. She had updated labs checked in family medicine clinic November with negative RA serology and normal inflammatory markers. Some improvement in swelling with Humira  but continues to have a lot of pain on a daily basis, worst in knees with known OA also in both hands, right elbow, left shoulder worse. She had injections to her knees with only a few days of benefit. On discussion of lab findings and notes reviewed, patient reports previously thought to have sarcoid with abnormal CXR and eye inflammation problems but was not on disease specific  treatment at that time. She also had abnormal labs concerning for lupus for years when younger does not recall the exact details.     Labs reviewed 10/26/22 ANA pos RF neg CCP neg ESR wnl CRP wnl Vit D 1,25 wnl   Previous HPI 04/18/22 Robin [name] is a 53 y.o. female here for seronegative RA, previously a patient with Dr. Bascom Lily. She was taking Humira  previously interrupted due to loss of coverage and leflunomide with questionable response in symptoms. She was diagnosed after visit in 11/2019 with multiple areas of joint pain and frequent swelling particularly in bilateral wrists.  Laboratory work-up at that time including rheumatoid factor, CCP, ACE, HLA-B27, sed rate, and CRP were all negative.  She was started on methotrexate and the leflunomide with lack of response in her symptoms.  She started on Humira  for this and took it for a few months but was then off the medication for at least about a year due to change in insurance status after losing her job.  She restarted this since 2 months ago so far has not seen a very impressive change in her symptoms.  She is significantly limited by severe lower back pain with degenerative arthritis pursuing disability for this.  Also has advanced osteoarthritis of bilateral knees.  Shoulder pain limits overhead movement or worsened with pressure.  Gets chronic hand pain worse around the thumbs but especially on the radial side of her wrist with fairly chronic swelling currently worse than normal on the right side.  She has had local steroid joint injections in multiple areas but none in the past year these were typically beneficial for at least weeks in the past.  For her osteoarthritis pain she takes diclofenac  twice daily with a pretty good benefit has not experienced any side effects or problems taking this medicine.    DMARD Hx Orencia  - current Humira  - loss of response MTX and LEF - inadequate response   Review of Systems  Constitutional:   Positive for fatigue.  HENT:  Positive for mouth sores and mouth dryness.   Eyes:  Positive for dryness.  Respiratory:  Positive for shortness of breath.   Cardiovascular:  Negative for chest pain and palpitations.  Gastrointestinal:  Positive for constipation. Negative for blood in stool and diarrhea.  Endocrine: Negative for increased urination.  Genitourinary:  Negative for involuntary urination.  Musculoskeletal:  Positive for joint pain, gait problem, joint pain,  joint swelling, myalgias, muscle weakness, morning stiffness, muscle tenderness and myalgias.  Skin:  Positive for hair loss and sensitivity to sunlight. Negative for color change and rash.  Allergic/Immunologic: Negative for susceptible to infections.  Neurological:  Positive for dizziness. Negative for headaches.  Hematological:  Positive for swollen glands.  Psychiatric/Behavioral:  Positive for depressed mood and sleep disturbance. The patient is nervous/anxious.     PMFS History:  Patient Active Problem List   Diagnosis Date Noted   Myofascial pain 02/04/2024   Recurrent major depressive disorder, in partial remission (HCC) 11/01/2023   Status post gastric surgery 10/31/2023   Bilateral shoulder pain 04/25/2023   Dysphagia 04/17/2023   Gastroesophageal reflux disease without esophagitis 04/17/2023   Colon cancer screening 04/17/2023   Constipation 04/17/2023   Bilateral carpal tunnel syndrome 03/12/2023   Positive ANA (antinuclear antibody) 12/05/2022   Vitamin D deficiency 11/27/2022   High risk medication use 04/19/2022   Financial insecurity 02/13/2022   Anxiety and depression 01/30/2022   Hypercholesterolemia 01/30/2022   Primary hypertension 01/30/2022   Rheumatoid arthritis of multiple sites with negative rheumatoid factor (HCC) 01/30/2022   Osteoarthritis of left knee 06/01/2021   Lumbar spinal stenosis status post L2-L3 TLIF 05/05/2021   Degenerative scoliosis 04/18/2021   Sarcoidosis 05/10/2020    Pain of left hip joint 03/24/2020   Cervical spondylosis without myelopathy 07/16/2019   Neck pain 07/16/2019   Obesity, Class III, BMI 40-49.9 (morbid obesity) 10/18/2018   Primary osteoarthritis of both knees 10/18/2018   Hammertoe of right foot 07/02/2018   OSA (obstructive sleep apnea) 10/26/2014    Past Medical History:  Diagnosis Date   Allergy    Anxiety    Asthma    Bilateral carpal tunnel syndrome    Degeneration of lumbar intervertebral disc    Depression    DM (diabetes mellitus) (HCC)    GERD (gastroesophageal reflux disease)    Hyperlipidemia    Hypertension    OSA (obstructive sleep apnea)    Osteoarthritis of both knees    Pneumonia    Rheumatoid arthritis (HCC)    Sarcoidosis    Spinal stenosis    Vitamin D deficiency     Family History  Problem Relation Age of Onset   Colon polyps Mother    Colon cancer Neg Hx    Stomach cancer Neg Hx    Esophageal cancer Neg Hx    Rectal cancer Neg Hx    Past Surgical History:  Procedure Laterality Date   ABDOMINAL HYSTERECTOMY     DEBRIDEMENT AND CLOSURE WOUND N/A 06/15/2021   Procedure: Irrigation and debridement and closure of spine wound;  Surgeon: Mort Ards, MD;  Location: [facility] OR;  Service: Orthopedics;  Laterality: N/A;   ESOPHAGOGASTRODUODENOSCOPY     FOOT SURGERY Bilateral    Both surgeries for bunions by Dr. Chalmers Columbus per pt   gastric sleeve surgery  10/16/2023   TRANSFORAMINAL LUMBAR INTERBODY FUSION (TLIF) WITH PEDICLE SCREW FIXATION 1 LEVEL N/A 05/05/2021   Procedure: TRANSFORAMINAL LUMBAR INTERBODY FUSION (TLIF) WITH PEDICLE SCREW FIXATION 1 LEVEL L2-3;  Surgeon: Mort Ards, MD;  Location: [facility] OR;  Service: Orthopedics;  Laterality: N/A;  4 hrs   Social History   Social History Narrative   Not on file   Immunization History  Administered Date(s) Administered   Influenza,inj,quad, With Preservative 09/25/2019   Influenza-Unspecified 10/20/2017   MODERNA COVID-19 SARS-COV-2 PEDS BIVALENT  BOOSTER 3yr-22yr 01/07/2020, 02/04/2020   Unspecified SARS-COV-2 Vaccination 01/07/2020, 02/04/2020     Objective: Vital  Signs: BP 115/72 (BP Location: Left Arm, Patient Position: Sitting, Cuff Size: Normal)   Pulse 66   Resp 14   Ht 5\' 10"  (1.778 m)   Wt 281 lb (127.5 kg)   BMI 40.32 kg/m    Physical Exam Constitutional:      Appearance: She is obese.  Eyes:     Conjunctiva/sclera: Conjunctivae normal.  Cardiovascular:     Rate and Rhythm: Normal rate and regular rhythm.  Pulmonary:     Effort: Pulmonary effort is normal.     Breath sounds: Normal breath sounds.  Musculoskeletal:     Right lower leg: No edema.     Left lower leg: No edema.  Skin:    General: Skin is warm and dry.     Findings: No rash.  Neurological:     Mental Status: She is alert.  Psychiatric:        Mood and Affect: Mood normal.      Musculoskeletal Exam:  Shoulders full ROM no tenderness or swelling Elbows full ROM no tenderness or swelling Wrists full ROM, pain with flexion/extension but no radiating pain from percussion over carpal tunnel, right wrist ganglion cyst over FCR Fingers full ROM no tenderness or swelling Knees full ROM, bilateral patellofemoral crepitus, mild anterior joint line tenderness to pressure worse on right side  Investigation: No additional findings.  Imaging: No results found.  Recent Labs: Lab Results  Component Value Date   WBC 13.9 (H) 07/25/2023   HGB 12.6 07/25/2023   PLT 318 07/25/2023   NA 139 07/25/2023   K 3.9 07/25/2023   CL 101 07/25/2023   CO2 27 07/25/2023   GLUCOSE 88 07/25/2023   BUN 15 07/25/2023   CREATININE 0.84 07/25/2023   BILITOT 0.4 07/25/2023   ALKPHOS 96 04/19/2022   AST 20 07/25/2023   ALT 22 07/25/2023   PROT 6.8 07/25/2023   ALBUMIN  3.5 04/19/2022   CALCIUM  9.4 07/25/2023   GFRAA  10/12/2009    >60        The eGFR has been calculated using the MDRD equation. This calculation has not been validated in all  clinical situations. eGFR's persistently <60 mL/min signify possible Chronic Kidney Disease.   QFTBGOLDPLUS NEGATIVE 04/05/2023    Speciality Comments: No specialty comments available.  Procedures:  No procedures performed Allergies: Lisinopril   Assessment / Plan:     Visit Diagnoses: Rheumatoid arthritis of multiple sites with negative rheumatoid factor (HCC) - Plan: Sedimentation rate Increased pain after stopping Orencia . Reports OOP cost ~500 per dose on new plan. Is open to changing administration route or medication as needed. - Recheck sed rate for disease activity monitoring, still no peripheral synovitis apparent on exam today - Consider alternative medications if Orencia  infusion is cost prohibitive.  High risk medication use - Orencia  at 125 mg subcu weekly - Plan: CBC with Differential/Platelet, Comprehensive metabolic panel with GFR, QuantiFERON-TB Gold Plus - Checking CBC, CMP, TB for medication monitoring on Orencia  or also if needed bDMARD switch  Myofascial pain - Lyrica  75mg  twice daily, Flexeril  10 mg daily at bedtime. - Plan: cyclobenzaprine  (FLEXERIL ) 10 MG tablet, pregabalin  (LYRICA ) 75 MG capsule Chronic pain Managed with Lyrica , effective without significant leg swelling. Prefers increased dose for better management. - Continue pregabalin , titration to 75 mg AM then 150 mg BID as tolerated - Monitor for side effects, especially leg swelling. - Flexeril  10 mg at night as needed  Knee osteoarthritis Chronic pain with bone spurs. Surgery delayed due to  insurance change. Water-based exercises provide relief.  Carpal tunnel syndrome Significant pain managed with wrist braces. Previous corticosteroid injection effective but painful. No urgent intervention needed. - Continue wrist braces and stretching exercises. - Discuss future corticosteroid injection if symptoms worsen.     Orders: Orders Placed This Encounter  Procedures   Sedimentation rate   CBC  with Differential/Platelet   Comprehensive metabolic panel with GFR   QuantiFERON-TB Gold Plus   Meds ordered this encounter  Medications   cyclobenzaprine  (FLEXERIL ) 10 MG tablet    Sig: Take 1 tablet (10 mg total) by mouth at bedtime as needed for muscle spasms.    Dispense:  30 tablet    Refill:  2   pregabalin  (LYRICA ) 75 MG capsule    Sig: Take 2 capsules (150 mg total) by mouth 2 (two) times daily.    Dispense:  120 capsule    Refill:  2     Follow-Up Instructions: Return in about 3 months (around 07/29/2024) for RA/OA on ABA?/LYR f/u 3mos.   Matt Song, MD  Note - This record has been created using [employer].  Chart creation errors have been sought, but may not always  have been located. Such creation errors do not reflect on  the standard of medical care.

## 2024-04-28 ENCOUNTER — Ambulatory Visit: Payer: Medicaid Other | Attending: Orthopedic Surgery | Admitting: Internal Medicine

## 2024-04-28 ENCOUNTER — Encounter: Payer: Self-pay | Admitting: Internal Medicine

## 2024-04-28 VITALS — BP 115/72 | HR 66 | Resp 14 | Ht 70.0 in | Wt 281.0 lb

## 2024-04-28 DIAGNOSIS — M7918 Myalgia, other site: Secondary | ICD-10-CM | POA: Diagnosis present

## 2024-04-28 DIAGNOSIS — M25512 Pain in left shoulder: Secondary | ICD-10-CM

## 2024-04-28 DIAGNOSIS — M0609 Rheumatoid arthritis without rheumatoid factor, multiple sites: Secondary | ICD-10-CM

## 2024-04-28 DIAGNOSIS — M25511 Pain in right shoulder: Secondary | ICD-10-CM

## 2024-04-28 DIAGNOSIS — M170 Bilateral primary osteoarthritis of knee: Secondary | ICD-10-CM

## 2024-04-28 DIAGNOSIS — Z79899 Other long term (current) drug therapy: Secondary | ICD-10-CM | POA: Diagnosis not present

## 2024-04-28 DIAGNOSIS — G8929 Other chronic pain: Secondary | ICD-10-CM

## 2024-04-28 DIAGNOSIS — G5603 Carpal tunnel syndrome, bilateral upper limbs: Secondary | ICD-10-CM

## 2024-04-28 MED ORDER — CYCLOBENZAPRINE HCL 10 MG PO TABS
10.0000 mg | ORAL_TABLET | Freq: Every evening | ORAL | 2 refills | Status: DC | PRN
Start: 2024-04-28 — End: 2024-06-12

## 2024-04-28 MED ORDER — PREGABALIN 75 MG PO CAPS
150.0000 mg | ORAL_CAPSULE | Freq: Two times a day (BID) | ORAL | 2 refills | Status: DC
Start: 2024-04-28 — End: 2024-07-29

## 2024-04-29 ENCOUNTER — Telehealth: Payer: Self-pay

## 2024-04-29 ENCOUNTER — Other Ambulatory Visit (HOSPITAL_COMMUNITY): Payer: Self-pay

## 2024-04-29 DIAGNOSIS — M0609 Rheumatoid arthritis without rheumatoid factor, multiple sites: Secondary | ICD-10-CM

## 2024-04-29 DIAGNOSIS — Z79899 Other long term (current) drug therapy: Secondary | ICD-10-CM

## 2024-04-29 NOTE — Telephone Encounter (Addendum)
 Patient changed to [employer]. Looks like rx was cashed out by pharmacy not run through [employer].  Submitted a Prior Authorization request to [employer] for ORENCIA  SQ via CoverMyMeds. Will update once we receive a response.  Key: [id number]   ----- Message from Matt Song sent at 04/28/2024 10:22 PM EDT ----- Ms. Emmons reports no longer being able to afford Orencia  after insurance change. Would changing to infusions be a better option for her or is there a lower tier biologic that we would need to switch to? Thanks.

## 2024-04-30 ENCOUNTER — Other Ambulatory Visit (HOSPITAL_COMMUNITY): Payer: Self-pay

## 2024-04-30 NOTE — Telephone Encounter (Signed)
 Received notification from [facility] regarding a prior authorization for ORENCIA  SQ. Authorization has been APPROVED from 04/29/24 to 10/30/24. Approval letter sent to scan center.  Per test claim, copay for 28 days supply is $1966.25  Authorization # [id number]  MyChart message sent to patient regarding applying for [employer] patient assistance to see if she qualifies  Geraldene Kleine, PharmD, MPH, BCPS, CPP Clinical Pharmacist (Rheumatology and Pulmonology)

## 2024-05-02 ENCOUNTER — Other Ambulatory Visit: Payer: Self-pay

## 2024-05-02 LAB — COMPREHENSIVE METABOLIC PANEL WITH GFR
AG Ratio: 1.4 (calc) (ref 1.0–2.5)
ALT: 19 U/L (ref 6–29)
AST: 20 U/L (ref 10–35)
Albumin: 3.9 g/dL (ref 3.6–5.1)
Alkaline phosphatase (APISO): 93 U/L (ref 37–153)
BUN: 18 mg/dL (ref 7–25)
CO2: 28 mmol/L (ref 20–32)
Calcium: 9.3 mg/dL (ref 8.6–10.4)
Chloride: 104 mmol/L (ref 98–110)
Creat: 0.73 mg/dL (ref 0.50–1.03)
Globulin: 2.7 g/dL (ref 1.9–3.7)
Glucose, Bld: 90 mg/dL (ref 65–99)
Potassium: 3.9 mmol/L (ref 3.5–5.3)
Sodium: 140 mmol/L (ref 135–146)
Total Bilirubin: 0.4 mg/dL (ref 0.2–1.2)
Total Protein: 6.6 g/dL (ref 6.1–8.1)
eGFR: 98 mL/min/{1.73_m2} (ref >=60)

## 2024-05-02 LAB — QUANTIFERON-TB GOLD PLUS
Mitogen-NIL: >10 [IU]/mL
NIL: 0.04 [IU]/mL
QuantiFERON-TB Gold Plus: NEGATIVE
TB1-NIL: 0.03 [IU]/mL
TB2-NIL: 0.01 [IU]/mL

## 2024-05-02 LAB — CBC WITH DIFFERENTIAL/PLATELET
Absolute Lymphocytes: 5182 {cells}/uL — ABNORMAL HIGH (ref 850–3900)
Absolute Monocytes: 836 {cells}/uL (ref 200–950)
Basophils Absolute: 51 {cells}/uL (ref 0–200)
Basophils Relative: 0.5 %
Eosinophils Absolute: 173 {cells}/uL (ref 15–500)
Eosinophils Relative: 1.7 %
HCT: 41.1 % (ref 35.0–45.0)
Hemoglobin: 13.2 g/dL (ref 11.7–15.5)
MCH: 27.2 pg (ref 27.0–33.0)
MCHC: 32.1 g/dL (ref 32.0–36.0)
MCV: 84.7 fL (ref 80.0–100.0)
MPV: 10.7 fL (ref 7.5–12.5)
Monocytes Relative: 8.2 %
Neutro Abs: 3958 {cells}/uL (ref 1500–7800)
Neutrophils Relative %: 38.8 %
Platelets: 296 10*3/uL (ref 140–400)
RBC: 4.85 10*6/uL (ref 3.80–5.10)
RDW: 11.8 % (ref 11.0–15.0)
Total Lymphocyte: 50.8 %
WBC: 10.2 10*3/uL (ref 3.8–10.8)

## 2024-05-02 LAB — SEDIMENTATION RATE: Sed Rate: 6 mm/h (ref 0–30)

## 2024-05-02 MED ORDER — ORENCIA CLICKJECT 125 MG/ML ~~LOC~~ SOAJ
125.0000 mg | SUBCUTANEOUS | 0 refills | Status: DC
  Filled 2024-05-02: qty 4, 28d supply, fill #0
  Filled 2024-05-28: qty 4, 28d supply, fill #1

## 2024-05-02 NOTE — Progress Notes (Signed)
 Specialty Pharmacy Refill Coordination Note  Robin [name] is a 53 y.o. female contacted today regarding refills of specialty medication(s) Abatacept  (Orencia  ClickJect)   Patient requested Delivery   Delivery date: 05/07/24   Verified address: [address] [place], [place] [address]   Medication will be filled on 05/06/24.

## 2024-05-02 NOTE — Telephone Encounter (Signed)
 Patient enrolled into RA grant through [employer]: ID: [id number] Eligibility start date: 02/01/2024 Group ID: [id number] Eligibility end date: 04/30/2025 RxBin ID: [id number] Assistance amount: $2500 PCN: [id number]

## 2024-05-26 NOTE — Progress Notes (Signed)
 Sent message, via epic in basket, requesting orders in epic from Careers adviser.

## 2024-05-27 NOTE — Progress Notes (Addendum)
 Anesthesia Review:  PCP: Leanora Prophet [facility] Clearance dated 02/19/24 in Media Tab  Cardiologist : [facility]  [place],NP : LOV  02/26/24  Clearance in Media Tab  clearance dated 02/26/24   PPM/ ICD: Device Orders: Rep Notified:  Chest x-ray : EKG : 02/26/24  Echo : Stress test: Cardiac Cath :   Activity level: can do a flight of stairs without difficutyl  Sleep Study/ CPAP : has cpap  Fasting Blood Sugar :      / Checks Blood Sugar -- times a day:    Blood Thinner/ Instructions /Last Dose: ASA / Instructions/ Last Dose :

## 2024-05-28 ENCOUNTER — Other Ambulatory Visit: Payer: Self-pay

## 2024-05-28 NOTE — Progress Notes (Signed)
 Specialty Pharmacy Refill Coordination Note  Robin [name] is a 53 y.o. female contacted today regarding refills of specialty medication(s) Abatacept  (Orencia  ClickJect)   Patient requested (Patient-Rptd) Delivery   Delivery date: (Patient-Rptd) 07/04/24   [place] [address]pt[address] [address]   Medication will be filled on 07/03/24.

## 2024-05-29 NOTE — Progress Notes (Signed)
 Sent message, via epic in basket, requesting orders in epic from Careers adviser.

## 2024-05-30 ENCOUNTER — Ambulatory Visit: Payer: Self-pay | Admitting: Student

## 2024-05-30 NOTE — Patient Instructions (Signed)
 SURGICAL WAITING ROOM VISITATION  Patients having surgery or a procedure may have no more than 2 support people in the waiting area - these visitors may rotate.    Children under the age of 49 must have an adult with them who is not the patient.  Visitors with respiratory illnesses are discouraged from visiting and should remain at home.  If the patient needs to stay at the hospital during part of their recovery, the visitor guidelines for inpatient rooms apply. Pre-op nurse will coordinate an appropriate time for 1 support person to accompany patient in pre-op.  This support person may not rotate.    Please refer to the [facility] website for the visitor guidelines for Inpatients (after your surgery is over and you are in a regular room).       Your procedure is scheduled on: 06/11/2024    Report to [facility] Main Entrance    Report to admitting at   0900AM   Call this number if you have problems the morning of surgery [phone]   Do not eat food :After Midnight.   After Midnight you may have the following liquids until _ 0815_____ AM DAY OF SURGERY  Water Non-Citrus Juices (without pulp, NO RED-Apple, White grape, White cranberry) Black Coffee (NO MILK/CREAM OR CREAMERS, sugar ok)  Clear Tea (NO MILK/CREAM OR CREAMERS, sugar ok) regular and decaf                             Plain Jell-O (NO RED)                                           Fruit ices (not with fruit pulp, NO RED)                                     Popsicles (NO RED)                                                               Sports drinks like Gatorade (NO RED)                      The day of surgery:  Drink ONE (1) Pre-Surgery Clear Ensure or G2 [id number] AM ( haVe completed by )  the morning of surgery. Drink in one sitting. Do not sip.  This drink was given to you during your hospital  pre-op appointment visit. Nothing else to drink after completing the  Pre-Surgery Clear Ensure or G2.           If you have questions, please contact your surgeon's office.        Oral Hygiene is also important to reduce your risk of infection.                                    Remember - BRUSH YOUR TEETH THE MORNING OF SURGERY WITH YOUR REGULAR TOOTHPASTE  DENTURES WILL BE REMOVED PRIOR TO SURGERY PLEASE  DO NOT APPLY "Poly grip" OR ADHESIVES!!!   Do NOT smoke after Midnight   Stop all vitamins and herbal supplements 7 days before surgery.   Take these medicines the morning of surgery with A SIP OF WATER:  inhalers as usual and bring, amlodipine , lexapro , wellbutrin , coreg , zyrtec if needed, flonase if needed, omeprazole, hydralazine , lyrice   DO NOT TAKE ANY ORAL DIABETIC MEDICATIONS DAY OF YOUR SURGERY  Bring CPAP mask and tubing day of surgery.                              You may not have any metal on your body including hair pins, jewelry, and body piercing             Do not wear make-up, lotions, powders, perfumes/cologne, or deodorant  Do not wear nail polish including gel and S&S, artificial/acrylic nails, or any other type of covering on natural nails including finger and toenails. If you have artificial nails, gel coating, etc. that needs to be removed by a nail salon please have this removed prior to surgery or surgery may need to be canceled/ delayed if the surgeon/ anesthesia feels like they are unable to be safely monitored.   Do not shave  48 hours prior to surgery.               Men may shave face and neck.   Do not bring valuables to the hospital. [place] IS NOT             RESPONSIBLE   FOR VALUABLES.   Contacts, glasses, dentures or bridgework may not be worn into surgery.   Bring small overnight bag day of surgery.   DO NOT BRING YOUR HOME MEDICATIONS TO THE HOSPITAL. PHARMACY WILL DISPENSE MEDICATIONS LISTED ON YOUR MEDICATION LIST TO YOU DURING YOUR ADMISSION IN THE HOSPITAL!    Patients discharged on the day of surgery will not be allowed to drive  home.  Someone NEEDS to stay with you for the first 24 hours after anesthesia.   Special Instructions: Bring a copy of your healthcare power of attorney and living will documents the day of surgery if you haven't scanned them before.              Please read over the following fact sheets you were given: IF YOU HAVE QUESTIONS ABOUT YOUR PRE-OP INSTRUCTIONS PLEASE CALL [phone]   If you received a COVID test during your pre-op visit  it is requested that you wear a mask when out in public, stay away from anyone that may not be feeling well and notify your surgeon if you develop symptoms. If you test positive for Covid or have been in contact with anyone that has tested positive in the last 10 days please notify you surgeon.      Pre-operative 5 CHG Bath Instructions   You can play a key role in reducing the risk of infection after surgery. Your skin needs to be as free of germs as possible. You can reduce the number of germs on your skin by washing with CHG (chlorhexidine  gluconate) soap before surgery. CHG is an antiseptic soap that kills germs and continues to kill germs even after washing.   DO NOT use if you have an allergy to chlorhexidine /CHG or antibacterial soaps. If your skin becomes reddened or irritated, stop using the CHG and notify one of our RNs at [phone].   Please shower  with the CHG soap starting 4 days before surgery using the following schedule:     Please keep in mind the following:  DO NOT shave, including legs and underarms, starting the day of your first shower.   You may shave your face at any point before/day of surgery.  Place clean sheets on your bed the day you start using CHG soap. Use a clean washcloth (not used since being washed) for each shower. DO NOT sleep with pets once you start using the CHG.   CHG Shower Instructions:  If you choose to wash your hair and private area, wash first with your normal shampoo/soap.  After you use shampoo/soap,  rinse your hair and body thoroughly to remove shampoo/soap residue.  Turn the water OFF and apply about 3 tablespoons (45 ml) of CHG soap to a CLEAN washcloth.  Apply CHG soap ONLY FROM YOUR NECK DOWN TO YOUR TOES (washing for 3-5 minutes)  DO NOT use CHG soap on face, private areas, open wounds, or sores.  Pay special attention to the area where your surgery is being performed.  If you are having back surgery, having someone wash your back for you may be helpful. Wait 2 minutes after CHG soap is applied, then you may rinse off the CHG soap.  Pat dry with a clean towel  Put on clean clothes/pajamas   If you choose to wear lotion, please use ONLY the CHG-compatible lotions on the back of this paper.     Additional instructions for the day of surgery: DO NOT APPLY any lotions, deodorants, cologne, or perfumes.   Put on clean/comfortable clothes.  Brush your teeth.  Ask your nurse before applying any prescription medications to the skin.      CHG Compatible Lotions   Aveeno Moisturizing lotion  Cetaphil Moisturizing Cream  Cetaphil Moisturizing Lotion  Clairol Herbal Essence Moisturizing Lotion, Dry Skin  Clairol Herbal Essence Moisturizing Lotion, Extra Dry Skin  Clairol Herbal Essence Moisturizing Lotion, Normal Skin  Curel Age Defying Therapeutic Moisturizing Lotion with Alpha Hydroxy  Curel Extreme Care Body Lotion  Curel Soothing Hands Moisturizing Hand Lotion  Curel Therapeutic Moisturizing Cream, Fragrance-Free  Curel Therapeutic Moisturizing Lotion, Fragrance-Free  Curel Therapeutic Moisturizing Lotion, Original Formula  Eucerin Daily Replenishing Lotion  Eucerin Dry Skin Therapy Plus Alpha Hydroxy Crme  Eucerin Dry Skin Therapy Plus Alpha Hydroxy Lotion  Eucerin Original Crme  Eucerin Original Lotion  Eucerin Plus Crme Eucerin Plus Lotion  Eucerin TriLipid Replenishing Lotion  Keri Anti-Bacterial Hand Lotion  Keri Deep Conditioning Original Lotion Dry Skin Formula  Softly Scented  Keri Deep Conditioning Original Lotion, Fragrance Free Sensitive Skin Formula  Keri Lotion Fast Absorbing Fragrance Free Sensitive Skin Formula  Keri Lotion Fast Absorbing Softly Scented Dry Skin Formula  Keri Original Lotion  Keri Skin Renewal Lotion Keri Silky Smooth Lotion  Keri Silky Smooth Sensitive Skin Lotion  Nivea Body Creamy Conditioning Oil  Nivea Body Extra Enriched Teacher, adult education Moisturizing Lotion Nivea Crme  Nivea Skin Firming Lotion  NutraDerm 30 Skin Lotion  NutraDerm Skin Lotion  NutraDerm Therapeutic Skin Cream  NutraDerm Therapeutic Skin Lotion  ProShield Protective Hand Cream  Provon moisturizing lotion

## 2024-06-02 ENCOUNTER — Other Ambulatory Visit: Payer: Self-pay

## 2024-06-02 ENCOUNTER — Encounter (HOSPITAL_COMMUNITY)
Admission: RE | Admit: 2024-06-02 | Discharge: 2024-06-02 | Disposition: A | Discharge status: Home / Self Care | Source: Ambulatory Visit | Attending: Orthopedic Surgery

## 2024-06-02 ENCOUNTER — Encounter (HOSPITAL_COMMUNITY): Payer: Self-pay

## 2024-06-02 VITALS — BP 130/82 | HR 66 | Temp 98.7°F | Resp 16 | Ht 70.0 in | Wt 281.0 lb

## 2024-06-02 DIAGNOSIS — Z01818 Encounter for other preprocedural examination: Secondary | ICD-10-CM | POA: Insufficient documentation

## 2024-06-02 LAB — BASIC METABOLIC PANEL WITH GFR
Anion gap: 9 (ref 5–15)
BUN: 24 mg/dL — ABNORMAL HIGH (ref 6–20)
CO2: 26 mmol/L (ref 22–32)
Calcium: 9.4 mg/dL (ref 8.9–10.3)
Chloride: 103 mmol/L (ref 98–111)
Creatinine, Ser: 0.76 mg/dL (ref 0.44–1.00)
GFR, Estimated: >60 mL/min (ref >60)
Glucose, Bld: 96 mg/dL (ref 70–99)
Potassium: 3.9 mmol/L (ref 3.5–5.1)
Sodium: 138 mmol/L (ref 135–145)

## 2024-06-02 LAB — CBC
HCT: 43.3 % (ref 36.0–46.0)
Hemoglobin: 13.6 g/dL (ref 12.0–15.0)
MCH: 28 pg (ref 26.0–34.0)
MCHC: 31.4 g/dL (ref 30.0–36.0)
MCV: 89.1 fL (ref 80.0–100.0)
Platelets: 294 10*3/uL (ref 150–400)
RBC: 4.86 MIL/uL (ref 3.87–5.11)
RDW: 13 % (ref 11.5–15.5)
WBC: 11.4 10*3/uL — ABNORMAL HIGH (ref 4.0–10.5)
nRBC: 0 % (ref 0.0–0.2)

## 2024-06-02 LAB — SURGICAL PCR SCREEN
MRSA, PCR: NEGATIVE
Staphylococcus aureus: NEGATIVE

## 2024-06-03 ENCOUNTER — Ambulatory Visit: Payer: Self-pay | Admitting: Student

## 2024-06-03 NOTE — H&P (Signed)
 TOTAL KNEE ADMISSION H&P  Patient is being admitted for right total knee arthroplasty.  Subjective:  Chief Complaint:right knee pain.  HPI: Robin [name], 53 y.o. female, has a history of pain and functional disability in the right knee due to arthritis and has failed non-surgical conservative treatments for greater than 12 weeks to includeNSAID's and/or analgesics, corticosteriod injections, flexibility and strengthening excercises, use of assistive devices, weight reduction as appropriate, and activity modification.  Onset of symptoms was gradual, starting 10 years ago with rapidlly worsening course since that time. The patient noted no past surgery on the right knee(s).  Patient currently rates pain in the right knee(s) at 10 out of 10 with activity. Patient has night pain, worsening of pain with activity and weight bearing, pain that interferes with activities of daily living, pain with passive range of motion, crepitus, and joint swelling.  Patient has evidence of subchondral cysts, subchondral sclerosis, periarticular osteophytes, and joint space narrowing by imaging studies. There is no active infection.  Patient Active Problem List   Diagnosis Date Noted   Myofascial pain 02/04/2024   Recurrent major depressive disorder, in partial remission (HCC) 11/01/2023   Status post gastric surgery 10/31/2023   Bilateral shoulder pain 04/25/2023   Dysphagia 04/17/2023   Gastroesophageal reflux disease without esophagitis 04/17/2023   Colon cancer screening 04/17/2023   Constipation 04/17/2023   Bilateral carpal tunnel syndrome 03/12/2023   Positive ANA (antinuclear antibody) 12/05/2022   Vitamin D deficiency 11/27/2022   High risk medication use 04/19/2022   Financial insecurity 02/13/2022   Anxiety and depression 01/30/2022   Hypercholesterolemia 01/30/2022   Primary hypertension 01/30/2022   Rheumatoid arthritis of multiple sites with negative rheumatoid factor (HCC) 01/30/2022    Osteoarthritis of left knee 06/01/2021   Lumbar spinal stenosis status post L2-L3 TLIF 05/05/2021   Degenerative scoliosis 04/18/2021   Sarcoidosis 05/10/2020   Pain of left hip joint 03/24/2020   Cervical spondylosis without myelopathy 07/16/2019   Neck pain 07/16/2019   Obesity, Class III, BMI 40-49.9 (morbid obesity) 10/18/2018   Primary osteoarthritis of both knees 10/18/2018   Hammertoe of right foot 07/02/2018   OSA (obstructive sleep apnea) 10/26/2014   Past Medical History:  Diagnosis Date   Allergy    Anxiety    Asthma    Bilateral carpal tunnel syndrome    Degeneration of lumbar intervertebral disc    Depression    GERD (gastroesophageal reflux disease)    Hyperlipidemia    Hypertension    OSA (obstructive sleep apnea)    has cpap   Osteoarthritis of both knees    Pneumonia    Rheumatoid arthritis (HCC)    Sarcoidosis    Spinal stenosis    Vitamin D deficiency     Past Surgical History:  Procedure Laterality Date   ABDOMINAL HYSTERECTOMY     DEBRIDEMENT AND CLOSURE WOUND N/A 06/15/2021   Procedure: Irrigation and debridement and closure of spine wound;  Surgeon: Mort Ards, MD;  Location: [facility];  Service: Orthopedics;  Laterality: N/A;   ESOPHAGOGASTRODUODENOSCOPY     FOOT SURGERY Bilateral    Both surgeries for bunions by Dr. Chalmers [place] per pt   gastric sleeve surgery  10/16/2023   TRANSFORAMINAL LUMBAR INTERBODY FUSION (TLIF) WITH PEDICLE SCREW FIXATION 1 LEVEL N/A 05/05/2021   Procedure: TRANSFORAMINAL LUMBAR INTERBODY FUSION (TLIF) WITH PEDICLE SCREW FIXATION 1 LEVEL L2-3;  Surgeon: Mort Ards, MD;  Location: [facility] OR;  Service: Orthopedics;  Laterality: N/A;  4 hrs    Current  Outpatient Medications  Medication Sig Dispense Refill Last Dose/Taking   Abatacept  (ORENCIA  CLICKJECT) 125 MG/ML SOAJ Inject 125 mg into the skin once a week. 12 mL 0    acetaminophen  (TYLENOL ) 650 MG CR tablet Take 1,300 mg by mouth every 8 (eight) hours as needed (pain).       albuterol  (VENTOLIN  HFA) 108 (90 Base) MCG/ACT inhaler Inhale 2 puffs into the lungs every 6 (six) hours as needed for wheezing or shortness of breath. 18 g 2    amLODipine  (NORVASC ) 10 MG tablet Take 1 tablet (10 mg total) by mouth daily. 30 tablet 11    atorvastatin  (LIPITOR) 20 MG tablet Take 1 tablet (20 mg total) by mouth daily. 30 tablet 11    BIOTIN PO Take 1 tablet by mouth daily.      buPROPion  (WELLBUTRIN  SR) 200 MG 12 hr tablet Take 100-200 mg by mouth See admin instructions. Take 200 mg by mouth in the morning and take 100 mg in the evening      CALCIUM  PO Take 1 tablet by mouth daily.      carvedilol  (COREG ) 12.5 MG tablet Take 1 tablet (12.5 mg total) by mouth 2 (two) times daily with a meal. 60 tablet 5    cetirizine (ZYRTEC) 10 MG tablet Take 10 mg by mouth daily as needed for allergies.      cyclobenzaprine  (FLEXERIL ) 10 MG tablet Take 1 tablet (10 mg total) by mouth at bedtime as needed for muscle spasms. (Patient not taking: Reported on 05/29/2024) 30 tablet 2    diclofenac  Sodium (VOLTAREN ) 1 % GEL Apply 2 g topically daily as needed (pain).      escitalopram  (LEXAPRO ) 20 MG tablet Take 20 mg by mouth daily.      fluticasone (FLONASE) 50 MCG/ACT nasal spray Place 1 spray into both nostrils daily as needed for allergies.      GAVILAX 17 GM/SCOOP powder Take 119 g by mouth daily as needed for moderate constipation.      hydrALAZINE  (APRESOLINE ) 25 MG tablet Take 25 mg by mouth daily.      HYDROcodone -acetaminophen  (NORCO) 10-325 MG tablet Take 1 tablet by mouth in the morning, at noon, and at bedtime.      losartan -hydrochlorothiazide  (HYZAAR) 100-25 MG tablet Take 1 tablet by mouth daily. 90 tablet 3    Melatonin 10 MG CAPS Take 20 mg by mouth at bedtime as needed (sleep).      metroNIDAZOLE  (FLAGYL ) 500 MG tablet Take 1 tablet (500 mg total) by mouth 2 (two) times daily. (Patient not taking: Reported on 04/28/2024) 14 tablet 0    Multiple Vitamin (MULTIVITAMIN ADULT PO) Take 1  tablet by mouth daily.      omeprazole (PRILOSEC) 40 MG capsule Take 40 mg by mouth daily.      OVER THE COUNTER MEDICATION Take 1 tablet by mouth daily. Menopause Supplement      pregabalin  (LYRICA ) 75 MG capsule Take 2 capsules (150 mg total) by mouth 2 (two) times daily. 120 capsule 2    triamcinolone  lotion (KENALOG ) 0.1 % Apply 1 Application topically 3 (three) times daily.      No current facility-administered medications for this visit.   Allergies  Allergen Reactions   Lisinopril Cough    Social History   Tobacco Use   Smoking status: Former    Current packs/day: 0.00    Average packs/day: 0.1 packs/day for 2.0 years (0.2 ttl pk-yrs)    Types: Cigarettes    Start date:  12/25/1994    Quit date: 12/25/1996    Years since quitting: 27.4    Passive exposure: Never   Smokeless tobacco: Never   Tobacco comments:    smoked 2-3 cigs daily x 2 years  Substance Use Topics   Alcohol use: Yes    Alcohol/week: 0.0 standard drinks of alcohol    Comment: rarely    Family History  Problem Relation Age of Onset   Colon polyps Mother    Colon cancer Neg Hx    Stomach cancer Neg Hx    Esophageal cancer Neg Hx    Rectal cancer Neg Hx      Review of Systems  Musculoskeletal:  Positive for arthralgias, gait problem and joint swelling.  All other systems reviewed and are negative.   Objective:  Physical Exam Constitutional:      Appearance: Normal appearance.  HENT:     Head: Normocephalic and atraumatic.     Nose: Nose normal.     Mouth/Throat:     Mouth: Mucous membranes are moist.     Pharynx: Oropharynx is clear.  Eyes:     Conjunctiva/sclera: Conjunctivae normal.  Cardiovascular:     Rate and Rhythm: Normal rate and regular rhythm.     Pulses: Normal pulses.     Heart sounds: Normal heart sounds.  Pulmonary:     Effort: Pulmonary effort is normal.     Breath sounds: Normal breath sounds.  Abdominal:     General: Abdomen is flat.     Palpations: Abdomen is soft.   Genitourinary:    Comments: deferred Musculoskeletal:     Cervical back: Normal range of motion and neck supple.     Comments: Examination of the right knee reveals no skin wounds or lesions. There is swelling but no warmth, erythema, or effusion. Range of motion is 16 to 90 degrees. There is no ligamentous instability, but varus/valgus pseudolaxity is present. Painless range of motion of the hip.  Distally, there is no focal motor or sensory deficit. Palpable pedal pulses are present.  Skin:    General: Skin is warm and dry.     Capillary Refill: Capillary refill takes less than 2 seconds.  Neurological:     General: No focal deficit present.     Mental Status: She is alert and oriented to person, place, and time.  Psychiatric:        Mood and Affect: Mood normal.        Behavior: Behavior normal.        Thought Content: Thought content normal.        Judgment: Judgment normal.     Vital signs in last 24 hours: @VSRANGES @  Labs:   Estimated body mass index is 40.32 kg/m as calculated from the following:   Height as of 06/02/24: 5\' 10"  (1.778 m).   Weight as of 06/02/24: 127.5 kg.   Imaging Review Plain radiographs demonstrate severe degenerative joint disease of the right knee(s). The overall alignment issignificant varus. The bone quality appears to be adequate for age and reported activity level.      Assessment/Plan:  End stage arthritis, right knee   The patient history, physical examination, clinical judgment of the provider and imaging studies are consistent with end stage degenerative joint disease of the right knee(s) and total knee arthroplasty is deemed medically necessary. The treatment options including medical management, injection therapy arthroscopy and arthroplasty were discussed at length. The risks and benefits of total knee arthroplasty were presented and reviewed.  The risks due to aseptic loosening, infection, stiffness, patella tracking problems,  thromboembolic complications and other imponderables were discussed. The patient acknowledged the explanation, agreed to proceed with the plan and consent was signed. Patient is being admitted for inpatient treatment for surgery, pain control, PT, OT, prophylactic antibiotics, VTE prophylaxis, progressive ambulation and ADL's and discharge planning. The patient is planning to be discharged home with OPPT after an overnight stay.   Therapy Plans: outpatient therapy PT [facility] 06/16/24.  Disposition: Home with husband Robin [name].  Planned DVT Prophylaxis: aspirin  81mg  BID DME needed: Has rolling walker. Ice machine today.  PCP: Cleared.  Cardiology: Cleared.  Rheumatology: Cleared.  TXA: IV Allergies:  - Lisinopril - cough.  Anesthesia Concerns: Has woken up during surgery years ago. No other known issues.  BMI: 39.3 Last HgbA1c: 5.4.  Other: - OSA, uses CPAP.  - RA history, orencia  injection, weekly. Last injection 5/21/2, continue to hold prior to surgery. Will continue to hold until incision healed postop.  - Staples.  - Pain management, Dr. Rexanne Catalina. Hydrocodone  10/325 TID. Flexeril  PRN at night. Spinal stenosis. - Gastric sleeve. No NSAIDs.  - Oxycodone , zofran , methocarbamol .  - 04/29/24: Hgb 13.2, K+ 3.9, Cr. 0.73. - 06/02/24: Hgb 13.6, K+ 3.9, Cr. 0.76.  - [facility].     Patient's anticipated LOS is less than 2 midnights, meeting these requirements: - Younger than 27 - Lives within 1 hour of care - Has a competent adult at home to recover with post-op recover - NO history of  - Chronic pain requiring opiods  - Diabetes  - Coronary Artery Disease  - Heart failure  - Heart attack  - Stroke  - DVT/VTE  - Cardiac arrhythmia  - Respiratory Failure/COPD  - Renal failure  - Anemia  - Advanced Liver disease

## 2024-06-03 NOTE — H&P (View-Only) (Signed)
 TOTAL KNEE ADMISSION H&P  Patient is being admitted for right total knee arthroplasty.  Subjective:  Chief Complaint:right knee pain.  HPI: Robin [name], 53 y.o. female, has a history of pain and functional disability in the right knee due to arthritis and has failed non-surgical conservative treatments for greater than 12 weeks to includeNSAID's and/or analgesics, corticosteriod injections, flexibility and strengthening excercises, use of assistive devices, weight reduction as appropriate, and activity modification.  Onset of symptoms was gradual, starting 10 years ago with rapidlly worsening course since that time. The patient noted no past surgery on the right knee(s).  Patient currently rates pain in the right knee(s) at 10 out of 10 with activity. Patient has night pain, worsening of pain with activity and weight bearing, pain that interferes with activities of daily living, pain with passive range of motion, crepitus, and joint swelling.  Patient has evidence of subchondral cysts, subchondral sclerosis, periarticular osteophytes, and joint space narrowing by imaging studies. There is no active infection.  Patient Active Problem List   Diagnosis Date Noted   Myofascial pain 02/04/2024   Recurrent major depressive disorder, in partial remission (HCC) 11/01/2023   Status post gastric surgery 10/31/2023   Bilateral shoulder pain 04/25/2023   Dysphagia 04/17/2023   Gastroesophageal reflux disease without esophagitis 04/17/2023   Colon cancer screening 04/17/2023   Constipation 04/17/2023   Bilateral carpal tunnel syndrome 03/12/2023   Positive ANA (antinuclear antibody) 12/05/2022   Vitamin D deficiency 11/27/2022   High risk medication use 04/19/2022   Financial insecurity 02/13/2022   Anxiety and depression 01/30/2022   Hypercholesterolemia 01/30/2022   Primary hypertension 01/30/2022   Rheumatoid arthritis of multiple sites with negative rheumatoid factor (HCC) 01/30/2022    Osteoarthritis of left knee 06/01/2021   Lumbar spinal stenosis status post L2-L3 TLIF 05/05/2021   Degenerative scoliosis 04/18/2021   Sarcoidosis 05/10/2020   Pain of left hip joint 03/24/2020   Cervical spondylosis without myelopathy 07/16/2019   Neck pain 07/16/2019   Obesity, Class III, BMI 40-49.9 (morbid obesity) 10/18/2018   Primary osteoarthritis of both knees 10/18/2018   Hammertoe of right foot 07/02/2018   OSA (obstructive sleep apnea) 10/26/2014   Past Medical History:  Diagnosis Date   Allergy    Anxiety    Asthma    Bilateral carpal tunnel syndrome    Degeneration of lumbar intervertebral disc    Depression    GERD (gastroesophageal reflux disease)    Hyperlipidemia    Hypertension    OSA (obstructive sleep apnea)    has cpap   Osteoarthritis of both knees    Pneumonia    Rheumatoid arthritis (HCC)    Sarcoidosis    Spinal stenosis    Vitamin D deficiency     Past Surgical History:  Procedure Laterality Date   ABDOMINAL HYSTERECTOMY     DEBRIDEMENT AND CLOSURE WOUND N/A 06/15/2021   Procedure: Irrigation and debridement and closure of spine wound;  Surgeon: Mort Ards, MD;  Location: [facility];  Service: Orthopedics;  Laterality: N/A;   ESOPHAGOGASTRODUODENOSCOPY     FOOT SURGERY Bilateral    Both surgeries for bunions by Dr. Chalmers Columbus per pt   gastric sleeve surgery  10/16/2023   TRANSFORAMINAL LUMBAR INTERBODY FUSION (TLIF) WITH PEDICLE SCREW FIXATION 1 LEVEL N/A 05/05/2021   Procedure: TRANSFORAMINAL LUMBAR INTERBODY FUSION (TLIF) WITH PEDICLE SCREW FIXATION 1 LEVEL L2-3;  Surgeon: Mort Ards, MD;  Location: [facility] OR;  Service: Orthopedics;  Laterality: N/A;  4 hrs    Current  Outpatient Medications  Medication Sig Dispense Refill Last Dose/Taking   Abatacept  (ORENCIA  CLICKJECT) 125 MG/ML SOAJ Inject 125 mg into the skin once a week. 12 mL 0    acetaminophen  (TYLENOL ) 650 MG CR tablet Take 1,300 mg by mouth every 8 (eight) hours as needed (pain).       albuterol  (VENTOLIN  HFA) 108 (90 Base) MCG/ACT inhaler Inhale 2 puffs into the lungs every 6 (six) hours as needed for wheezing or shortness of breath. 18 g 2    amLODipine  (NORVASC ) 10 MG tablet Take 1 tablet (10 mg total) by mouth daily. 30 tablet 11    atorvastatin  (LIPITOR) 20 MG tablet Take 1 tablet (20 mg total) by mouth daily. 30 tablet 11    BIOTIN PO Take 1 tablet by mouth daily.      buPROPion  (WELLBUTRIN  SR) 200 MG 12 hr tablet Take 100-200 mg by mouth See admin instructions. Take 200 mg by mouth in the morning and take 100 mg in the evening      CALCIUM  PO Take 1 tablet by mouth daily.      carvedilol  (COREG ) 12.5 MG tablet Take 1 tablet (12.5 mg total) by mouth 2 (two) times daily with a meal. 60 tablet 5    cetirizine (ZYRTEC) 10 MG tablet Take 10 mg by mouth daily as needed for allergies.      cyclobenzaprine  (FLEXERIL ) 10 MG tablet Take 1 tablet (10 mg total) by mouth at bedtime as needed for muscle spasms. (Patient not taking: Reported on 05/29/2024) 30 tablet 2    diclofenac  Sodium (VOLTAREN ) 1 % GEL Apply 2 g topically daily as needed (pain).      escitalopram  (LEXAPRO ) 20 MG tablet Take 20 mg by mouth daily.      fluticasone (FLONASE) 50 MCG/ACT nasal spray Place 1 spray into both nostrils daily as needed for allergies.      GAVILAX 17 GM/SCOOP powder Take 119 g by mouth daily as needed for moderate constipation.      hydrALAZINE  (APRESOLINE ) 25 MG tablet Take 25 mg by mouth daily.      HYDROcodone -acetaminophen  (NORCO) 10-325 MG tablet Take 1 tablet by mouth in the morning, at noon, and at bedtime.      losartan -hydrochlorothiazide  (HYZAAR) 100-25 MG tablet Take 1 tablet by mouth daily. 90 tablet 3    Melatonin 10 MG CAPS Take 20 mg by mouth at bedtime as needed (sleep).      metroNIDAZOLE  (FLAGYL ) 500 MG tablet Take 1 tablet (500 mg total) by mouth 2 (two) times daily. (Patient not taking: Reported on 04/28/2024) 14 tablet 0    Multiple Vitamin (MULTIVITAMIN ADULT PO) Take 1  tablet by mouth daily.      omeprazole (PRILOSEC) 40 MG capsule Take 40 mg by mouth daily.      OVER THE COUNTER MEDICATION Take 1 tablet by mouth daily. Menopause Supplement      pregabalin  (LYRICA ) 75 MG capsule Take 2 capsules (150 mg total) by mouth 2 (two) times daily. 120 capsule 2    triamcinolone  lotion (KENALOG ) 0.1 % Apply 1 Application topically 3 (three) times daily.      No current facility-administered medications for this visit.   Allergies  Allergen Reactions   Lisinopril Cough    Social History   Tobacco Use   Smoking status: Former    Current packs/day: 0.00    Average packs/day: 0.1 packs/day for 2.0 years (0.2 ttl pk-yrs)    Types: Cigarettes    Start date:  12/25/1994    Quit date: 12/25/1996    Years since quitting: 27.4    Passive exposure: Never   Smokeless tobacco: Never   Tobacco comments:    smoked 2-3 cigs daily x 2 years  Substance Use Topics   Alcohol use: Yes    Alcohol/week: 0.0 standard drinks of alcohol    Comment: rarely    Family History  Problem Relation Age of Onset   Colon polyps Mother    Colon cancer Neg Hx    Stomach cancer Neg Hx    Esophageal cancer Neg Hx    Rectal cancer Neg Hx      Review of Systems  Musculoskeletal:  Positive for arthralgias, gait problem and joint swelling.  All other systems reviewed and are negative.   Objective:  Physical Exam Constitutional:      Appearance: Normal appearance.  HENT:     Head: Normocephalic and atraumatic.     Nose: Nose normal.     Mouth/Throat:     Mouth: Mucous membranes are moist.     Pharynx: Oropharynx is clear.  Eyes:     Conjunctiva/sclera: Conjunctivae normal.  Cardiovascular:     Rate and Rhythm: Normal rate and regular rhythm.     Pulses: Normal pulses.     Heart sounds: Normal heart sounds.  Pulmonary:     Effort: Pulmonary effort is normal.     Breath sounds: Normal breath sounds.  Abdominal:     General: Abdomen is flat.     Palpations: Abdomen is soft.   Genitourinary:    Comments: deferred Musculoskeletal:     Cervical back: Normal range of motion and neck supple.     Comments: Examination of the right knee reveals no skin wounds or lesions. There is swelling but no warmth, erythema, or effusion. Range of motion is 16 to 90 degrees. There is no ligamentous instability, but varus/valgus pseudolaxity is present. Painless range of motion of the hip.  Distally, there is no focal motor or sensory deficit. Palpable pedal pulses are present.  Skin:    General: Skin is warm and dry.     Capillary Refill: Capillary refill takes less than 2 seconds.  Neurological:     General: No focal deficit present.     Mental Status: She is alert and oriented to person, place, and time.  Psychiatric:        Mood and Affect: Mood normal.        Behavior: Behavior normal.        Thought Content: Thought content normal.        Judgment: Judgment normal.     Vital signs in last 24 hours: @VSRANGES @  Labs:   Estimated body mass index is 40.32 kg/m as calculated from the following:   Height as of 06/02/24: 5\' 10"  (1.778 m).   Weight as of 06/02/24: 127.5 kg.   Imaging Review Plain radiographs demonstrate severe degenerative joint disease of the right knee(s). The overall alignment issignificant varus. The bone quality appears to be adequate for age and reported activity level.      Assessment/Plan:  End stage arthritis, right knee   The patient history, physical examination, clinical judgment of the provider and imaging studies are consistent with end stage degenerative joint disease of the right knee(s) and total knee arthroplasty is deemed medically necessary. The treatment options including medical management, injection therapy arthroscopy and arthroplasty were discussed at length. The risks and benefits of total knee arthroplasty were presented and reviewed.  The risks due to aseptic loosening, infection, stiffness, patella tracking problems,  thromboembolic complications and other imponderables were discussed. The patient acknowledged the explanation, agreed to proceed with the plan and consent was signed. Patient is being admitted for inpatient treatment for surgery, pain control, PT, OT, prophylactic antibiotics, VTE prophylaxis, progressive ambulation and ADL's and discharge planning. The patient is planning to be discharged home with OPPT after an overnight stay.   Therapy Plans: outpatient therapy PT [facility] 06/16/24.  Disposition: Home with husband Abou.  Planned DVT Prophylaxis: aspirin  81mg  BID DME needed: Has rolling walker. Ice machine today.  PCP: Cleared.  Cardiology: Cleared.  Rheumatology: Cleared.  TXA: IV Allergies:  - Lisinopril - cough.  Anesthesia Concerns: Has woken up during surgery years ago. No other known issues.  BMI: 39.3 Last HgbA1c: 5.4.  Other: - OSA, uses CPAP.  - RA history, orencia  injection, weekly. Last injection 5/21/2, continue to hold prior to surgery. Will continue to hold until incision healed postop.  - Staples.  - Pain management, Dr. Rexanne Catalina. Hydrocodone  10/325 TID. Flexeril  PRN at night. Spinal stenosis. - Gastric sleeve. No NSAIDs.  - Oxycodone , zofran , methocarbamol .  - 04/29/24: Hgb 13.2, K+ 3.9, Cr. 0.73. - 06/02/24: Hgb 13.6, K+ 3.9, Cr. 0.76.  - [facility].     Patient's anticipated LOS is less than 2 midnights, meeting these requirements: - Younger than 27 - Lives within 1 hour of care - Has a competent adult at home to recover with post-op recover - NO history of  - Chronic pain requiring opiods  - Diabetes  - Coronary Artery Disease  - Heart failure  - Heart attack  - Stroke  - DVT/VTE  - Cardiac arrhythmia  - Respiratory Failure/COPD  - Renal failure  - Anemia  - Advanced Liver disease

## 2024-06-11 ENCOUNTER — Ambulatory Visit (HOSPITAL_COMMUNITY): Admitting: Anesthesiology

## 2024-06-11 ENCOUNTER — Encounter (HOSPITAL_COMMUNITY)
Admission: RE | Disposition: A | Discharge status: Home / Self Care | Payer: Self-pay | Source: Home / Self Care | Attending: Orthopedic Surgery

## 2024-06-11 ENCOUNTER — Other Ambulatory Visit: Payer: Self-pay

## 2024-06-11 ENCOUNTER — Ambulatory Visit (HOSPITAL_COMMUNITY)

## 2024-06-11 ENCOUNTER — Ambulatory Visit (HOSPITAL_COMMUNITY)
Admission: RE | Admit: 2024-06-11 | Discharge: 2024-06-12 | Disposition: A | Discharge status: Home / Self Care | Attending: Orthopedic Surgery | Admitting: Orthopedic Surgery

## 2024-06-11 ENCOUNTER — Ambulatory Visit (HOSPITAL_COMMUNITY): Payer: Self-pay | Admitting: Physician Assistant

## 2024-06-11 ENCOUNTER — Encounter (HOSPITAL_COMMUNITY): Payer: Self-pay | Admitting: Orthopedic Surgery

## 2024-06-11 DIAGNOSIS — I10 Essential (primary) hypertension: Secondary | ICD-10-CM | POA: Diagnosis not present

## 2024-06-11 DIAGNOSIS — G4733 Obstructive sleep apnea (adult) (pediatric): Secondary | ICD-10-CM | POA: Insufficient documentation

## 2024-06-11 DIAGNOSIS — M1711 Unilateral primary osteoarthritis, right knee: Secondary | ICD-10-CM | POA: Diagnosis present

## 2024-06-11 DIAGNOSIS — Z87891 Personal history of nicotine dependence: Secondary | ICD-10-CM | POA: Insufficient documentation

## 2024-06-11 DIAGNOSIS — K219 Gastro-esophageal reflux disease without esophagitis: Secondary | ICD-10-CM | POA: Insufficient documentation

## 2024-06-11 DIAGNOSIS — Z01818 Encounter for other preprocedural examination: Secondary | ICD-10-CM

## 2024-06-11 DIAGNOSIS — Z96651 Presence of right artificial knee joint: Secondary | ICD-10-CM

## 2024-06-11 HISTORY — PX: KNEE ARTHROPLASTY: SHX992

## 2024-06-11 SURGERY — ARTHROPLASTY, KNEE, TOTAL, USING IMAGELESS COMPUTER-ASSISTED NAVIGATION
Anesthesia: Spinal | Site: Knee | Laterality: Right

## 2024-06-11 MED ORDER — CEFAZOLIN SODIUM-DEXTROSE 2-4 GM/100ML-% IV SOLN
2.0000 g | Freq: Four times a day (QID) | INTRAVENOUS | Status: AC
  Administered 2024-06-11 – 2024-06-12 (×2): 2 g via INTRAVENOUS
  Filled 2024-06-11 (×2): qty 100

## 2024-06-11 MED ORDER — FENTANYL CITRATE PF 50 MCG/ML IJ SOSY
PREFILLED_SYRINGE | INTRAMUSCULAR | Status: AC
  Filled 2024-06-11: qty 1

## 2024-06-11 MED ORDER — EPHEDRINE SULFATE (PRESSORS) 50 MG/ML IJ SOLN
INTRAMUSCULAR | Status: DC | PRN
  Administered 2024-06-11 (×3): 10 mg via INTRAVENOUS

## 2024-06-11 MED ORDER — ALBUTEROL SULFATE (2.5 MG/3ML) 0.083% IN NEBU: 3.0000 mL | INHALATION_SOLUTION | Freq: Four times a day (QID) | RESPIRATORY_TRACT | Status: DC | PRN

## 2024-06-11 MED ORDER — KETAMINE HCL 50 MG/5ML IJ SOSY
PREFILLED_SYRINGE | INTRAMUSCULAR | Status: AC
  Filled 2024-06-11: qty 5

## 2024-06-11 MED ORDER — BUPROPION HCL ER (SR) 100 MG PO TB12
200.0000 mg | ORAL_TABLET | Freq: Every day | ORAL | Status: DC
  Administered 2024-06-12: 200 mg via ORAL
  Filled 2024-06-11: qty 2

## 2024-06-11 MED ORDER — DOCUSATE SODIUM 100 MG PO CAPS
100.0000 mg | ORAL_CAPSULE | Freq: Two times a day (BID) | ORAL | Status: DC
  Administered 2024-06-11 – 2024-06-12 (×2): 100 mg via ORAL
  Filled 2024-06-11 (×2): qty 1

## 2024-06-11 MED ORDER — MELATONIN 5 MG PO TABS
20.0000 mg | ORAL_TABLET | Freq: Every evening | ORAL | Status: DC | PRN
  Filled 2024-06-11: qty 4

## 2024-06-11 MED ORDER — MIDAZOLAM HCL 2 MG/2ML IJ SOLN
2.0000 mg | INTRAMUSCULAR | Status: DC
  Administered 2024-06-11: 1 mg via INTRAVENOUS
  Filled 2024-06-11: qty 2

## 2024-06-11 MED ORDER — OXYCODONE HCL 5 MG PO TABS: 10.0000 mg | ORAL_TABLET | ORAL | Status: DC | PRN

## 2024-06-11 MED ORDER — ONDANSETRON HCL 4 MG PO TABS: 4.0000 mg | ORAL_TABLET | Freq: Four times a day (QID) | ORAL | Status: DC | PRN

## 2024-06-11 MED ORDER — CARVEDILOL 12.5 MG PO TABS
12.5000 mg | ORAL_TABLET | Freq: Two times a day (BID) | ORAL | Status: DC
  Administered 2024-06-12: 12.5 mg via ORAL
  Filled 2024-06-11: qty 1

## 2024-06-11 MED ORDER — OXYCODONE HCL 5 MG/5ML PO SOLN: 5.0000 mg | Freq: Once | ORAL | Status: AC | PRN

## 2024-06-11 MED ORDER — KETOROLAC TROMETHAMINE 15 MG/ML IJ SOLN
15.0000 mg | Freq: Four times a day (QID) | INTRAMUSCULAR | Status: AC
Start: 2024-06-11 — End: 2024-06-12
  Administered 2024-06-11 – 2024-06-12 (×4): 15 mg via INTRAVENOUS
  Filled 2024-06-11 (×4): qty 1

## 2024-06-11 MED ORDER — ASPIRIN 81 MG PO CHEW
81.0000 mg | CHEWABLE_TABLET | Freq: Two times a day (BID) | ORAL | Status: DC
  Administered 2024-06-11 – 2024-06-12 (×2): 81 mg via ORAL
  Filled 2024-06-11 (×2): qty 1

## 2024-06-11 MED ORDER — POLYETHYLENE GLYCOL 3350 17 G PO PACK: 17.0000 g | PACK | Freq: Every day | ORAL | Status: DC | PRN

## 2024-06-11 MED ORDER — OXYCODONE HCL 5 MG PO TABS
5.0000 mg | ORAL_TABLET | ORAL | Status: DC | PRN
  Administered 2024-06-11: 5 mg via ORAL
  Administered 2024-06-12: 10 mg via ORAL
  Filled 2024-06-11: qty 2
  Filled 2024-06-11: qty 1

## 2024-06-11 MED ORDER — TRANEXAMIC ACID-NACL 1000-0.7 MG/100ML-% IV SOLN
1000.0000 mg | INTRAVENOUS | Status: AC
  Administered 2024-06-11: 1000 mg via INTRAVENOUS
  Filled 2024-06-11: qty 100

## 2024-06-11 MED ORDER — CLONIDINE HCL (ANALGESIA) 100 MCG/ML EP SOLN
EPIDURAL | Status: DC | PRN
  Administered 2024-06-11: 50 ug

## 2024-06-11 MED ORDER — ACETAMINOPHEN 500 MG PO TABS
1000.0000 mg | ORAL_TABLET | Freq: Once | ORAL | Status: AC
  Administered 2024-06-11: 1000 mg via ORAL
  Filled 2024-06-11: qty 2

## 2024-06-11 MED ORDER — METOCLOPRAMIDE HCL 5 MG PO TABS: 5.0000 mg | ORAL_TABLET | Freq: Three times a day (TID) | ORAL | Status: DC | PRN

## 2024-06-11 MED ORDER — FENTANYL CITRATE (PF) 100 MCG/2ML IJ SOLN
INTRAMUSCULAR | Status: AC
Start: 2024-06-11 — End: 2024-06-11
  Filled 2024-06-11: qty 2

## 2024-06-11 MED ORDER — PHENOL 1.4 % MT LIQD: 1.0000 | OROMUCOSAL | Status: DC | PRN

## 2024-06-11 MED ORDER — ALUM & MAG HYDROXIDE-SIMETH 200-200-20 MG/5ML PO SUSP: 30.0000 mL | ORAL | Status: DC | PRN

## 2024-06-11 MED ORDER — SENNA 8.6 MG PO TABS
1.0000 | ORAL_TABLET | Freq: Two times a day (BID) | ORAL | Status: DC
  Administered 2024-06-11 – 2024-06-12 (×2): 8.6 mg via ORAL
  Filled 2024-06-11 (×2): qty 1

## 2024-06-11 MED ORDER — PROPOFOL 10 MG/ML IV BOLUS
INTRAVENOUS | Status: AC
  Filled 2024-06-11: qty 20

## 2024-06-11 MED ORDER — ONDANSETRON HCL 4 MG/2ML IJ SOLN
INTRAMUSCULAR | Status: AC
  Filled 2024-06-11: qty 2

## 2024-06-11 MED ORDER — ACETAMINOPHEN 500 MG PO TABS
1000.0000 mg | ORAL_TABLET | Freq: Four times a day (QID) | ORAL | Status: AC
Start: 2024-06-11 — End: 2024-06-12
  Administered 2024-06-11 – 2024-06-12 (×4): 1000 mg via ORAL
  Filled 2024-06-11 (×4): qty 2

## 2024-06-11 MED ORDER — PRONTOSAN WOUND IRRIGATION OPTIME
TOPICAL | Status: DC | PRN
  Administered 2024-06-11: 1 via TOPICAL

## 2024-06-11 MED ORDER — METHOCARBAMOL 500 MG PO TABS
500.0000 mg | ORAL_TABLET | Freq: Four times a day (QID) | ORAL | Status: DC | PRN
  Administered 2024-06-11 (×2): 500 mg via ORAL
  Filled 2024-06-11: qty 1

## 2024-06-11 MED ORDER — PROPOFOL 500 MG/50ML IV EMUL
INTRAVENOUS | Status: DC | PRN
  Administered 2024-06-11: 25 ug/kg/min via INTRAVENOUS

## 2024-06-11 MED ORDER — ALBUMIN HUMAN 5 % IV SOLN
INTRAVENOUS | Status: AC
  Filled 2024-06-11: qty 250

## 2024-06-11 MED ORDER — PREGABALIN 75 MG PO CAPS
150.0000 mg | ORAL_CAPSULE | Freq: Two times a day (BID) | ORAL | Status: DC
  Administered 2024-06-11 – 2024-06-12 (×2): 150 mg via ORAL
  Filled 2024-06-11 (×2): qty 2

## 2024-06-11 MED ORDER — LACTATED RINGERS IV SOLN: INTRAVENOUS | Status: DC

## 2024-06-11 MED ORDER — BUPROPION HCL ER (SR) 100 MG PO TB12: 100.0000 mg | ORAL_TABLET | ORAL | Status: DC

## 2024-06-11 MED ORDER — HYDROMORPHONE HCL 1 MG/ML IJ SOLN
0.5000 mg | INTRAMUSCULAR | Status: DC | PRN
  Administered 2024-06-12: 0.5 mg via INTRAVENOUS
  Filled 2024-06-11: qty 1

## 2024-06-11 MED ORDER — LIDOCAINE HCL (CARDIAC) PF 100 MG/5ML IV SOSY
PREFILLED_SYRINGE | INTRAVENOUS | Status: DC | PRN
Start: 2024-06-11 — End: 2024-06-11
  Administered 2024-06-11: 80 mg via INTRAVENOUS

## 2024-06-11 MED ORDER — CHLORHEXIDINE GLUCONATE 0.12 % MT SOLN
15.0000 mL | Freq: Once | OROMUCOSAL | Status: AC
  Administered 2024-06-11: 15 mL via OROMUCOSAL

## 2024-06-11 MED ORDER — MENTHOL 3 MG MT LOZG: 1.0000 | LOZENGE | OROMUCOSAL | Status: DC | PRN

## 2024-06-11 MED ORDER — POVIDONE-IODINE 10 % EX SWAB
2.0000 | Freq: Once | CUTANEOUS | Status: AC
  Administered 2024-06-11: 2 via TOPICAL

## 2024-06-11 MED ORDER — BISACODYL 10 MG RE SUPP: 10.0000 mg | Freq: Every day | RECTAL | Status: DC | PRN

## 2024-06-11 MED ORDER — HYDROMORPHONE HCL 2 MG/ML IJ SOLN
INTRAMUSCULAR | Status: AC
  Filled 2024-06-11: qty 1

## 2024-06-11 MED ORDER — SODIUM CHLORIDE 0.9 % IV SOLN: INTRAVENOUS | Status: DC

## 2024-06-11 MED ORDER — FENTANYL CITRATE PF 50 MCG/ML IJ SOSY
100.0000 ug | PREFILLED_SYRINGE | INTRAMUSCULAR | Status: DC
  Administered 2024-06-11: 50 ug via INTRAVENOUS
  Filled 2024-06-11: qty 2

## 2024-06-11 MED ORDER — DIPHENHYDRAMINE HCL 12.5 MG/5ML PO ELIX: 12.5000 mg | ORAL_SOLUTION | ORAL | Status: DC | PRN

## 2024-06-11 MED ORDER — ESCITALOPRAM OXALATE 20 MG PO TABS
20.0000 mg | ORAL_TABLET | Freq: Every day | ORAL | Status: DC
  Administered 2024-06-12: 20 mg via ORAL
  Filled 2024-06-11: qty 1

## 2024-06-11 MED ORDER — ALBUMIN HUMAN 5 % IV SOLN: INTRAVENOUS | Status: DC | PRN

## 2024-06-11 MED ORDER — SODIUM CHLORIDE 0.9 % IR SOLN
Status: DC | PRN
  Administered 2024-06-11: 250 mL
  Administered 2024-06-11: 3000 mL

## 2024-06-11 MED ORDER — ONDANSETRON HCL 4 MG/2ML IJ SOLN
INTRAMUSCULAR | Status: DC | PRN
  Administered 2024-06-11: 4 mg via INTRAVENOUS

## 2024-06-11 MED ORDER — KETAMINE HCL 50 MG/5ML IJ SOSY
PREFILLED_SYRINGE | INTRAMUSCULAR | Status: DC | PRN
  Administered 2024-06-11: 30 mg via INTRAVENOUS

## 2024-06-11 MED ORDER — OXYCODONE HCL 5 MG PO TABS
5.0000 mg | ORAL_TABLET | Freq: Once | ORAL | Status: AC | PRN
  Administered 2024-06-11: 5 mg via ORAL

## 2024-06-11 MED ORDER — LORATADINE 10 MG PO TABS
10.0000 mg | ORAL_TABLET | Freq: Every day | ORAL | Status: DC
  Administered 2024-06-11 – 2024-06-12 (×2): 10 mg via ORAL
  Filled 2024-06-11 (×2): qty 1

## 2024-06-11 MED ORDER — ONDANSETRON HCL 4 MG/2ML IJ SOLN: 4.0000 mg | Freq: Four times a day (QID) | INTRAMUSCULAR | Status: DC | PRN

## 2024-06-11 MED ORDER — LACTATED RINGERS IV SOLN: INTRAVENOUS | Status: DC | PRN

## 2024-06-11 MED ORDER — CEFAZOLIN SODIUM-DEXTROSE 3-4 GM/150ML-% IV SOLN
3.0000 g | INTRAVENOUS | Status: AC
  Administered 2024-06-11: 3 g via INTRAVENOUS
  Filled 2024-06-11: qty 150

## 2024-06-11 MED ORDER — HYDROMORPHONE HCL 1 MG/ML IJ SOLN
INTRAMUSCULAR | Status: DC | PRN
  Administered 2024-06-11 (×3): .2 mg via INTRAVENOUS

## 2024-06-11 MED ORDER — LIDOCAINE 2% (20 MG/ML) 5 ML SYRINGE
INTRAMUSCULAR | Status: DC | PRN
  Administered 2024-06-11: 100 mg via INTRAVENOUS

## 2024-06-11 MED ORDER — FLUTICASONE PROPIONATE 50 MCG/ACT NA SUSP: 1.0000 | Freq: Every day | NASAL | Status: DC | PRN

## 2024-06-11 MED ORDER — DEXAMETHASONE SODIUM PHOSPHATE 10 MG/ML IJ SOLN
INTRAMUSCULAR | Status: AC
Start: 2024-06-11 — End: 2024-06-11
  Filled 2024-06-11: qty 1

## 2024-06-11 MED ORDER — DEXAMETHASONE SODIUM PHOSPHATE 10 MG/ML IJ SOLN
INTRAMUSCULAR | Status: DC | PRN
  Administered 2024-06-11: 8 mg via INTRAVENOUS

## 2024-06-11 MED ORDER — BUPROPION HCL ER (SR) 100 MG PO TB12
100.0000 mg | ORAL_TABLET | Freq: Every day | ORAL | Status: DC
  Administered 2024-06-11: 100 mg via ORAL
  Filled 2024-06-11 (×2): qty 1

## 2024-06-11 MED ORDER — SODIUM CHLORIDE (PF) 0.9 % IJ SOLN
INTRAMUSCULAR | Status: DC | PRN
  Administered 2024-06-11: 61 mL

## 2024-06-11 MED ORDER — PANTOPRAZOLE SODIUM 40 MG PO TBEC
40.0000 mg | DELAYED_RELEASE_TABLET | Freq: Every day | ORAL | Status: DC
  Administered 2024-06-11 – 2024-06-12 (×2): 40 mg via ORAL
  Filled 2024-06-11 (×2): qty 1

## 2024-06-11 MED ORDER — DROPERIDOL 2.5 MG/ML IJ SOLN: 0.6250 mg | Freq: Once | INTRAMUSCULAR | Status: DC | PRN

## 2024-06-11 MED ORDER — METHOCARBAMOL 500 MG PO TABS
ORAL_TABLET | ORAL | Status: AC
  Filled 2024-06-11: qty 1

## 2024-06-11 MED ORDER — POVIDONE-IODINE 10 % EX SWAB: 2.0000 | Freq: Once | CUTANEOUS | Status: DC

## 2024-06-11 MED ORDER — METOCLOPRAMIDE HCL 5 MG/ML IJ SOLN: 5.0000 mg | Freq: Three times a day (TID) | INTRAMUSCULAR | Status: DC | PRN

## 2024-06-11 MED ORDER — METHOCARBAMOL 1000 MG/10ML IJ SOLN: 500.0000 mg | Freq: Four times a day (QID) | INTRAMUSCULAR | Status: DC | PRN

## 2024-06-11 MED ORDER — ORAL CARE MOUTH RINSE
15.0000 mL | Freq: Once | OROMUCOSAL | Status: AC
Start: 2024-06-11 — End: 2024-06-11

## 2024-06-11 MED ORDER — FENTANYL CITRATE PF 50 MCG/ML IJ SOSY
25.0000 ug | PREFILLED_SYRINGE | INTRAMUSCULAR | Status: DC | PRN
  Administered 2024-06-11 (×3): 50 ug via INTRAVENOUS

## 2024-06-11 MED ORDER — ROPIVACAINE HCL 5 MG/ML IJ SOLN
INTRAMUSCULAR | Status: DC | PRN
  Administered 2024-06-11: 20 mL via PERINEURAL

## 2024-06-11 MED ORDER — OXYCODONE HCL 5 MG PO TABS
ORAL_TABLET | ORAL | Status: AC
  Filled 2024-06-11: qty 1

## 2024-06-11 MED ORDER — ATORVASTATIN CALCIUM 20 MG PO TABS
20.0000 mg | ORAL_TABLET | Freq: Every day | ORAL | Status: DC
  Administered 2024-06-12: 20 mg via ORAL
  Filled 2024-06-11: qty 1

## 2024-06-11 MED ORDER — GLYCOPYRROLATE 0.2 MG/ML IJ SOLN
INTRAMUSCULAR | Status: DC | PRN
  Administered 2024-06-11: .2 mg via INTRAVENOUS

## 2024-06-11 MED ORDER — FENTANYL CITRATE PF 50 MCG/ML IJ SOSY
PREFILLED_SYRINGE | INTRAMUSCULAR | Status: AC
Start: 2024-06-11 — End: 2024-06-11
  Filled 2024-06-11: qty 1

## 2024-06-11 MED ORDER — 0.9 % SODIUM CHLORIDE (POUR BTL) OPTIME
TOPICAL | Status: DC | PRN
  Administered 2024-06-11: 1000 mL

## 2024-06-11 MED ORDER — ACETAMINOPHEN 325 MG PO TABS: 325.0000 mg | ORAL_TABLET | Freq: Four times a day (QID) | ORAL | Status: DC | PRN

## 2024-06-11 MED ORDER — PROPOFOL 10 MG/ML IV BOLUS
INTRAVENOUS | Status: DC | PRN
  Administered 2024-06-11: 200 mg via INTRAVENOUS

## 2024-06-11 MED ORDER — FENTANYL CITRATE (PF) 250 MCG/5ML IJ SOLN
INTRAMUSCULAR | Status: DC | PRN
  Administered 2024-06-11 (×2): 50 ug via INTRAVENOUS

## 2024-06-11 SURGICAL SUPPLY — 59 items
BAG COUNTER SPONGE SURGICOUNT (BAG) IMPLANT
BAG ZIPLOCK 12X15 (MISCELLANEOUS) IMPLANT
BATTERY INSTRU NAVIGATION (MISCELLANEOUS) ×3 IMPLANT
BLADE SAW RECIPROCATING 77.5 (BLADE) ×1 IMPLANT
BLADE SAW SAG 35X64 .89 (BLADE) ×1 IMPLANT
BNDG ELASTIC 4INX 5YD STR LF (GAUZE/BANDAGES/DRESSINGS) ×1 IMPLANT
BNDG ELASTIC 6INX 5YD STR LF (GAUZE/BANDAGES/DRESSINGS) ×1 IMPLANT
CHLORAPREP W/TINT 26 (MISCELLANEOUS) ×2 IMPLANT
COMPONENT FEM PS KN STD 10 RT (Joint) IMPLANT
COMPONENT PATELLA 3 PEG 38 (Joint) IMPLANT
COMPONENT TIB KNEE PS G 0 RT (Joint) IMPLANT
COVER SURGICAL LIGHT HANDLE (MISCELLANEOUS) ×1 IMPLANT
DERMABOND ADVANCED .7 DNX12 (GAUZE/BANDAGES/DRESSINGS) ×2 IMPLANT
DRAPE SHEET LG 3/4 BI-LAMINATE (DRAPES) ×3 IMPLANT
DRAPE U-SHAPE 47X51 STRL (DRAPES) ×1 IMPLANT
DRSG AQUACEL AG ADV 3.5X10 (GAUZE/BANDAGES/DRESSINGS) ×1 IMPLANT
ELECT BLADE TIP CTD 4 INCH (ELECTRODE) ×1 IMPLANT
ELECT REM PT RETURN 15FT ADLT (MISCELLANEOUS) ×1 IMPLANT
GAUZE SPONGE 4X4 12PLY STRL (GAUZE/BANDAGES/DRESSINGS) ×1 IMPLANT
GLOVE BIO SURGEON STRL SZ7 (GLOVE) ×1 IMPLANT
GLOVE BIO SURGEON STRL SZ8.5 (GLOVE) ×2 IMPLANT
GLOVE BIOGEL PI IND STRL 7.5 (GLOVE) ×1 IMPLANT
GLOVE BIOGEL PI IND STRL 8.5 (GLOVE) ×1 IMPLANT
GOWN SPEC L3 XXLG W/TWL (GOWN DISPOSABLE) ×1 IMPLANT
GOWN STRL REUS W/ TWL XL LVL3 (GOWN DISPOSABLE) ×1 IMPLANT
HOLDER FOLEY CATH W/STRAP (MISCELLANEOUS) ×1 IMPLANT
HOOD PEEL AWAY T7 (MISCELLANEOUS) ×3 IMPLANT
INSERT TIB ASF PS 7-12 10 GH (Insert) IMPLANT
KIT TURNOVER KIT A (KITS) ×2 IMPLANT
MARKER SKIN DUAL TIP RULER LAB (MISCELLANEOUS) ×1 IMPLANT
NDL SAFETY ECLIPSE 18X1.5 (NEEDLE) ×1 IMPLANT
NDL SPNL 18GX3.5 QUINCKE PK (NEEDLE) ×1 IMPLANT
NEEDLE SPNL 18GX3.5 QUINCKE PK (NEEDLE) ×1 IMPLANT
NS IRRIG 1000ML POUR BTL (IV SOLUTION) ×1 IMPLANT
PACK TOTAL KNEE CUSTOM (KITS) ×1 IMPLANT
PADDING CAST COTTON 6X4 STRL (CAST SUPPLIES) ×1 IMPLANT
PENCIL SMOKE EVACUATOR (MISCELLANEOUS) IMPLANT
PIN DRILL HDLS TROCAR 75 4PK (PIN) IMPLANT
PROTECTOR NERVE ULNAR (MISCELLANEOUS) ×1 IMPLANT
SAW OSC TIP CART 19.5X105X1.3 (SAW) ×1 IMPLANT
SCREW FEMALE HEX FIX 25X2.5 (ORTHOPEDIC DISPOSABLE SUPPLIES) IMPLANT
SEALER BIPOLAR AQUA 6.0 (INSTRUMENTS) ×1 IMPLANT
SET HNDPC FAN SPRY TIP SCT (DISPOSABLE) ×1 IMPLANT
SET PAD KNEE POSITIONER (MISCELLANEOUS) ×1 IMPLANT
SOLUTION PRONTOSAN WOUND 350ML (IRRIGATION / IRRIGATOR) ×1 IMPLANT
SPIKE FLUID TRANSFER (MISCELLANEOUS) ×2 IMPLANT
STAPLER SKIN PROX 35W (STAPLE) IMPLANT
SUT MNCRL AB 3-0 PS2 18 (SUTURE) ×1 IMPLANT
SUT MON AB 2-0 CT1 36 (SUTURE) ×1 IMPLANT
SUT STRATAFIX 14 PDO 48 VLT (SUTURE) ×1 IMPLANT
SUT VIC AB 1 CTX36XBRD ANBCTR (SUTURE) ×2 IMPLANT
SUT VIC AB 2-0 CT1 TAPERPNT 27 (SUTURE) ×1 IMPLANT
SYR 3ML LL SCALE MARK (SYRINGE) ×1 IMPLANT
TOWEL GREEN STERILE FF (TOWEL DISPOSABLE) ×1 IMPLANT
TRAY FOLEY MTR SLVR 14FR STAT (SET/KITS/TRAYS/PACK) IMPLANT
TRAY FOLEY MTR SLVR 16FR STAT (SET/KITS/TRAYS/PACK) IMPLANT
TUBE SUCTION HIGH CAP CLEAR NV (SUCTIONS) ×1 IMPLANT
WATER STERILE IRR 1000ML POUR (IV SOLUTION) ×2 IMPLANT
WRAP KNEE MAXI GEL POST OP (GAUZE/BANDAGES/DRESSINGS) IMPLANT

## 2024-06-11 NOTE — Plan of Care (Signed)
  Problem: Nutrition: Goal: Adequate nutrition will be maintained Outcome: Progressing   Problem: Coping: Goal: Level of anxiety will decrease Outcome: Progressing   Problem: Pain Managment: Goal: General experience of comfort will improve and/or be controlled Outcome: Progressing   Problem: Safety: Goal: Ability to remain free from injury will improve Outcome: Progressing

## 2024-06-11 NOTE — Interval H&P Note (Signed)
 History and Physical Interval Note:  06/11/2024 11:09 AM  Robin [name]  has presented today for surgery, with the diagnosis of Right knee osteoarthritis.  The various methods of treatment have been discussed with the patient and family. After consideration of risks, benefits and other options for treatment, the patient has consented to  Procedure(s): ARTHROPLASTY, KNEE, TOTAL, USING IMAGELESS COMPUTER-ASSISTED NAVIGATION (Right) as a surgical intervention.  The patient's history has been reviewed, patient examined, no change in status, stable for surgery.  I have reviewed the patient's chart and labs.  Questions were answered to the patient's satisfaction.     Margart Shears Raynold Blankenbaker

## 2024-06-11 NOTE — Anesthesia Procedure Notes (Signed)
 Procedure Name: LMA Insertion Date/Time: 06/11/2024 12:33 PM  Performed by: Rolene Andrades, CRNAPre-anesthesia Checklist: Patient identified, Emergency Drugs available, Suction available and Patient being monitored Patient Re-evaluated:Patient Re-evaluated prior to induction Oxygen Delivery Method: Circle system utilized Preoxygenation: Pre-oxygenation with 100% oxygen Induction Type: IV induction Ventilation: Mask ventilation without difficulty LMA: LMA with gastric port inserted LMA Size: 4.0 Tube type: Oral Number of attempts: 1 Airway Equipment and Method: Stylet and Oral airway Placement Confirmation: positive ETCO2 and breath sounds checked- equal and bilateral Tube secured with: Tape Dental Injury: Teeth and Oropharynx as per pre-operative assessment

## 2024-06-11 NOTE — Discharge Instructions (Signed)

## 2024-06-11 NOTE — Transfer of Care (Signed)
 Immediate Anesthesia Transfer of Care Note  Patient: Robin [name]  Procedure(s) Performed: ARTHROPLASTY, KNEE, TOTAL, USING IMAGELESS COMPUTER-ASSISTED NAVIGATION (Right: Knee)  Patient Location: PACU  Anesthesia Type:General  Level of Consciousness: awake and alert   Airway & Oxygen Therapy: Patient Spontanous Breathing and Patient connected to nasal cannula oxygen  Post-op Assessment: Report given to RN and Post -op Vital signs reviewed and stable  Post vital signs: Reviewed and stable  Last Vitals:  Vitals Value Taken Time  BP 130/76 06/11/24 15:40  Temp    Pulse 70 06/11/24 15:43  Resp 12 06/11/24 15:43  SpO2 95 % 06/11/24 15:43  Vitals shown include unfiled device data.  Last Pain:  Vitals:   06/11/24 1125  TempSrc:   PainSc: 0-No pain         Complications: No notable events documented.

## 2024-06-11 NOTE — Anesthesia Procedure Notes (Signed)
 Anesthesia Regional Block: Adductor canal block   Pre-Anesthetic Checklist: , timeout performed,  Correct Patient, Correct Site, Correct Laterality,  Correct Procedure, Correct Position, site marked,  Risks and benefits discussed,  Surgical consent,  Pre-op evaluation,  At surgeon's request and post-op pain management  Laterality: Right  Prep: chloraprep       Needles:  Injection technique: Single-shot  Needle Type: Echogenic Needle     Needle Length: 9cm  Needle Gauge: 21     Additional Needles:   Procedures:,,,, ultrasound used (permanent image in chart),,    Narrative:  Start time: 06/11/2024 11:07 AM End time: 06/11/2024 11:12 AM Injection made incrementally with aspirations every 5 mL.  Performed by: Personally  Anesthesiologist: Peggy Bowens, MD

## 2024-06-11 NOTE — Op Note (Signed)
 OPERATIVE REPORT  SURGEON: Adonica Hoose, MD   ASSISTANT: Trixie Furnace, PA-C  PREOPERATIVE DIAGNOSIS: Primary Right knee arthritis.   POSTOPERATIVE DIAGNOSIS: Primary Right knee arthritis.   PROCEDURE: Computer assisted Right total knee arthroplasty.   IMPLANTS: Zimmer Persona PPS Cementless CR femur, size 10. Persona 0 degree Spiked Keel OsseoTi Tibia, size G. Vivacit-E polyethelyene insert, size 10 mm, CR. OsseoTi 3-Peg patella, size 38 mm.  ANESTHESIA:  MAC, Regional, and Spinal  TOURNIQUET TIME: Not utilized.   ESTIMATED BLOOD LOSS:-350 mL    ANTIBIOTICS: 2g Ancef .  DRAINS: None.  COMPLICATIONS: None   CONDITION: PACU - hemodynamically stable.   BRIEF CLINICAL NOTE: Robin [name] is a 53 y.o. female with a long-standing history of Right knee arthritis. After failing conservative management, the patient was indicated for total knee arthroplasty. The risks, benefits, and alternatives to the procedure were explained, and the patient elected to proceed.  PROCEDURE IN DETAIL: Adductor canal block was obtained in the pre-op holding area. Once inside the operative room, spinal anesthesia was obtained, and a foley catheter was inserted. The patient was then positioned and the lower extremity was prepped and draped in the normal sterile surgical fashion.  A time-out was called verifying side and site of surgery. The patient received IV antibiotics within 60 minutes of beginning the procedure. A tourniquet was not utilized.   An anterior approach to the knee was performed utilizing a midvastus arthrotomy. A medial release was performed and the patellar fat pad was excised. Stryker imageless navigation was used to cut the distal femur perpendicular to the mechanical axis. A freehand patellar resection was performed, and the patella was sized an prepared with 3 lug holes.  Nagivation was used to make a neutral proximal tibia resection, taking 9 mm of bone from the less affected  lateral side with 3 degrees of slope. The menisci were excised. A spacer block was placed, and the alignment and balance in extension were confirmed.   The distal femur was sized using the 3-degree external rotation guide referencing the posterior femoral cortex. The appropriate 4-in-1 cutting block was pinned into place. Rotation was checked using Whiteside's line, the epicondylar axis, and then confirmed with a spacer block in flexion. The remaining femoral cuts were performed, taking care to protect the MCL.  The tibia was sized and the trial tray was pinned into place. The remaining trail components were inserted. The knee was stable to varus and valgus stress through a full range of motion. The patella tracked centrally, and the PCL was well balanced. The trial components were removed, and the proximal tibial surface was prepared. Final components were impacted into place. The knee was tested for a final time and found to be well balanced.   The wound was copiously irrigated with Prontosan solution and normal saline using pulse lavage.  Marcaine  solution was injected into the periarticular soft tissue.  The wound was closed in layers using #1 Vicryl and Stratafix for the fascia, 2-0 Vicryl for the subcutaneous fat, 2-0 Monocryl for the deep dermal layer, and skin staples. Dermabond was applied to the skin.  Once the glue was fully dried, an Aquacell Ag and compressive dressing were applied.  The patient was transported to the recovery room in stable condition.  Sponge, needle, and instrument counts were correct at the end of the case x2.  The patient tolerated the procedure well and there were no known complications.  Please note that a surgical assistant was a medical necessity for this  procedure in order to perform it in a safe and expeditious manner. Surgical assistant was necessary to retract the ligaments and vital neurovascular structures to prevent injury to them and also necessary for proper  positioning of the limb to allow for anatomic placement of the prosthesis.

## 2024-06-11 NOTE — Plan of Care (Signed)

## 2024-06-11 NOTE — Anesthesia Preprocedure Evaluation (Addendum)
 Anesthesia Evaluation  Patient identified by MRN, date of birth, ID band Patient awake    Reviewed: Allergy & Precautions, NPO status , Patient's Chart, lab work & pertinent test results  Airway Mallampati: II  TM Distance: >3 FB Neck ROM: Full    Dental  (+) Dental Advisory Given   Pulmonary asthma , sleep apnea and Continuous Positive Airway Pressure Ventilation , former smoker   breath sounds clear to auscultation       Cardiovascular hypertension, Pt. on medications  Rhythm:Regular Rate:Normal     Neuro/Psych  PSYCHIATRIC DISORDERS       Neuromuscular disease    GI/Hepatic Neg liver ROS,GERD  ,,  Endo/Other  negative endocrine ROS    Renal/GU negative Renal ROS     Musculoskeletal  (+) Arthritis ,    Abdominal   Peds  Hematology negative hematology ROS (+)   Anesthesia Other Findings   Reproductive/Obstetrics                             Lab Results  Component Value Date   WBC 11.4 (H) 06/02/2024   HGB 13.6 06/02/2024   HCT 43.3 06/02/2024   MCV 89.1 06/02/2024   PLT 294 06/02/2024   Lab Results  Component Value Date   NA 138 06/02/2024   CL 103 06/02/2024   K 3.9 06/02/2024   CO2 26 06/02/2024   BUN 24 (H) 06/02/2024   CREATININE 0.76 06/02/2024   GFRNONAA >60 06/02/2024   CALCIUM  9.4 06/02/2024   ALBUMIN  3.5 04/19/2022   GLUCOSE 96 06/02/2024    Anesthesia Physical Anesthesia Plan  ASA: 2  Anesthesia Plan: Spinal   Post-op Pain Management: Regional block* and Tylenol  PO (pre-op)*   Induction:   PONV Risk Score and Plan: 2 and Dexamethasone , Propofol  infusion, Ondansetron  and Treatment may vary due to age or medical condition  Airway Management Planned: Natural Airway and Simple Face Mask  Additional Equipment:   Intra-op Plan:   Post-operative Plan:   Informed Consent: I have reviewed the patients History and Physical, chart, labs and discussed the  procedure including the risks, benefits and alternatives for the proposed anesthesia with the patient or authorized representative who has indicated his/her understanding and acceptance.       Plan Discussed with: CRNA  Anesthesia Plan Comments:         Anesthesia Quick Evaluation

## 2024-06-12 ENCOUNTER — Other Ambulatory Visit (HOSPITAL_COMMUNITY): Payer: Self-pay

## 2024-06-12 ENCOUNTER — Encounter (HOSPITAL_COMMUNITY): Payer: Self-pay | Admitting: Orthopedic Surgery

## 2024-06-12 ENCOUNTER — Other Ambulatory Visit: Payer: Self-pay

## 2024-06-12 DIAGNOSIS — M1711 Unilateral primary osteoarthritis, right knee: Secondary | ICD-10-CM | POA: Diagnosis not present

## 2024-06-12 LAB — BASIC METABOLIC PANEL WITH GFR
Anion gap: 9 (ref 5–15)
BUN: 12 mg/dL (ref 6–20)
CO2: 24 mmol/L (ref 22–32)
Calcium: 8 mg/dL — ABNORMAL LOW (ref 8.9–10.3)
Chloride: 101 mmol/L (ref 98–111)
Creatinine, Ser: 0.72 mg/dL (ref 0.44–1.00)
GFR, Estimated: >60 mL/min (ref >60)
Glucose, Bld: 118 mg/dL — ABNORMAL HIGH (ref 70–99)
Potassium: 3.3 mmol/L — ABNORMAL LOW (ref 3.5–5.1)
Sodium: 134 mmol/L — ABNORMAL LOW (ref 135–145)

## 2024-06-12 LAB — CBC
HCT: 35.5 % — ABNORMAL LOW (ref 36.0–46.0)
Hemoglobin: 11.3 g/dL — ABNORMAL LOW (ref 12.0–15.0)
MCH: 28.4 pg (ref 26.0–34.0)
MCHC: 31.8 g/dL (ref 30.0–36.0)
MCV: 89.2 fL (ref 80.0–100.0)
Platelets: 239 10*3/uL (ref 150–400)
RBC: 3.98 MIL/uL (ref 3.87–5.11)
RDW: 13 % (ref 11.5–15.5)
WBC: 13.7 10*3/uL — ABNORMAL HIGH (ref 4.0–10.5)
nRBC: 0 % (ref 0.0–0.2)

## 2024-06-12 MED ORDER — ONDANSETRON HCL 4 MG PO TABS
4.0000 mg | ORAL_TABLET | Freq: Three times a day (TID) | ORAL | 0 refills | Status: DC | PRN
  Filled 2024-06-12 (×2): qty 30, 10d supply, fill #0

## 2024-06-12 MED ORDER — DOCUSATE SODIUM 100 MG PO CAPS
100.0000 mg | ORAL_CAPSULE | Freq: Two times a day (BID) | ORAL | 0 refills | Status: AC
  Filled 2024-06-12 (×2): qty 60, 30d supply, fill #0

## 2024-06-12 MED ORDER — SENNA 8.6 MG PO TABS
2.0000 | ORAL_TABLET | Freq: Every day | ORAL | 0 refills | Status: AC
  Filled 2024-06-12 (×2): qty 30, 15d supply, fill #0

## 2024-06-12 MED ORDER — OXYCODONE HCL 5 MG PO TABS
5.0000 mg | ORAL_TABLET | ORAL | 0 refills | Status: DC | PRN
  Filled 2024-06-12 (×2): qty 56, 5d supply, fill #0

## 2024-06-12 MED ORDER — METHOCARBAMOL 500 MG PO TABS
500.0000 mg | ORAL_TABLET | Freq: Four times a day (QID) | ORAL | 0 refills | Status: DC | PRN
  Filled 2024-06-12 (×2): qty 20, 5d supply, fill #0

## 2024-06-12 MED ORDER — POTASSIUM CHLORIDE CRYS ER 20 MEQ PO TBCR
40.0000 meq | EXTENDED_RELEASE_TABLET | Freq: Once | ORAL | Status: AC
  Administered 2024-06-12: 40 meq via ORAL
  Filled 2024-06-12: qty 2

## 2024-06-12 MED ORDER — POLYETHYLENE GLYCOL 3350 17 GM/SCOOP PO POWD
17.0000 g | Freq: Every day | ORAL | 0 refills | Status: AC | PRN
  Filled 2024-06-12: qty 14, 14d supply, fill #0
  Filled 2024-06-12: qty 238, 14d supply, fill #0

## 2024-06-12 MED ORDER — ASPIRIN 81 MG PO CHEW
81.0000 mg | CHEWABLE_TABLET | Freq: Two times a day (BID) | ORAL | 0 refills | Status: AC
  Filled 2024-06-12 (×2): qty 90, 45d supply, fill #0

## 2024-06-12 NOTE — Discharge Summary (Cosign Needed)
 Physician Discharge Summary  Patient ID: Robin [name] MRN: [id number] DOB/AGE: 01-13-1971 53 y.o.  Admit date: 06/11/2024 Discharge date: 06/12/2024  Admission Diagnoses:  Osteoarthritis of right knee  Discharge Diagnoses:  Principal Problem:   Osteoarthritis of right knee Active Problems:   S/P total knee arthroplasty, right   Past Medical History:  Diagnosis Date   Allergy    Anxiety    Asthma    Bilateral carpal tunnel syndrome    Degeneration of lumbar intervertebral disc    Depression    GERD (gastroesophageal reflux disease)    Hyperlipidemia    Hypertension    OSA (obstructive sleep apnea)    has cpap   Osteoarthritis of both knees    Pneumonia    Rheumatoid arthritis (HCC)    Sarcoidosis    Spinal stenosis    Vitamin D deficiency     Surgeries: Procedure(s): ARTHROPLASTY, KNEE, TOTAL, USING IMAGELESS COMPUTER-ASSISTED NAVIGATION on 06/11/2024   Consultants (if any):   Discharged Condition: Improved  Hospital Course: Robin [name] is an 53 y.o. female who was admitted 06/11/2024 with a diagnosis of Osteoarthritis of right knee and went to the operating room on 06/11/2024 and underwent the above named procedures.    She was given perioperative antibiotics:  Anti-infectives (From admission, onward)    Start     Dose/Rate Route Frequency Ordered Stop   06/11/24 1900  ceFAZolin  (ANCEF ) IVPB 2g/100 mL premix        2 g 200 mL/hr over 30 Minutes Intravenous Every 6 hours 06/11/24 1717 06/12/24 0039   06/11/24 0900  ceFAZolin  (ANCEF ) IVPB 3g/150 mL premix        3 g 300 mL/hr over 30 Minutes Intravenous On call to O.R. 06/11/24 0855 06/11/24 1255       She was given sequential compression devices, early ambulation, and aspirin  for DVT prophylaxis.  POD#1. Patient doing well. Slight hypokalemia, given kdurr. Hemoglobin 11.3 asymptomatic, ABLA. She ambulated well with PT 100 and 95 feet. D/c home with OPPT.  She benefited maximally from the  hospital stay and there were no complications.    Recent vital signs:  Vitals:   06/12/24 0830 06/12/24 0918  BP: 123/71 121/71  Pulse: 77 75  Resp:  17  Temp:  98.4 F (36.9 C)  SpO2:  97%    Recent laboratory studies:  Lab Results  Component Value Date   HGB 11.3 (L) 06/12/2024   HGB 13.6 06/02/2024   HGB 13.2 04/28/2024   Lab Results  Component Value Date   WBC 13.7 (H) 06/12/2024   PLT 239 06/12/2024   Lab Results  Component Value Date   INR 1.2 06/15/2021   Lab Results  Component Value Date   NA 134 (L) 06/12/2024   K 3.3 (L) 06/12/2024   CL 101 06/12/2024   CO2 24 06/12/2024   BUN 12 06/12/2024   CREATININE 0.72 06/12/2024   GLUCOSE 118 (H) 06/12/2024     Allergies as of 06/12/2024       Reactions   Lisinopril Cough        Medication List     STOP taking these medications    cyclobenzaprine  10 MG tablet Commonly known as: FLEXERIL    HYDROcodone -acetaminophen  10-325 MG tablet Commonly known as: NORCO   metroNIDAZOLE  500 MG tablet Commonly known as: FLAGYL    Orencia  ClickJect 125 MG/ML Soaj Generic drug: Abatacept        TAKE these medications    acetaminophen  650 MG CR tablet Commonly  known as: TYLENOL  Take 1,300 mg by mouth every 8 (eight) hours as needed (pain).   albuterol  108 (90 Base) MCG/ACT inhaler Commonly known as: VENTOLIN  HFA Inhale 2 puffs into the lungs every 6 (six) hours as needed for wheezing or shortness of breath.   amLODipine  10 MG tablet Commonly known as: NORVASC  Take 1 tablet (10 mg total) by mouth daily.   aspirin  81 MG chewable tablet Commonly known as: Aspirin  Childrens Chew 1 tablet (81 mg total) by mouth 2 (two) times daily with a meal.   atorvastatin  20 MG tablet Commonly known as: LIPITOR Take 1 tablet (20 mg total) by mouth daily.   BIOTIN PO Take 1 tablet by mouth daily.   buPROPion  200 MG 12 hr tablet Commonly known as: WELLBUTRIN  SR Take 100-200 mg by mouth See admin instructions. Take  200 mg by mouth in the morning and take 100 mg in the evening   CALCIUM  PO Take 1 tablet by mouth daily.   carvedilol  12.5 MG tablet Commonly known as: COREG  Take 1 tablet (12.5 mg total) by mouth 2 (two) times daily with a meal.   cetirizine 10 MG tablet Commonly known as: ZYRTEC Take 10 mg by mouth daily as needed for allergies.   diclofenac  Sodium 1 % Gel Commonly known as: VOLTAREN  Apply 2 g topically daily as needed (pain).   docusate sodium 100 MG capsule Commonly known as: Colace Take 1 capsule (100 mg total) by mouth 2 (two) times daily.   escitalopram  20 MG tablet Commonly known as: LEXAPRO  Take 20 mg by mouth daily.   fluticasone 50 MCG/ACT nasal spray Commonly known as: FLONASE Place 1 spray into both nostrils daily as needed for allergies.   GaviLAX 17 GM/SCOOP powder Generic drug: polyethylene glycol powder Take 119 g by mouth daily as needed for moderate constipation. What changed: Another medication with the same name was added. Make sure you understand how and when to take each.   polyethylene glycol 17 g packet Commonly known as: MiraLax  Take 17 g by mouth daily as needed for mild constipation or moderate constipation. What changed: You were already taking a medication with the same name, and this prescription was added. Make sure you understand how and when to take each.   hydrALAZINE  25 MG tablet Commonly known as: APRESOLINE  Take 25 mg by mouth daily.   losartan -hydrochlorothiazide  100-25 MG tablet Commonly known as: HYZAAR Take 1 tablet by mouth daily.   Melatonin 10 MG Caps Take 20 mg by mouth at bedtime as needed (sleep).   methocarbamol  500 MG tablet Commonly known as: ROBAXIN  Take 1 tablet (500 mg total) by mouth every 6 (six) hours as needed for muscle spasms.   MULTIVITAMIN ADULT PO Take 1 tablet by mouth daily.   omeprazole 40 MG capsule Commonly known as: PRILOSEC Take 40 mg by mouth daily.   ondansetron  4 MG tablet Commonly  known as: Zofran  Take 1 tablet (4 mg total) by mouth every 8 (eight) hours as needed for nausea or vomiting.   OVER THE COUNTER MEDICATION Take 1 tablet by mouth daily. Menopause Supplement   oxyCODONE  5 MG immediate release tablet Commonly known as: Roxicodone  Take 1-2 tablets (5-10 mg total) by mouth every 4 (four) hours as needed for severe pain (pain score 7-10).   pregabalin  75 MG capsule Commonly known as: LYRICA  Take 2 capsules (150 mg total) by mouth 2 (two) times daily.   senna 8.6 MG Tabs tablet Commonly known as: SENOKOT Take 2 tablets (  17.2 mg total) by mouth at bedtime for 15 days.   triamcinolone  lotion 0.1 % Commonly known as: KENALOG  Apply 1 Application topically 3 (three) times daily.               Discharge Care Instructions  (From admission, onward)           Start     Ordered   06/12/24 0000  Weight bearing as tolerated        06/12/24 1013   06/12/24 0000  Change dressing       Comments: Do not remove your dressing.   06/12/24 1013              WEIGHT BEARING   Weight bearing as tolerated with assist device (walker, cane, etc) as directed, use it as long as suggested by your surgeon or therapist, typically at least 4-6 weeks.   EXERCISES  Results after joint replacement surgery are often greatly improved when you follow the exercise, range of motion and muscle strengthening exercises prescribed by your doctor. Safety measures are also important to protect the joint from further injury. Any time any of these exercises cause you to have increased pain or swelling, decrease what you are doing until you are comfortable again and then slowly increase them. If you have problems or questions, call your caregiver or physical therapist for advice.   Rehabilitation is important following a joint replacement. After just a few days of immobilization, the muscles of the leg can become weakened and shrink (atrophy).  These exercises are designed to  build up the tone and strength of the thigh and leg muscles and to improve motion. Often times heat used for twenty to thirty minutes before working out will loosen up your tissues and help with improving the range of motion but do not use heat for the first two weeks following surgery (sometimes heat can increase post-operative swelling).   These exercises can be done on a training (exercise) mat, on the floor, on a table or on a bed. Use whatever works the best and is most comfortable for you.    Use music or television while you are exercising so that the exercises are a pleasant break in your day. This will make your life better with the exercises acting as a break in your routine that you can look forward to.   Perform all exercises about fifteen times, three times per day or as directed.  You should exercise both the operative leg and the other leg as well.  Exercises include:   Quad Sets - Tighten up the muscle on the front of the thigh (Quad) and hold for 5-10 seconds.   Straight Leg Raises - With your knee straight (if you were given a brace, keep it on), lift the leg to 60 degrees, hold for 3 seconds, and slowly lower the leg.  Perform this exercise against resistance later as your leg gets stronger.  Leg Slides: Lying on your back, slowly slide your foot toward your buttocks, bending your knee up off the floor (only go as far as is comfortable). Then slowly slide your foot back down until your leg is flat on the floor again.  Angel Wings: Lying on your back spread your legs to the side as far apart as you can without causing discomfort.  Hamstring Strength:  Lying on your back, push your heel against the floor with your leg straight by tightening up the muscles of your buttocks.  Repeat, but this  time bend your knee to a comfortable angle, and push your heel against the floor.  You may put a pillow under the heel to make it more comfortable if necessary.   A rehabilitation program following  joint replacement surgery can speed recovery and prevent re-injury in the future due to weakened muscles. Contact your doctor or a physical therapist for more information on knee rehabilitation.    CONSTIPATION  Constipation is defined medically as fewer than three stools per week and severe constipation as less than one stool per week.  Even if you have a regular bowel pattern at home, your normal regimen is likely to be disrupted due to multiple reasons following surgery.  Combination of anesthesia, postoperative narcotics, change in appetite and fluid intake all can affect your bowels.   YOU MUST use at least one of the following options; they are listed in order of increasing strength to get the job done.  They are all available over the counter, and you may need to use some, POSSIBLY even all of these options:    Drink plenty of fluids (prune juice may be helpful) and high fiber foods Colace 100 mg by mouth twice a day  Senokot for constipation as directed and as needed Dulcolax (bisacodyl), take with full glass of water  Miralax  (polyethylene glycol) once or twice a day as needed.  If you have tried all these things and are unable to have a bowel movement in the first 3-4 days after surgery call either your surgeon or your primary doctor.    If you experience loose stools or diarrhea, hold the medications until you stool forms back up.  If your symptoms do not get better within 1 week or if they get worse, check with your doctor.  If you experience the worst abdominal pain ever or develop nausea or vomiting, please contact the office immediately for further recommendations for treatment.   ITCHING:  If you experience itching with your medications, try taking only a single pain pill, or even half a pain pill at a time.  You can also use Benadryl over the counter for itching or also to help with sleep.   TED HOSE STOCKINGS:  Use stockings on both legs until for at least 2 weeks or as  directed by physician office. They may be removed at night for sleeping.  MEDICATIONS:  See your medication summary on the "After Visit Summary" that nursing will review with you.  You may have some home medications which will be placed on hold until you complete the course of blood thinner medication.  It is important for you to complete the blood thinner medication as prescribed.  PRECAUTIONS:  If you experience chest pain or shortness of breath - call 911 immediately for transfer to the hospital emergency department.   If you develop a fever greater that 101 F, purulent drainage from wound, increased redness or drainage from wound, foul odor from the wound/dressing, or calf pain - CONTACT YOUR SURGEON.                                                   FOLLOW-UP APPOINTMENTS:  If you do not already have a post-op appointment, please call the office for an appointment to be seen by your surgeon.  Guidelines for how soon to be seen are listed in your "  After Visit Summary", but are typically between 1-4 weeks after surgery.  OTHER INSTRUCTIONS:   Knee Replacement:  Do not place pillow under knee, focus on keeping the knee straight while resting. CPM instructions: 0-90 degrees, 2 hours in the morning, 2 hours in the afternoon, and 2 hours in the evening. Place foam block, curve side up under heel at all times except when in CPM or when walking.  DO NOT modify, tear, cut, or change the foam block in any way.   MAKE SURE YOU:  Understand these instructions.  Get help right away if you are not doing well or get worse.    Thank you for letting us  be a part of your medical care team.  It is a privilege we respect greatly.  We hope these instructions will help you stay on track for a fast and full recovery!   Diagnostic Studies: DG Knee Right Port Result Date: 06/11/2024 CLINICAL DATA:  Status post knee arthroplasty EXAM: PORTABLE RIGHT KNEE - 1-2 VIEW COMPARISON:  01/09/2023 FINDINGS: Status post  right knee arthroplasty with intact hardware and normal alignment. Gas within the soft tissues and joint space consistent with recent surgery. IMPRESSION: Status post right knee arthroplasty with expected postsurgical change. Electronically Signed   By: Esmeralda Hedge M.D.   On: 06/11/2024 17:02    Disposition: Discharge disposition: 01-Home or Self Care       Discharge Instructions     Call MD / Call 911   Complete by: As directed    If you experience chest pain or shortness of breath, CALL 911 and be transported to the hospital emergency room.  If you develope a fever above 101 F, pus (white drainage) or increased drainage or redness at the wound, or calf pain, call your surgeon's office.   Change dressing   Complete by: As directed    Do not remove your dressing.   Constipation Prevention   Complete by: As directed    Drink plenty of fluids.  Prune juice may be helpful.  You may use a stool softener, such as Colace (over the counter) 100 mg twice a day.  Use MiraLax  (over the counter) for constipation as needed.   Diet - low sodium heart healthy   Complete by: As directed    Discharge instructions   Complete by: As directed    Elevate toes above nose. Use cryotherapy as needed for pain and swelling.   Do not put a pillow under the knee. Place it under the heel.   Complete by: As directed    Driving restrictions   Complete by: As directed    No driving for 6 weeks   Increase activity slowly as tolerated   Complete by: As directed    Lifting restrictions   Complete by: As directed    No lifting for 6 weeks   Post-operative opioid taper instructions:   Complete by: As directed    POST-OPERATIVE OPIOID TAPER INSTRUCTIONS: It is important to wean off of your opioid medication as soon as possible. If you do not need pain medication after your surgery it is ok to stop day one. Opioids include: Codeine, Hydrocodone (Norco, Vicodin), Oxycodone (Percocet, oxycontin ) and hydromorphone   amongst others.  Long term and even short term use of opiods can cause: Increased pain response Dependence Constipation Depression Respiratory depression And more.  Withdrawal symptoms can include Flu like symptoms Nausea, vomiting And more Techniques to manage these symptoms Hydrate well Eat regular healthy meals Stay active Use relaxation  techniques(deep breathing, meditating, yoga) Do Not substitute Alcohol to help with tapering If you have been on opioids for less than two weeks and do not have pain than it is ok to stop all together.  Plan to wean off of opioids This plan should start within one week post op of your joint replacement. Maintain the same interval or time between taking each dose and first decrease the dose.  Cut the total daily intake of opioids by one tablet each day Next start to increase the time between doses. The last dose that should be eliminated is the evening dose.      TED hose   Complete by: As directed    Use stockings (TED hose) for 2 weeks on both leg(s).  You may remove them at night for sleeping.   Weight bearing as tolerated   Complete by: As directed         Follow-up Information     Harman Lightning, PA-C. Schedule an appointment as soon as possible for a visit in 2 week(s).   Specialty: Orthopedic Surgery Why: For suture removal, For wound re-check Contact information: [address], [address] [place] [address] [phone]                  Signed: Harman Lightning 06/12/2024, 10:14 AM

## 2024-06-12 NOTE — Evaluation (Signed)
 Physical Therapy Evaluation Patient Details Name: Robin [name] MRN: [id number] DOB: 04-12-1971 Today's Date: 06/12/2024  History of Present Illness  Pt s/p R TKR and with hx of RA, spinal stenosis, back surgery, and sarcoidosis  Clinical Impression  Pt s/p R TKR and presents with decreased R LE strength/ROM and post op pain limiting functional mobility.  Pt should progress to dc home with family assist and reports first OP PT scheduled for 06/16/24        If plan is discharge home, recommend the following: A little help with walking and/or transfers;A little help with bathing/dressing/bathroom;Assistance with cooking/housework;Help with stairs or ramp for entrance;Assist for transportation   Can travel by private vehicle        Equipment Recommendations None recommended by PT  Recommendations for Other Services       Functional Status Assessment Patient has had a recent decline in their functional status and demonstrates the ability to make significant improvements in function in a reasonable and predictable amount of time.     Precautions / Restrictions Precautions Precautions: Knee;Fall Restrictions Weight Bearing Restrictions Per Provider Order: No RLE Weight Bearing Per Provider Order: Weight bearing as tolerated      Mobility  Bed Mobility Overal bed mobility: Needs Assistance Bed Mobility: Supine to Sit     Supine to sit: Contact guard     General bed mobility comments: for safety; use of bed rail    Transfers Overall transfer level: Needs assistance Equipment used: Rolling walker (2 wheels) Transfers: Sit to/from Stand Sit to Stand: Contact guard assist           General transfer comment: Steady assist with cues for LE management and use of UEs to self assist    Ambulation/Gait Ambulation/Gait assistance: Min assist, Contact guard assist Gait Distance (Feet): 95 Feet Assistive device: Rolling walker (2 wheels) Gait Pattern/deviations:  Step-to pattern, Decreased step length - right, Decreased step length - left, Shuffle, Trunk flexed Gait velocity: decr     General Gait Details: cues for sequence, posture and position from [employer]            Wheelchair Mobility     Tilt Bed    Modified Rankin (Stroke Patients Only)       Balance Overall balance assessment: Needs assistance Sitting-balance support: No upper extremity supported, Feet supported Sitting balance-Leahy Scale: Good     Standing balance support: Single extremity supported Standing balance-Leahy Scale: Poor                               Pertinent Vitals/Pain Pain Assessment Pain Assessment: 0-10 Pain Score: 5  Pain Location: R knee Pain Descriptors / Indicators: Aching, Sore Pain Intervention(s): Limited activity within patient's tolerance, Monitored during session, Premedicated before session, Ice applied    Home Living Family/patient expects to be discharged to:: Private residence Living Arrangements: Spouse/significant other Available Help at Discharge: Family;Available 24 hours/day Type of Home: House Home Access: Stairs to enter Entrance Stairs-Rails: None Entrance Stairs-Number of Steps: 1+1   Home Layout: One level Home Equipment: Agricultural consultant (2 wheels);Cane - single point      Prior Function Prior Level of Function : Independent/Modified Independent                     Extremity/Trunk Assessment   Upper Extremity Assessment Upper Extremity Assessment: Overall WFL for tasks assessed    Lower Extremity Assessment Lower  Extremity Assessment: RLE deficits/detail RLE Deficits / Details: AAROM at R knee 0 - 45 with IND SLR RLE: Unable to fully assess due to pain    Cervical / Trunk Assessment Cervical / Trunk Assessment: Normal  Communication   Communication Communication: No apparent difficulties    Cognition Arousal: Alert Behavior During Therapy: WFL for tasks assessed/performed    PT - Cognitive impairments: No apparent impairments                         Following commands: Intact       Cueing Cueing Techniques: Verbal cues     General Comments      Exercises Total Joint Exercises Ankle Circles/Pumps: AROM, Both, 15 reps, Supine Quad Sets: AROM, Both, 10 reps, Supine Heel Slides: AAROM, Right, 15 reps, Supine Straight Leg Raises: AAROM, AROM, Right, 15 reps, Supine   Assessment/Plan    PT Assessment Patient needs continued PT services  PT Problem List Decreased strength;Decreased range of motion;Decreased activity tolerance;Decreased balance;Decreased mobility;Decreased knowledge of use of DME;Pain       PT Treatment Interventions DME instruction;Gait training;Stair training;Functional mobility training;Therapeutic activities;Therapeutic exercise;Patient/family education    PT Goals (Current goals can be found in the Care Plan section)  Acute Rehab PT Goals Patient Stated Goal: Regain IND PT Goal Formulation: With patient Time For Goal Achievement: 06/19/24 Potential to Achieve Goals: Good    Frequency 7X/week     Co-evaluation               AM-PAC PT 6 Clicks Mobility  Outcome Measure Help needed turning from your back to your side while in a flat bed without using bedrails?: A Little Help needed moving from lying on your back to sitting on the side of a flat bed without using bedrails?: A Little Help needed moving to and from a bed to a chair (including a wheelchair)?: A Little Help needed standing up from a chair using your arms (e.g., wheelchair or bedside chair)?: A Little Help needed to walk in hospital room?: A Little Help needed climbing 3-5 steps with a railing? : A Little 6 Click Score: 18    End of Session Equipment Utilized During Treatment: Gait belt Activity Tolerance: Patient tolerated treatment well Patient left: in chair;with call bell/phone within reach;with chair alarm set Nurse Communication:  Mobility status PT Visit Diagnosis: Difficulty in walking, not elsewhere classified (R26.2)    Time: 3086-5784 PT Time Calculation (min) (ACUTE ONLY): 28 min   Charges:   PT Evaluation $PT Eval Low Complexity: 1 Low PT Treatments $Therapeutic Exercise: 8-22 mins PT General Charges $$ ACUTE PT VISIT: 1 Visit         Thedora Finlay PT Acute Rehabilitation Services Pager [phone] Office [phone]   Braxley Balandran 06/12/2024, 12:29 PM

## 2024-06-12 NOTE — Progress Notes (Signed)
 TOC meds in a secure bag delivered to pt in room by this RN.

## 2024-06-12 NOTE — Progress Notes (Signed)
 Physical Therapy Treatment Patient Details Name: Robin [name] MRN: [id number] DOB: [birth year] Today's Date: 06/12/2024   History of Present Illness Pt s/p R TKR and with hx of RA, spinal stenosis, back surgery, and sarcoidosis    PT Comments  Pt continues motivated and progressing well with mobility.  Pt up to ambulate in hall, negotiated stairs, and reviewed written HEP.  Pt eager for dc home this date.    If plan is discharge home, recommend the following: A little help with walking and/or transfers;A little help with bathing/dressing/bathroom;Assistance with cooking/housework;Help with stairs or ramp for entrance;Assist for transportation   Can travel by private vehicle        Equipment Recommendations  None recommended by PT    Recommendations for Other Services       Precautions / Restrictions Precautions Precautions: Knee;Fall Restrictions Weight Bearing Restrictions Per Provider Order: No RLE Weight Bearing Per Provider Order: Weight bearing as tolerated     Mobility  Bed Mobility Overal bed mobility: Needs Assistance Bed Mobility: Supine to Sit     Supine to sit: Contact guard     General bed mobility comments: Pt up in chair and requests back to same    Transfers Overall transfer level: Needs assistance Equipment used: Rolling walker (2 wheels) Transfers: Sit to/from Stand Sit to Stand: Contact guard assist, Supervision           General transfer comment: cues for LE management and use of UEs to self assist    Ambulation/Gait Ambulation/Gait assistance: Contact guard assist, Supervision Gait Distance (Feet): 100 Feet Assistive device: Rolling walker (2 wheels) Gait Pattern/deviations: Step-to pattern, Decreased step length - right, Decreased step length - left, Shuffle, Trunk flexed Gait velocity: decr     General Gait Details: min cues for sequence, posture and position from RW   Stairs Stairs: Yes Stairs assistance: Min  assist Stair Management: No rails, Step to pattern, Backwards, Forwards, With walker Number of Stairs: 5 General stair comments: single step x 5 - 3x fwd and 2x bkwd - cues for sequence and foot/RW placement   Wheelchair Mobility     Tilt Bed    Modified Rankin (Stroke Patients Only)       Balance Overall balance assessment: Needs assistance Sitting-balance support: No upper extremity supported, Feet supported Sitting balance-Leahy Scale: Good     Standing balance support: Single extremity supported Standing balance-Leahy Scale: Fair                              Hotel manager: No apparent difficulties  Cognition Arousal: Alert Behavior During Therapy: WFL for tasks assessed/performed   PT - Cognitive impairments: No apparent impairments                         Following commands: Intact      Cueing Cueing Techniques: Verbal cues  Exercises Total Joint Exercises Ankle Circles/Pumps: AROM, Both, 15 reps, Supine Quad Sets: AROM, Both, 10 reps, Supine Heel Slides: AAROM, Right, 15 reps, Supine Straight Leg Raises: AAROM, AROM, Right, 15 reps, Supine Long Arc Quad: AAROM, Right, Seated, 10 reps    General Comments        Pertinent Vitals/Pain Pain Assessment Pain Assessment: 0-10 Pain Score: 6  Pain Location: R knee Pain Descriptors / Indicators: Aching, Sore Pain Intervention(s): Limited activity within patient's tolerance, Monitored during session, Premedicated before session, Ice applied  Home Living Family/patient expects to be discharged to:: Private residence Living Arrangements: Spouse/significant other Available Help at Discharge: Family;Available 24 hours/day Type of Home: House Home Access: Stairs to enter Entrance Stairs-Rails: None Entrance Stairs-Number of Steps: 1+1   Home Layout: One level Home Equipment: Agricultural consultant (2 wheels);Cane - single point      Prior Function             PT Goals (current goals can now be found in the care plan section) Acute Rehab PT Goals Patient Stated Goal: Regain IND PT Goal Formulation: With patient Time For Goal Achievement: 06/19/24 Potential to Achieve Goals: Good Progress towards PT goals: Progressing toward goals    Frequency    7X/week      PT Plan      Co-evaluation              AM-PAC PT 6 Clicks Mobility   Outcome Measure  Help needed turning from your back to your side while in a flat bed without using bedrails?: A Little Help needed moving from lying on your back to sitting on the side of a flat bed without using bedrails?: A Little Help needed moving to and from a bed to a chair (including a wheelchair)?: A Little Help needed standing up from a chair using your arms (e.g., wheelchair or bedside chair)?: A Little Help needed to walk in hospital room?: A Little Help needed climbing 3-5 steps with a railing? : A Little 6 Click Score: 18    End of Session Equipment Utilized During Treatment: Gait belt Activity Tolerance: Patient tolerated treatment well Patient left: in chair;with call bell/phone within reach;with chair alarm set Nurse Communication: Mobility status PT Visit Diagnosis: Difficulty in walking, not elsewhere classified (R26.2)     Time: 1330-1402 PT Time Calculation (min) (ACUTE ONLY): 32 min  Charges:    $Gait Training: 8-22 mins $Therapeutic Exercise: 8-22 mins $Therapeutic Activity: 8-22 mins PT General Charges $$ ACUTE PT VISIT: 1 Visit                     Thedora Finlay PT Acute Rehabilitation Services Pager [phone] Office [phone]    Karinda Cabriales 06/12/2024, 3:31 PM

## 2024-06-12 NOTE — Progress Notes (Addendum)
    Subjective:  Patient reports pain as mild to moderate.  Denies N/V/CP/SOB/Abd pain. She denies any tingling or numbness in LE bilaterally. She reports her pain is doing well today.    Objective:   VITALS:   Vitals:   06/12/24 0220 06/12/24 0550 06/12/24 0830 06/12/24 0918  BP: 135/73 132/73 123/71 121/71  Pulse: 76 71 77 75  Resp: 16 16  17   Temp: 97.7 F (36.5 C) 98.4 F (36.9 C)  98.4 F (36.9 C)  TempSrc: Oral Oral    SpO2: 93% 99%  97%  Weight:      Height:        NAD Neurologically intact ABD soft Neurovascular intact Sensation intact distally Intact pulses distally Dorsiflexion/Plantar flexion intact Incision: dressing C/D/I No cellulitis present Compartment soft    Lab Results  Component Value Date   WBC 13.7 (H) 06/12/2024   HGB 11.3 (L) 06/12/2024   HCT 35.5 (L) 06/12/2024   MCV 89.2 06/12/2024   PLT 239 06/12/2024   BMET    Component Value Date/Time   NA 134 (L) 06/12/2024 0352   NA 143 04/04/2022 1426   K 3.3 (L) 06/12/2024 0352   CL 101 06/12/2024 0352   CO2 24 06/12/2024 0352   GLUCOSE 118 (H) 06/12/2024 0352   BUN 12 06/12/2024 0352   BUN 15 04/04/2022 1426   CREATININE 0.72 06/12/2024 0352   CREATININE 0.73 04/28/2024 1514   CALCIUM  8.0 (L) 06/12/2024 0352   EGFR 98 04/28/2024 1514   EGFR 66 04/04/2022 1426   GFRNONAA >60 06/12/2024 0352     Assessment/Plan: 1 Day Post-Op   Principal Problem:   Osteoarthritis of right knee Active Problems:   S/P total knee arthroplasty, right  ABLA. Hemoglobin 11.3. Asymptomatic. Continue to monitor.  Hypokalemia. K+ 3.3. 40 mEq kdurr given.   WBAT with walker DVT ppx: Aspirin , SCDs, TEDS PO pain control PT/OT: To come today.  Dispo:  - D/c home with OPPT once cleared with PT.    Harman Lightning 06/12/2024, 10:03 AM   [facility]  [place] [address], [address], [place], [place] [address] Phone: [phone] [email or web address] Facebook  [employer]

## 2024-06-12 NOTE — Anesthesia Postprocedure Evaluation (Signed)
 Anesthesia Post Note  Patient: Maryclare G Ozier  Procedure(s) Performed: ARTHROPLASTY, KNEE, TOTAL, USING IMAGELESS COMPUTER-ASSISTED NAVIGATION (Right: Knee)     Patient location during evaluation: PACU Anesthesia Type: General Level of consciousness: awake and alert Pain management: pain level controlled Vital Signs Assessment: post-procedure vital signs reviewed and stable Respiratory status: spontaneous breathing, nonlabored ventilation, respiratory function stable and patient connected to nasal cannula oxygen Cardiovascular status: blood pressure returned to baseline and stable Postop Assessment: no apparent nausea or vomiting Anesthetic complications: no   No notable events documented.  Last Vitals:  Vitals:   06/12/24 0918 06/12/24 1356  BP: 121/71 (!) 117/96  Pulse: 75 70  Resp: 17 17  Temp: 36.9 C 37.2 C  SpO2: 97% 97%    Last Pain:  Vitals:   06/12/24 1415  TempSrc:   PainSc: 0-No pain                 Melvenia Stabs

## 2024-06-12 NOTE — TOC Transition Note (Signed)
 Transition of Care [facility]) - Discharge Note   Patient Details  Name: Robin [name] MRN: [id number] Date of Birth: Dec 26, 1970  [facility] CM/SW Contact:  Bari Leys, RN Phone Number: 06/12/2024, 9:43 AM   Clinical Narrative:   Met with patient at bedside to review dc therapy and home equipment needs, pt confirmed OPPT at [facility], has RW. No  TOC needs.     Final next level of care: OP Rehab     Patient Goals and CMS Choice Patient states their goals for this hospitalization and ongoing recovery are:: return home          Discharge Placement                       Discharge Plan and Services Additional resources added to the After Visit Summary for                                       Social Drivers of Health (SDOH) Interventions SDOH Screenings   Food Insecurity: No Food Insecurity (06/11/2024)  Housing: Low Risk  (06/11/2024)  Transportation Needs: No Transportation Needs (06/11/2024)  Utilities: Not At Risk (06/11/2024)  Alcohol Screen: Low Risk  (04/23/2023)  Financial Resource Strain: High Risk (04/23/2023)  Physical Activity: Insufficiently Active (04/23/2023)  Social Connections: Moderately Isolated (06/11/2024)  Stress: Stress Concern Present (04/23/2023)  Tobacco Use: Medium Risk (06/11/2024)     Readmission Risk Interventions     No data to display

## 2024-06-13 ENCOUNTER — Other Ambulatory Visit: Payer: Self-pay

## 2024-06-13 ENCOUNTER — Telehealth: Payer: Self-pay | Admitting: Pharmacist

## 2024-06-13 NOTE — Telephone Encounter (Signed)
 Received message from specialty pharmacy that Orencia  SQ rx was discontinued at discharge. Patient had knee replacement surgery on 06/11/24. Please ensure she has clearance from surgeon or Dr. Rodell Citrin before resuming immunosuppressive treatment  Geraldene Kleine, PharmD, MPH, BCPS, CPP Clinical Pharmacist (Rheumatology and Pulmonology)

## 2024-06-13 NOTE — Progress Notes (Signed)
 Patient just had knee replacement surgery and Orencia  Rx was discontinued. Cancelled fill in [facility] Per [facility] [place] patient should not restart for at least 6 weeks. Retiming next refill outeach to call patient and check in at that time.

## 2024-06-16 NOTE — Telephone Encounter (Signed)
 Attempted to contact the patient and left a message to call the office back.

## 2024-06-17 NOTE — Telephone Encounter (Signed)
 Patient advised our pharmacy team Received message from specialty pharmacy that Orencia  SQ rx was discontinued at discharge. Patient had knee replacement surgery on 06/11/24. Please ensure she has clearance from surgeon or Dr. Jeannetta before resuming immunosuppressive treatment.

## 2024-07-04 ENCOUNTER — Other Ambulatory Visit: Payer: Self-pay | Admitting: Internal Medicine

## 2024-07-04 DIAGNOSIS — Z1231 Encounter for screening mammogram for malignant neoplasm of breast: Secondary | ICD-10-CM

## 2024-07-16 ENCOUNTER — Encounter: Payer: Self-pay | Admitting: Physician Assistant

## 2024-07-17 ENCOUNTER — Other Ambulatory Visit: Payer: Self-pay

## 2024-07-17 ENCOUNTER — Other Ambulatory Visit (HOSPITAL_COMMUNITY): Payer: Self-pay

## 2024-07-17 ENCOUNTER — Other Ambulatory Visit: Payer: Self-pay | Admitting: Internal Medicine

## 2024-07-17 DIAGNOSIS — Z79899 Other long term (current) drug therapy: Secondary | ICD-10-CM

## 2024-07-17 DIAGNOSIS — M0609 Rheumatoid arthritis without rheumatoid factor, multiple sites: Secondary | ICD-10-CM

## 2024-07-17 MED ORDER — ORENCIA CLICKJECT 125 MG/ML ~~LOC~~ SOAJ
125.0000 mg | SUBCUTANEOUS | 0 refills | Status: AC
  Filled 2024-07-17: qty 12, 84d supply, fill #0
  Filled 2024-08-06: qty 4, 28d supply, fill #0
  Filled 2024-08-27: qty 4, 28d supply, fill #1
  Filled 2024-09-22: qty 4, 28d supply, fill #2

## 2024-07-17 NOTE — Telephone Encounter (Signed)
 Last Fill: 05/02/2024  Labs: 07/03/2024 MCHC 30.7 Platelets 480 Lymphs 4.6 Alkaline Phosphatase 168  TB Gold: 04/28/2024 Negative   Next Visit: 07/29/2024  Last Visit: 04/28/2024  IK:Myzlfjunpi arthritis of multiple sites with negative rheumatoid factor (HCC)   Current Dose per office note 04/28/2024: Orencia  at 125 mg subcu weekly   Per phone note from 06/13/2024: Patient advised our pharmacy team Received message from specialty pharmacy that Orencia  SQ rx was discontinued at discharge. Patient had knee replacement surgery on 06/11/24. Please ensure she has clearance from surgeon or Dr. Jeannetta before resuming immunosuppressive treatment.   Okay to refill Orencia ?

## 2024-07-21 ENCOUNTER — Other Ambulatory Visit: Payer: Self-pay

## 2024-07-23 NOTE — Progress Notes (Signed)
 Office Visit Note  Patient: Robin [name]             Date of Birth: 1971-01-02           MRN: [id number]             PCP: Buck Search, PA-C Referring: Buck Search, PA-C Visit Date: 07/29/2024   Subjective:  Follow-up   Discussed the use of AI scribe software for clinical note transcription with the patient, who gave verbal consent to proceed.  History of Present Illness   Robin [name] is a 53 y.o. female here for follow up for seronegative RA and OA now on Orencia  125 mg subcu weekly and lyrica  75 mg BID.    She underwent right knee replacement surgery and her knee is doing well post-operatively. She completed physical therapy and is now managing independently. She is scheduled to see her doctor soon and hopes to resume her Orencia  medication, which was paused due to an open wound post-surgery.  Since discontinuing Orencia , she has been experiencing joint pain, muscle pain, and bone pain. She is eager to restart Orencia  to alleviate these symptoms.  She is currently taking Lyrica  at a dose of 75 mg twice a day. She initially increased the dose but experienced swelling in her foot, so she adjusted to one pill in the morning and one at night. The lower dose has been effective in managing her symptoms without causing significant side effects.  She reports ongoing issues with carpal tunnel syndrome, which affects both hands, particularly at night. Additionally, she has a cyst on one finger joint and a bony nodule on her thumb, which are painful when bumped.  No recent illnesses or antibiotic use since her last visit. Her recent blood work was completed in July.       Previous HPI 04/28/2024 Robin [name] is a 53 y.o. female here for follow up for seronegative RA and OA now on Orencia  125 mg subcu weekly and lyrica  75 mg BID.     She has experienced increased pain since discontinuing her medication in March due to a change in [place] from [place] to  [employer]. The pain has slightly increased since being off her medication for over a month.   She was previously on Orencia  and Humira  for rheumatoid arthritis. She is considering switching to an infusion treatment for Orencia  due to cost concerns with her current Medicare plan. She has not had any medication since March and is experiencing more pain as a result.   She is currently taking Lyrica  75 mg, which she finds very helpful, allowing her to 'get around, go places, and do things.' Lyrica  works better than gabapentin , which caused a 'funny feeling' in her head. She is interested in increasing the Lyrica  dose as she is tolerating it well without significant side effects, except for some swelling in one leg, which she attributes to previous surgery on her toes.   She also experiences significant pain from carpal tunnel syndrome, which she manages with wrist braces worn at night and sometimes during the day. She has had a painful but effective injection in the past for this condition. Her knees are also problematic, with bone spurs causing pain, and she was scheduled for knee replacement surgery before her insurance changed. She finds relief from water exercises, which are low impact and beneficial for her condition.   She takes cyclobenzaprine  at night as needed, which she finds helpful. She is working on making progress after  surgery last year and is cautious about using steroids. No new leg swelling on Lyrica , except for one leg which she attributes to past surgery. Significant pain in her shoulders and hands, particularly from carpal tunnel syndrome. No other side effects from her current medications.         Previous HPI 02/04/2024 Robin [name] is a 53 y.o. female here for follow up for seronegative RA now on Orencia  125 mg subcu weekly and OA..     She experiences widespread pain, particularly in her back, shoulders, elbows, and hands, with radiation up her arms and legs. The  pain is sharp, constant, and exacerbated by cold weather, causing her body to 'lock up' when stepping outside. She reports continuous and severe pain down to her bones and muscles.   She has bilateral carpal tunnel syndrome contributing to hand pain, with swelling described as 'like a mitten' and occasional knee swelling after walking. She experiences tingling and a 'falling asleep sensation' in her hands at night, despite wearing wrist braces. No specific treatments for carpal tunnel syndrome have been tried.   She has difficulty sleeping due to pain, despite using 'sleepy time tea,' and sleeps no more than four hours a night in short intervals. Various medications have been tried for pain, including gabapentin , which was discontinued due to side effects, and Lyrica , stopped due to insurance changes. She is currently taking Wellbutrin  for depression and anxiety.   She has been attempting to exercise at the [facility] but finds the pain overwhelming. Prednisone  provides some relief, though not sufficient. She previously used hydrocodone  for back pain but stopped due to withdrawal symptoms.   She is scheduled to see her orthopedic specialist for a knee replacement consultation, as her weight has decreased sufficiently for the procedure.     Previous HPI 11/01/2023 Robin [name] is a 53 y.o. female here for follow up for seronegative RA now on Orencia  125 mg subcu weekly.  She is currently holding her medication due to going for laparoscopic gastrectomy on October 22.  She is asking today whether she can resume her medication.  She did notice within 1 to 2 weeks of skipping Orencia  starting to have increased joint pain especially in her right hand.  Also reports increased right shoulder pain that is most severe at night and with walking using a cane.  Surgery was uneventful she has been progressing with her diet.  One of the portals took longer than others to heal but has now scabbed over since 2 or 3  days ago.  Still has more symptoms from bilateral knee pain with weightbearing limits her to walking for only a few minutes at a time and uses cane for offloading.   07/30/2023 Starlynn DALINDA HEIDT is a 53 y.o. female here for follow up for seronegative RA now on Orencia  125 mg subcu weekly.  She not had any major flareup with joint pain or swelling.  Is noticing some ongoing swelling worst around the base of the thumb on left and right hand occasionally involving the second finger as well.  She saw her [facility] provider for pain in both shoulders and across the upper back.  Had x-rays for this and had a left shoulder steroid injection with Dr. Kay last week.  So far this is beneficial discussed plan to try on the right side for this in a week or 2 if it is continuing to help.  She is walking with cane for assistance for bilateral knee pain this  sometimes causes her right elbow to hurt a little bit.   04/25/2023 Amar CAROLE DONER is a 53 y.o. female here for follow up for seronegative RA now on Orencia  125 mg subcu weekly.  She started with first dose of medication at pharmacy clinic appointment on March 6.  Has not experienced any difficulty with the medication or injection site reactions.  Labs checked from April 11 looks fine no problems with new medication start.  So far feels pretty good with this though she still has significant ongoing joint pain.  Worst complaint today in her shoulders and at the right elbow.   02/19/23 Adriana DARIA MCMEEKIN is a 53 y.o. female here for follow up for seronegative RA on Humira  40 mg subcu q. 14 days.  We just met last month discussed possibly switching to Actemra due to decreased Humira  efficacy.  However after reviewing side effect risks in more detail she is very concerned about this medication and was to look at other options.  Including possible side effects affecting her blood pressure or pancreatitis or liver function.   01/10/23 Mackenzie LYNCOLN MASKELL is a  53 y.o. female here for follow up for seronegative RA on Humira  40 mg [place] q14days. Since our last visit she has a lot of ongoing joint pain her worst affected joints are in both knees where she has known severe osteoarthritis.  She is planned for completing series of visco supplementation injections. This is ongoing as a temporizing measure she is not currently candidate for joint replacement surgery due to morbid obesity. She is seeing weight loss clinic for this.  Besides that she still having some ongoing joint pain and swelling in her upper extremities and increase in facial rashes.   12/05/22 Berdella CAITLYNNE HARBECK is a 53 y.o. female here for follow up for seronegative RA on Humira  40 mg  q14days. She had updated labs checked in family medicine clinic November with negative RA serology and normal inflammatory markers. Some improvement in swelling with Humira  but continues to have a lot of pain on a daily basis, worst in knees with known OA also in both hands, right elbow, left shoulder worse. She had injections to her knees with only a few days of benefit. On discussion of lab findings and notes reviewed, patient reports previously thought to have sarcoid with abnormal CXR and eye inflammation problems but was not on disease specific treatment at that time. She also had abnormal labs concerning for lupus for years when younger does not recall the exact details.     Labs reviewed 10/26/22 ANA pos RF neg CCP neg ESR wnl CRP wnl Vit D 1,25 wnl   Previous HPI 04/18/22 Saory ZYNIAH FERRAIOLO is a 53 y.o. female here for seronegative RA, previously a patient with Dr. Curt. She was taking Humira  previously interrupted due to loss of coverage and leflunomide with questionable response in symptoms. She was diagnosed after visit in 11/2019 with multiple areas of joint pain and frequent swelling particularly in bilateral wrists.  Laboratory work-up at that time including rheumatoid factor, CCP, ACE, HLA-B27,  sed rate, and CRP were all negative.  She was started on methotrexate and the leflunomide with lack of response in her symptoms.  She started on Humira  for this and took it for a few months but was then off the medication for at least about a year due to change in insurance status after losing her job.  She restarted this since 2 months ago so far has not  seen a very impressive change in her symptoms.  She is significantly limited by severe lower back pain with degenerative arthritis pursuing disability for this.  Also has advanced osteoarthritis of bilateral knees.  Shoulder pain limits overhead movement or worsened with pressure.  Gets chronic hand pain worse around the thumbs but especially on the radial side of her wrist with fairly chronic swelling currently worse than normal on the right side.  She has had local steroid joint injections in multiple areas but none in the past year these were typically beneficial for at least weeks in the past.  For her osteoarthritis pain she takes diclofenac  twice daily with a pretty good benefit has not experienced any side effects or problems taking this medicine.    DMARD Hx Orencia  - current Humira  - loss of response MTX and LEF - inadequate response   Review of Systems  Constitutional:  Positive for fatigue.  HENT:  Positive for mouth dryness. Negative for mouth sores.   Eyes:  Positive for dryness.  Respiratory:  Negative for shortness of breath.   Cardiovascular:  Negative for chest pain and palpitations.  Gastrointestinal:  Positive for constipation. Negative for blood in stool and diarrhea.  Endocrine: Negative for increased urination.  Genitourinary:  Negative for involuntary urination.  Musculoskeletal:  Positive for joint pain, joint pain, joint swelling, myalgias, muscle weakness, morning stiffness, muscle tenderness and myalgias. Negative for gait problem.  Skin:  Positive for color change, rash and sensitivity to sunlight. Negative for hair  loss.  Allergic/Immunologic: Negative for susceptible to infections.  Neurological:  Negative for dizziness and headaches.  Hematological:  Negative for swollen glands.  Psychiatric/Behavioral:  Positive for depressed mood and sleep disturbance. The patient is nervous/anxious.     PMFS History:  Patient Active Problem List   Diagnosis Date Noted   Osteoarthritis of right knee 06/11/2024   S/P total knee arthroplasty, right 06/11/2024   Myofascial pain 02/04/2024   Recurrent major depressive disorder, in partial remission (HCC) 11/01/2023   Status post gastric surgery 10/31/2023   Bilateral shoulder pain 04/25/2023   Dysphagia 04/17/2023   Gastroesophageal reflux disease without esophagitis 04/17/2023   Colon cancer screening 04/17/2023   Constipation 04/17/2023   Bilateral carpal tunnel syndrome 03/12/2023   Positive ANA (antinuclear antibody) 12/05/2022   Vitamin D deficiency 11/27/2022   High risk medication use 04/19/2022   Financial insecurity 02/13/2022   Anxiety and depression 01/30/2022   Hypercholesterolemia 01/30/2022   Primary hypertension 01/30/2022   Rheumatoid arthritis of multiple sites with negative rheumatoid factor (HCC) 01/30/2022   Osteoarthritis of left knee 06/01/2021   Lumbar spinal stenosis status post L2-L3 TLIF 05/05/2021   Degenerative scoliosis 04/18/2021   Sarcoidosis 05/10/2020   Pain of left hip joint 03/24/2020   Cervical spondylosis without myelopathy 07/16/2019   Neck pain 07/16/2019   Obesity, Class III, BMI 40-49.9 (morbid obesity) 10/18/2018   Primary osteoarthritis of both knees 10/18/2018   Hammertoe of right foot 07/02/2018   OSA (obstructive sleep apnea) 10/26/2014    Past Medical History:  Diagnosis Date   Allergy    Anxiety    Asthma    Bilateral carpal tunnel syndrome    Degeneration of lumbar intervertebral disc    Depression    GERD (gastroesophageal reflux disease)    Hyperlipidemia    Hypertension    OSA (obstructive  sleep apnea)    has cpap   Osteoarthritis of both knees    Pneumonia    Rheumatoid arthritis (HCC)  Sarcoidosis    Spinal stenosis    Vitamin D deficiency     Family History  Problem Relation Age of Onset   Colon polyps Mother    Colon cancer Neg Hx    Stomach cancer Neg Hx    Esophageal cancer Neg Hx    Rectal cancer Neg Hx    Past Surgical History:  Procedure Laterality Date   ABDOMINAL HYSTERECTOMY     DEBRIDEMENT AND CLOSURE WOUND N/A 06/15/2021   Procedure: Irrigation and debridement and closure of spine wound;  Surgeon: Burnetta Aures, MD;  Location: [facility] OR;  Service: Orthopedics;  Laterality: N/A;   ESOPHAGOGASTRODUODENOSCOPY     FOOT SURGERY Bilateral    Both surgeries for bunions by Dr. Teresita per pt   gastric sleeve surgery  10/16/2023   KNEE ARTHROPLASTY Right 06/11/2024   Procedure: ARTHROPLASTY, KNEE, TOTAL, USING IMAGELESS COMPUTER-ASSISTED NAVIGATION;  Surgeon: Fidel Rogue, MD;  Location: [facility] ORS;  Service: Orthopedics;  Laterality: Right;   TRANSFORAMINAL LUMBAR INTERBODY FUSION (TLIF) WITH PEDICLE SCREW FIXATION 1 LEVEL N/A 05/05/2021   Procedure: TRANSFORAMINAL LUMBAR INTERBODY FUSION (TLIF) WITH PEDICLE SCREW FIXATION 1 LEVEL L2-3;  Surgeon: Burnetta Aures, MD;  Location: [facility] OR;  Service: Orthopedics;  Laterality: N/A;  4 hrs   Social History   Social History Narrative   Not on file   Immunization History  Administered Date(s) Administered   Influenza,inj,quad, With Preservative 09/25/2019   Influenza-Unspecified 10/20/2017   MODERNA COVID-19 SARS-COV-2 PEDS BIVALENT BOOSTER 9yr-40yr 01/07/2020, 02/04/2020   Unspecified SARS-COV-2 Vaccination 01/07/2020, 02/04/2020     Objective: Vital Signs: BP 105/67 (BP Location: Left Arm, Patient Position: Sitting, Cuff Size: Large)   Pulse 66   Resp 16   Ht 5' 10 (1.778 m)   Wt 273 lb 6.4 oz (124 kg)   BMI 39.23 kg/m    Physical Exam Constitutional:      Appearance: She is obese.  Eyes:      Conjunctiva/sclera: Conjunctivae normal.  Cardiovascular:     Rate and Rhythm: Normal rate and regular rhythm.  Pulmonary:     Effort: Pulmonary effort is normal.     Breath sounds: Normal breath sounds.  Lymphadenopathy:     Cervical: No cervical adenopathy.  Skin:    General: Skin is warm and dry.  Neurological:     Mental Status: She is alert.  Psychiatric:        Mood and Affect: Mood normal.      Musculoskeletal Exam:  Shoulders full ROM no tenderness or swelling Elbows full ROM no tenderness or swelling Wrists full ROM, pain with flexion/extension but no radiating pain from percussion over carpal tunnel, right wrist ganglion cyst over FCR Fingers full ROM no tenderness or swelling Right knee postsurgical change, no palpable effusion or focal tenderness to pressure, left knee with patellofemoral crepitus  Investigation: No additional findings.  Imaging: No results found.  Recent Labs: Lab Results  Component Value Date   WBC 13.7 (H) 06/12/2024   HGB 11.3 (L) 06/12/2024   PLT 239 06/12/2024   NA 134 (L) 06/12/2024   K 3.3 (L) 06/12/2024   CL 101 06/12/2024   CO2 24 06/12/2024   GLUCOSE 118 (H) 06/12/2024   BUN 12 06/12/2024   CREATININE 0.72 06/12/2024   BILITOT 0.4 04/28/2024   ALKPHOS 96 04/19/2022   AST 20 04/28/2024   ALT 19 04/28/2024   PROT 6.6 04/28/2024   ALBUMIN  3.5 04/19/2022   CALCIUM  8.0 (L) 06/12/2024   GFRAA  10/12/2009    >  60        The eGFR has been calculated using the MDRD equation. This calculation has not been validated in all clinical situations. eGFR's persistently <60 mL/min signify possible Chronic Kidney Disease.   QFTBGOLDPLUS NEGATIVE 04/28/2024    Speciality Comments: No specialty comments available.  Procedures:  No procedures performed Allergies: Lisinopril   Assessment / Plan:     Visit Diagnoses: Rheumatoid arthritis of multiple sites with negative rheumatoid factor (HCC)  Rheumatoid arthritis with joint pain,  bilateral shoulder stiffness, right elbow popping, and finger joint cyst Increased joint pain and stiffness post-Orencia  discontinuation. Finger joint cyst likely ganglion due to swelling. Bony nodule from osteoarthritis in thumb causing pain. - Restart Orencia  125 mg [place] weekly after surgical wound closure is confirmed by the surgeon- f/u next week.  High risk medication use - Orencia  at 125 mg subcu weekly Recent labs reviewed including blood count metabolic panel were normal.  Is currently off treatment so would not repeat labs for monitoring at this time but previously was tolerating medication well.  No serious interval infections.  Surgical wound looks good to me apparently there was some delay in closure along the superior border of the incision but would certainly expect clearance for resuming all treatments coming up.  Bilateral carpal tunnel syndrome Nocturnal symptoms causing discomfort.  Status post right knee replacement Good recovery with nearly full range of motion. Awaiting surgeon's clearance to restart Orencia .  Myofascial pain - pregabalin  75 mg BID as tolerated max dose, Flexeril  10 mg at night as needed - Plan: pregabalin  (LYRICA ) 75 MG capsule Peripheral edema of lower extremity associated with pregabalin  use Swelling subsided after pregabalin  dose reduction. Current dose effective and safe. - Continue pregabalin  at 75 mg twice daily. - Send a 90-day refill prescription for pregabalin .        Orders: No orders of the defined types were placed in this encounter.  Meds ordered this encounter  Medications   pregabalin  (LYRICA ) 75 MG capsule    Sig: Take 1 capsule (75 mg total) by mouth 2 (two) times daily.    Dispense:  180 capsule    Refill:  0     Follow-Up Instructions: Return in about 3 months (around 10/29/2024) for RA on ABA/Lyr f/u 3mos.   Lonni LELON Ester, MD  Note - This record has been created using [employer].  Chart creation errors have been  sought, but may not always  have been located. Such creation errors do not reflect on  the standard of medical care.

## 2024-07-29 ENCOUNTER — Encounter: Payer: Self-pay | Admitting: Internal Medicine

## 2024-07-29 ENCOUNTER — Ambulatory Visit: Attending: Orthopedic Surgery | Admitting: Internal Medicine

## 2024-07-29 VITALS — BP 105/67 | HR 66 | Resp 16 | Ht 70.0 in | Wt 273.4 lb

## 2024-07-29 DIAGNOSIS — M7918 Myalgia, other site: Secondary | ICD-10-CM | POA: Insufficient documentation

## 2024-07-29 DIAGNOSIS — Z79899 Other long term (current) drug therapy: Secondary | ICD-10-CM | POA: Insufficient documentation

## 2024-07-29 DIAGNOSIS — M170 Bilateral primary osteoarthritis of knee: Secondary | ICD-10-CM | POA: Diagnosis not present

## 2024-07-29 DIAGNOSIS — G5603 Carpal tunnel syndrome, bilateral upper limbs: Secondary | ICD-10-CM | POA: Insufficient documentation

## 2024-07-29 DIAGNOSIS — M0609 Rheumatoid arthritis without rheumatoid factor, multiple sites: Secondary | ICD-10-CM | POA: Insufficient documentation

## 2024-07-29 MED ORDER — PREGABALIN 75 MG PO CAPS: 75.0000 mg | ORAL_CAPSULE | Freq: Two times a day (BID) | ORAL | 0 refills | Status: DC

## 2024-08-06 ENCOUNTER — Other Ambulatory Visit: Payer: Self-pay

## 2024-08-06 ENCOUNTER — Ambulatory Visit: Payer: Medicaid Other | Admitting: Nurse Practitioner

## 2024-08-06 NOTE — Progress Notes (Signed)
 Patient Satisfaction Survey Complete.

## 2024-08-06 NOTE — Progress Notes (Signed)
 Specialty Pharmacy Refill Coordination Note  Robin [name] is a 53 y.o. female contacted today regarding refills of specialty medication(s) Abatacept  (Orencia  [employer])   Patient requested Delivery   Delivery date: 08/07/24   Verified address: [address]   [place] [phone]   Medication will be filled on 08/06/24.

## 2024-08-21 ENCOUNTER — Telehealth: Payer: Self-pay | Admitting: *Deleted

## 2024-08-21 ENCOUNTER — Telehealth: Payer: Self-pay

## 2024-08-21 NOTE — Telephone Encounter (Signed)
 Appointment scheduled for 09/03/2024 @ 10:20am. Med req and consent are complete.

## 2024-08-21 NOTE — Telephone Encounter (Signed)
 Left message to call back to schedule tele pre op appt.

## 2024-08-21 NOTE — Telephone Encounter (Signed)
  Patient Consent for Virtual Visit         Robin [name] has provided verbal consent on 08/21/2024 for a virtual visit (video or telephone).  Appointment scheduled for 09/03/2024 at 10:20.  Med req and consent are complete.    CONSENT FOR VIRTUAL VISIT FOR:  Robin [name]  By participating in this virtual visit I agree to the following:  I hereby voluntarily request, consent and authorize [facility] and its employed or contracted physicians, physician assistants, nurse practitioners or other licensed health care professionals (the Practitioner), to provide me with telemedicine health care services (the "Services) as deemed necessary by the treating Practitioner. I acknowledge and consent to receive the Services by the Practitioner via telemedicine. I understand that the telemedicine visit will involve communicating with the Practitioner through live audiovisual communication technology and the disclosure of certain medical information by electronic transmission. I acknowledge that I have been given the opportunity to request an in-person assessment or other available alternative prior to the telemedicine visit and am voluntarily participating in the telemedicine visit.  I understand that I have the right to withhold or withdraw my consent to the use of telemedicine in the course of my care at any time, without affecting my right to future care or treatment, and that the Practitioner or I may terminate the telemedicine visit at any time. I understand that I have the right to inspect all information obtained and/or recorded in the course of the telemedicine visit and may receive copies of available information for a reasonable fee.  I understand that some of the potential risks of receiving the Services via telemedicine include:  Delay or interruption in medical evaluation due to technological equipment failure or disruption; Information transmitted may not be sufficient  (e.g. poor resolution of images) to allow for appropriate medical decision making by the Practitioner; and/or  In rare instances, security protocols could fail, causing a breach of personal health information.  Furthermore, I acknowledge that it is my responsibility to provide information about my medical history, conditions and care that is complete and accurate to the best of my ability. I acknowledge that Practitioner's advice, recommendations, and/or decision may be based on factors not within their control, such as incomplete or inaccurate data provided by me or distortions of diagnostic images or specimens that may result from electronic transmissions. I understand that the practice of medicine is not an exact science and that Practitioner makes no warranties or guarantees regarding treatment outcomes. I acknowledge that a copy of this consent can be made available to me via my patient portal [facility] MyChart), or I can request a printed copy by calling the office of [facility].    I understand that my insurance will be billed for this visit.   I have read or had this consent read to me. I understand the contents of this consent, which adequately explains the benefits and risks of the Services being provided via telemedicine.  I have been provided ample opportunity to ask questions regarding this consent and the Services and have had my questions answered to my satisfaction. I give my informed consent for the services to be provided through the use of telemedicine in my medical care

## 2024-08-21 NOTE — Telephone Encounter (Signed)
   Name: Robin [name]  DOB: 1971/01/04  MRN: [id number]  Primary Cardiologist: Alvan Ronal BRAVO, MD (Inactive)   Preoperative team, please contact this patient and set up a phone call appointment for further preoperative risk assessment. Please obtain consent and complete medication review. Thank you for your help.  I confirm that guidance regarding antiplatelet and oral anticoagulation therapy has been completed and, if necessary, noted below.  None  I also confirmed the patient resides in the state of [place] . As per [facility] Medical Board telemedicine laws, the patient must reside in the state in which the provider is licensed.   Wyn Raddle, Jackee Shove, NP 08/21/2024, 8:25 AM  [facility]

## 2024-08-21 NOTE — Telephone Encounter (Signed)
   Pre-operative Risk Assessment    Patient Name: Robin [name]  DOB: [birth year] MRN: [id number]   Date of last office visit: 02/26/24 M[place] [place], NP Date of next office visit: NONE   Request for Surgical Clearance    Procedure: LEFT TKA   Date of Surgery:  Clearance TBD                                Surgeon:  DR. FIDEL Surgeon's Group or Practice Name:  [facility] Phone number:  [phone] [facility] Fax number:  [phone]   Type of Clearance Requested:   - Medical ; NONE INDICATED TO BE HELD   Type of Anesthesia:  Spinal   Additional requests/questions:    Bonney Niels Jest   08/21/2024, 8:12 AM

## 2024-08-26 ENCOUNTER — Encounter: Payer: Self-pay | Admitting: Sports Medicine

## 2024-08-27 ENCOUNTER — Other Ambulatory Visit: Payer: Self-pay

## 2024-08-28 ENCOUNTER — Encounter (INDEPENDENT_AMBULATORY_CARE_PROVIDER_SITE_OTHER): Payer: Self-pay

## 2024-08-29 ENCOUNTER — Other Ambulatory Visit: Payer: Self-pay | Admitting: Pharmacy Technician

## 2024-08-29 ENCOUNTER — Other Ambulatory Visit: Payer: Self-pay

## 2024-08-29 NOTE — Progress Notes (Signed)
 Specialty Pharmacy Refill Coordination Note  IVORI STORR is a 53 y.o. female contacted today regarding refills of specialty medication(s) Abatacept  (Orencia  ClickJect)   Patient requested (Patient-Rptd) Delivery   Delivery date: 09/02/24 Verified address: (Patient-Rptd) [address]   Medication will be filled on 09/01/24.

## 2024-09-01 ENCOUNTER — Other Ambulatory Visit: Payer: Self-pay

## 2024-09-01 ENCOUNTER — Other Ambulatory Visit (HOSPITAL_COMMUNITY): Payer: Self-pay

## 2024-09-02 ENCOUNTER — Ambulatory Visit: Attending: Cardiology

## 2024-09-02 DIAGNOSIS — Z01810 Encounter for preprocedural cardiovascular examination: Secondary | ICD-10-CM

## 2024-09-02 NOTE — Progress Notes (Signed)
 Virtual Visit via Telephone Note   Because of Robin [name] co-morbid illnesses, she is at least at moderate risk for complications without adequate follow up.  This format is felt to be most appropriate for this patient at this time.  Due to technical limitations with video connection (technology), today's appointment will be conducted as an audio only telehealth visit, and Robin [name] verbally agreed to proceed in this manner.   All issues noted in this document were discussed and addressed.  No physical exam could be performed with this format.  Evaluation Performed:  Preoperative cardiovascular risk assessment _____________   Date:  09/02/2024   Patient ID:  Robin [name], DOB 16-Oct-1971, MRN [id number] Patient Location:  Home Provider location:   Office  Primary Care Provider:  Buck Search, PA-C Primary Cardiologist:  Alvan Ronal BRAVO, MD (Inactive)  Chief Complaint / Patient Profile   53 y.o. y/o female with a h/o hypertension, OSA, GERD, who is pending left TKA and presents today for telephonic preoperative cardiovascular risk assessment.  History of Present Illness    Robin [name] is a 53 y.o. female who presents via audio/video conferencing for a telehealth visit today.  Pt was last seen in cardiology clinic on 02/26/2024 by [facility] NP-C.  At that time Robin [name] was doing well .  The patient is now pending procedure as outlined above. Since her last visit, she continues to be stable from a cardiac standpoint.  Today she denies chest pain, shortness of breath, lower extremity edema, fatigue, palpitations, melena, hematuria, hemoptysis, diaphoresis, weakness, presyncope, syncope, orthopnea, and PND.   Past Medical History    Past Medical History:  Diagnosis Date   Allergy    Anxiety    Asthma    Bilateral carpal tunnel syndrome    Degeneration of lumbar intervertebral disc    Depression    GERD (gastroesophageal reflux  disease)    Hyperlipidemia    Hypertension    OSA (obstructive sleep apnea)    has cpap   Osteoarthritis of both knees    Pneumonia    Rheumatoid arthritis (HCC)    Sarcoidosis    Spinal stenosis    Vitamin D deficiency    Past Surgical History:  Procedure Laterality Date   ABDOMINAL HYSTERECTOMY     DEBRIDEMENT AND CLOSURE WOUND N/A 06/15/2021   Procedure: Irrigation and debridement and closure of spine wound;  Surgeon: Burnetta Aures, MD;  Location: [facility] OR;  Service: Orthopedics;  Laterality: N/A;   ESOPHAGOGASTRODUODENOSCOPY     FOOT SURGERY Bilateral    Both surgeries for bunions by Dr. Teresita per pt   gastric sleeve surgery  10/16/2023   KNEE ARTHROPLASTY Right 06/11/2024   Procedure: ARTHROPLASTY, KNEE, TOTAL, USING IMAGELESS COMPUTER-ASSISTED NAVIGATION;  Surgeon: Fidel Rogue, MD;  Location: [facility] ORS;  Service: Orthopedics;  Laterality: Right;   TRANSFORAMINAL LUMBAR INTERBODY FUSION (TLIF) WITH PEDICLE SCREW FIXATION 1 LEVEL N/A 05/05/2021   Procedure: TRANSFORAMINAL LUMBAR INTERBODY FUSION (TLIF) WITH PEDICLE SCREW FIXATION 1 LEVEL L2-3;  Surgeon: Burnetta Aures, MD;  Location: [facility] OR;  Service: Orthopedics;  Laterality: N/A;  4 hrs    Allergies  Allergies  Allergen Reactions   Lisinopril Cough    Home Medications    Prior to Admission medications   Medication Sig Start Date End Date Taking? Authorizing Provider  Abatacept  (ORENCIA  CLICKJECT) 125 MG/ML SOAJ Inject 125 mg into the skin once a week. Patient not taking: Reported on 07/29/2024 07/17/24   Rice,  Lonni ORN, MD  acetaminophen  (TYLENOL ) 650 MG CR tablet Take 1,300 mg by mouth every 8 (eight) hours as needed (pain).    [provider]  albuterol  (VENTOLIN  HFA) 108 (90 Base) MCG/ACT inhaler Inhale 2 puffs into the lungs every 6 (six) hours as needed for wheezing or shortness of breath. 01/30/22   Adella Norris, MD  amLODipine  (NORVASC ) 10 MG tablet Take 1 tablet (10 mg total) by mouth daily.  01/30/22   Adella Norris, MD  atorvastatin  (LIPITOR) 20 MG tablet Take 1 tablet (20 mg total) by mouth daily. 01/30/22   Adella Norris, MD  BIOTIN PO Take 1 tablet by mouth daily.    [provider]  buPROPion  (WELLBUTRIN  SR) 200 MG 12 hr tablet Take 100-200 mg by mouth See admin instructions. Take 200 mg by mouth in the morning and take 100 mg in the evening 07/25/23   [provider]  CALCIUM  PO Take 1 tablet by mouth daily.    [provider]  carvedilol  (COREG ) 12.5 MG tablet Take 1 tablet (12.5 mg total) by mouth 2 (two) times daily with a meal. 01/31/23   Alvan Ronal BRAVO, MD  cetirizine (ZYRTEC) 10 MG tablet Take 10 mg by mouth daily as needed for allergies. 11/27/22 07/29/24  [provider]  diclofenac  Sodium (VOLTAREN ) 1 % GEL Apply 2 g topically daily as needed (pain).    [provider]  Docusate Sodium  (DSS) 100 MG CAPS as needed.    [provider]  escitalopram  (LEXAPRO ) 20 MG tablet Take 20 mg by mouth daily. 11/27/22 07/29/24  [provider]  fluticasone  (FLONASE ) 50 MCG/ACT nasal spray Place 1 spray into both nostrils daily as needed for allergies. 11/27/22   [provider]  GAVILAX 17 GM/SCOOP powder Take 119 g by mouth daily as needed for moderate constipation. 12/03/23   [provider]  hydrALAZINE  (APRESOLINE ) 25 MG tablet Take 25 mg by mouth daily. Patient not taking: Reported on 07/29/2024    [provider]  HYDROcodone -acetaminophen  (NORCO) 10-325 MG tablet Take 1 tablet by mouth every 8 (eight) hours as needed. 07/01/24   [provider]  losartan -hydrochlorothiazide  (HYZAAR) 100-25 MG tablet Take 1 tablet by mouth daily. 08/23/23   Alvan Ronal BRAVO, MD  Melatonin 10 MG CAPS Take 20 mg by mouth at bedtime as needed (sleep).    [provider]  methocarbamol  (ROBAXIN ) 500 MG tablet Take 1 tablet (500 mg total) by mouth every 6 (six) hours as needed for muscle spasms. 06/12/24    Leigh Valery RAMAN, PA-C  Multiple Vitamin (MULTIVITAMIN ADULT PO) Take 1 tablet by mouth daily.    [provider]  omeprazole (PRILOSEC) 40 MG capsule Take 40 mg by mouth daily. 09/10/23   [provider]  OVER THE COUNTER MEDICATION Take 1 tablet by mouth daily. Menopause Supplement    [provider]  oxyCODONE  (ROXICODONE ) 5 MG immediate release tablet Take 1-2 tablets (5-10 mg total) by mouth every 4 (four) hours as needed for severe pain (pain score 7-10). Patient not taking: Reported on 07/29/2024 06/12/24   Leigh Valery RAMAN, PA-C  pregabalin  (LYRICA ) 75 MG capsule Take 1 capsule (75 mg total) by mouth 2 (two) times daily. 07/29/24   Jeannetta Lonni ORN, MD  triamcinolone  lotion (KENALOG ) 0.1 % Apply 1 Application topically 3 (three) times daily. 02/19/24   [provider]    Physical Exam    Vital Signs:  Robin [name] does not have vital signs  available for review today.  Given telephonic nature of communication, physical exam is limited. AAOx3. NAD. Normal affect.  Speech and respirations are unlabored.  Accessory Clinical Findings    None  Assessment & Plan    1.  Preoperative Cardiovascular Risk Assessment: LEFT TKA    Date of Surgery:  Clearance TBD                                  Surgeon:  DR. FIDEL Surgeon's Group or Practice Name:  [facility] Phone number:  [phone] [facility] Fax number:  [phone]      Primary Cardiologist: Alvan Ronal BRAVO, MD (Inactive)  Chart reviewed as part of pre-operative protocol coverage. Given past medical history and time since last visit, based on ACC/AHA guidelines, Robin [name] would be at acceptable risk for the planned procedure without further cardiovascular testing.   Her RCRI is very low risk, 0.4% risk of major cardiac event.  She is able to complete greater than 4 METS of physical activity.  Patient was advised that if she develops new symptoms prior to surgery to  contact our office to arrange a follow-up appointment.  She verbalized understanding.  I will route this recommendation to the requesting party via Epic fax function and remove from pre-op pool.       Time:   Today, I have spent 5 minutes with the patient with telehealth technology discussing medical history, symptoms, and management plan.  I spent 10 minutes reviewing patient's past cardiac history and cardiac medications.    Robin CHRISTELLA Beauvais, NP  09/02/2024, 7:23 AM

## 2024-09-22 ENCOUNTER — Other Ambulatory Visit: Payer: Self-pay | Admitting: Pharmacy Technician

## 2024-09-22 ENCOUNTER — Encounter (INDEPENDENT_AMBULATORY_CARE_PROVIDER_SITE_OTHER): Payer: Self-pay

## 2024-09-22 ENCOUNTER — Other Ambulatory Visit: Payer: Self-pay

## 2024-09-22 NOTE — Progress Notes (Signed)
 Specialty Pharmacy Refill Coordination Note  Robin [name] is a 53 y.o. female contacted today regarding refills of specialty medication(s) Abatacept  (Orencia  ClickJect)   Patient requested (Patient-Rptd) Delivery   Delivery date: 09/26/24 Verified address: (Patient-Rptd) [address]   Medication will be filled on 09/25/24.

## 2024-09-23 ENCOUNTER — Encounter: Payer: Self-pay | Admitting: Nurse Practitioner

## 2024-09-23 ENCOUNTER — Ambulatory Visit: Admitting: Nurse Practitioner

## 2024-09-23 VITALS — BP 132/76 | HR 100 | Ht 70.0 in | Wt 281.0 lb

## 2024-09-23 DIAGNOSIS — E66813 Obesity, class 3: Secondary | ICD-10-CM | POA: Diagnosis not present

## 2024-09-23 DIAGNOSIS — R0683 Snoring: Secondary | ICD-10-CM | POA: Diagnosis not present

## 2024-09-23 DIAGNOSIS — Z6841 Body Mass Index (BMI) 40.0 and over, adult: Secondary | ICD-10-CM

## 2024-09-23 DIAGNOSIS — G4733 Obstructive sleep apnea (adult) (pediatric): Secondary | ICD-10-CM | POA: Diagnosis not present

## 2024-09-23 LAB — TSH: TSH: 1.07 u[IU]/mL (ref 0.35–5.50)

## 2024-09-23 LAB — COMPREHENSIVE METABOLIC PANEL WITH GFR
ALT: 19 U/L (ref 0–35)
AST: 22 U/L (ref 0–37)
Albumin: 3.9 g/dL (ref 3.5–5.2)
Alkaline Phosphatase: 103 U/L (ref 39–117)
BUN: 15 mg/dL (ref 6–23)
CO2: 31 meq/L (ref 19–32)
Calcium: 9.4 mg/dL (ref 8.4–10.5)
Chloride: 105 meq/L (ref 96–112)
Creatinine, Ser: 0.75 mg/dL (ref 0.40–1.20)
GFR: 89.65 mL/min (ref >60.00)
Glucose, Bld: 93 mg/dL (ref 70–99)
Potassium: 3.5 meq/L (ref 3.5–5.1)
Sodium: 142 meq/L (ref 135–145)
Total Bilirubin: 0.4 mg/dL (ref 0.2–1.2)
Total Protein: 6.9 g/dL (ref 6.0–8.3)

## 2024-09-23 LAB — LIPID PANEL
Cholesterol: 148 mg/dL (ref 0–200)
HDL: 53.9 mg/dL (ref >39.00)
LDL Cholesterol: 73 mg/dL (ref 0–99)
NonHDL: 94.58
Total CHOL/HDL Ratio: 3
Triglycerides: 109 mg/dL (ref 0.0–149.0)
VLDL: 21.8 mg/dL (ref 0.0–40.0)

## 2024-09-23 LAB — HEMOGLOBIN A1C: Hgb A1c MFr Bld: 5.8 % (ref 4.6–6.5)

## 2024-09-23 MED ORDER — TIRZEPATIDE-WEIGHT MANAGEMENT 2.5 MG/0.5ML ~~LOC~~ SOLN: 2.5000 mg | SUBCUTANEOUS | Status: DC

## 2024-09-23 MED ORDER — ZEPBOUND 5 MG/0.5ML ~~LOC~~ SOAJ: 5.0000 mg | SUBCUTANEOUS | 0 refills | Status: DC

## 2024-09-23 MED ORDER — LOSARTAN POTASSIUM-HCTZ 100-25 MG PO TABS: 1.0000 | ORAL_TABLET | Freq: Every day | ORAL | 1 refills | Status: AC

## 2024-09-23 NOTE — Assessment & Plan Note (Signed)
 Severe OSA on CPAP. Difficulties with pressures and mask. Prior setting changes were never made by DME. Will update again today in [employer] and send another order. Trial change to AirTouch N30i mask in size medium - provided with sample in office. May need chin strap - order placed. Reviewed risks of untreated severe OSA. She does receive benefit from use. Encouraged continued healthy weight loss measures. Safe driving practices reviewed.   Patient has severe sleep apnea related to obesity with BMI 40, posing significant cardiovascular risks. Patient has failed traditional weight loss measures with diet and exercise for >/6 months. Patient will be initiated on Zepbound (tirzepatide) for weight management. Zepbound is the only pharmaceutical treatment approved for moderate-to-severe OSA in adults who are overweight (BMI >/27) or obese (BMI >/30). The patient will continue lifestyle modifications, including structured nutrition and physical activity as directed. No other GLP1 therapy will be used simultaneously at this time. The patient does not have any FDA labeled contraindications to this agent, including pregnancy, lactation, hx or family history of medullary thyroid  cancer, or multiple endocrine neoplasia type II. Side effect profile has been reviewed with patient. Aware of red flag symptoms to notify of immediately or seek emergency care, including severe nausea/vomiting, inability to pass bowels or gas, severe abdominal pain/tenderness, jaundice.    Patient Instructions  Continue to use CPAP every night, minimum of 4-6 hours a night.  Change equipment as directed. Wash your tubing with warm soap and water daily, hang to dry. Wash humidifier portion weekly. Use bottled, distilled water and change daily Be aware of reduced alertness and do not drive or operate heavy machinery if experiencing this or drowsiness.  Exercise encouraged, as tolerated. Healthy weight management discussed.  Avoid or decrease  alcohol consumption and medications that make you more sleepy, if possible. Notify if persistent daytime sleepiness occurs even with consistent use of PAP therapy.   I am going to adjust your settings to 10-14 cmH2O Try the new CPAP mask You may need to use a chin strap with it  You can also try CPAP nasal mask covers to protect your skin    Start at 2.5 mg - inject one dose once a week for 4 weeks. After this and if you are doing well, we will increase you every 4 weeks with goal of getting to 10-15 mg once weekly for maintenance  Work on diet measures and exercise - 150 min/week. 30 minutes of strength training 3-5 days a week  We reviewed emergent symptoms to notify of immediately or seek emergency care, including severe nausea/vomiting, inability to pass bowels or gas, severe abdominal pain/tenderness, jaundice, swelling of the face/tongue.  Call if you are having difficulties with any side effects so we can help manage these  Labs today   Follow up in 6 weeks with Katie Maykayla Highley,NP. If symptoms do not improve or worsen, please contact office for sooner follow up

## 2024-09-23 NOTE — Progress Notes (Signed)
 @Patient  ID: Robin [name], female    DOB: [birth year], 53 y.o.   MRN: [id number]  Chief Complaint  Patient presents with   Sleep Apnea    Last wore CPAP 05/2024- could not tolerate mask.     Referring provider: Buck Search, PA-C  HPI: 53 year old female, former remote smoker followed for severe OSA. Past medical history significant for HTN, OSA not on CPAP, RA, anxiety, depression, vit d deficiency.   TEST/EVENTS:  2010 NPSG: AHI 8/h 06/12/2023 Split Night Study: AHI 30.7/h, SpO2 low 88% >> optimal pressure 14 cmH2O; weight 330 lb  02/15/2023: Ov with Robin Qian NP for sleep consult, referred by Robin [name].  She was previously seen in our office by Robin [name] in 2015.  She had mild obstructive sleep apnea diagnosed in 2010.  She was started on CPAP therapy and had been on it for many years.  At some point, she lost her insurance and stopped receiving supplies so she stopped wearing the CPAP a few years ago.  She has had a lot of trouble with her sleep, especially over the last year.  She tells me that she wakes up often throughout the night.  Feels anxious when she does.  She has loud snoring.  She feels very tired throughout the day.  Wakes with a dry mouth most mornings.  Denies any morning headaches, drowsy driving, sleep parasomnia/paralysis.  No history of narcolepsy or cataplexy.  No witnessed apneas She goes to bed between 8 PM to 10 PM.  Usually takes a long time to fall asleep, around 4 to 5 hours.  Wakes 3 or more times a night.  Gets up for the day between 7 and 11 AM.  She is currently on disability.  Gained 30 pounds over the last 2 years.  Does not take any sleep aids. She has a history of high blood pressure, which has been difficult to control recently.  That is another reason why she was encouraged to come back for sleep evaluation.  She also has asthma, high cholesterol and allergies. She was a former remote smoker, quit in 1998.  Drinks alcohol occasionally, usually 1  glass a month.  Drinks 1 to 2 cups of coffee a day.  Lives alone.  Family history of asthma, heart disease, rheumatism and cancer.   [place] 16  08/07/2023: OV with Robin Barber NP for follow up after split night study which revealed severe sleep apnea. She feels unchanged compared to our last visit. Continues to have a lot of trouble with her sleep. Wakes often throughout the night. Feels tired during the day. She denies any morning headaches, drowsy driving, sleep parasomnias/paralysis. She is preparing to have weight loss surgery. They were wanting her seen and started on CPAP therapy prior to this.   10/31/2023: OV with Robin Melfi NP.  The patient underwent gastric bypass surgery approximately two weeks ago and reports a smooth recovery process. She is on a liquid diet right now but will start incorporating more solids tomorrow, which she is looking forward to. She's lost 30 lb so far. Regarding her CPAP, she has been experiencing skin irritation from her mask, which has led to inconsistent use over the past few weeks. The patient has not yet tried any mask liners. She does like wearing her CPAP at night. She feels like she sleeps much better with it. Energy levels have been down the last two weeks with recovery and being off CPAP. The patient reports no issues with drowsy driving.  She has been using a large CPAP Dream Wear mask.   02/06/2024: Robin [name] with Robin Saxton NP Patient presents today for follow up. She has been back on CPAP and doing well on CPAP since her last visit for the most part. She has continued to lose weight and is down another 20 lb. She feels like sometimes her mask blows too hard, which results in her removing it at night. She does feel like when she wears the CPAP, she sleeps better and wakes up more refreshed. Energy levels are better. She is wearing a full face mask - irritation subsided with mask liners. Denies any drowsy driving or sleep parasomnias/paralysis. 01/06/2024-02/04/2024: CPAP 12-16  cmH2O 28/30 days; 73% >4 hr; average use 5 hr 45 min Pressure 95th 12.5 Leaks 95th 30.5 AHI 2.7  09/23/2024: Today - follow up Discussed the use of AI scribe software for clinical note transcription with the patient, who gave verbal consent to proceed.  History of Present Illness Robin [name] is a 53 year old female with sleep apnea who presents with issues related to CPAP use.  She is experiencing difficulties with her CPAP therapy, primarily due to discomfort and anxiety associated with the full face mask. She feels 'real nervous' and anxious when attempting to wear the mask, a sensation that began after her knee replacement surgery. Additionally, she has skin irritation, including flakiness and occasional bleeding around her nose and mouth, despite using a cover for the mask.  She notes that the pressure from the full face mask felt 'a little strong'.  She is working on weight loss, which she believes may help with her sleep apnea. She mentions being at a weight loss plateau for months and is considering additional interventions to assist with this. She engages in physical activity, including walking in the mornings, and is planning further knee surgery, which she hopes will alleviate some back pain she is experiencing.  No drowsy driving. Feels better when she's able to use her CPAP. Energy levels improve with it. Feels fatigued without it.    Allergies  Allergen Reactions   Lisinopril Cough    Immunization History  Administered Date(s) Administered   Influenza,inj,quad, With Preservative 09/25/2019   Influenza-Unspecified 10/20/2017   MODERNA COVID-19 SARS-COV-2 PEDS BIVALENT BOOSTER 81yr-10yr 01/07/2020, 02/04/2020   Unspecified SARS-COV-2 Vaccination 01/07/2020, 02/04/2020    Past Medical History:  Diagnosis Date   Allergy    Anxiety    Asthma    Bilateral carpal tunnel syndrome    Degeneration of lumbar intervertebral disc    Depression    GERD  (gastroesophageal reflux disease)    Hyperlipidemia    Hypertension    OSA (obstructive sleep apnea)    has cpap   Osteoarthritis of both knees    Pneumonia    Rheumatoid arthritis (HCC)    Sarcoidosis    Spinal stenosis    Vitamin D deficiency     Tobacco History: Social History   Tobacco Use  Smoking Status Former   Current packs/day: 0.00   Average packs/day: 0.1 packs/day for 2.0 years (0.2 ttl pk-yrs)   Types: Cigarettes   Start date: 12/25/1994   Quit date: 12/25/1996   Years since quitting: 27.7   Passive exposure: Never  Smokeless Tobacco Never  Tobacco Comments   smoked 2-3 cigs daily x 2 years   Counseling given: Not Answered Tobacco comments: smoked 2-3 cigs daily x 2 years   Outpatient Medications Prior to Visit  Medication Sig Dispense Refill  Abatacept  (ORENCIA  CLICKJECT) 125 MG/ML SOAJ Inject 125 mg into the skin once a week. 12 mL 0   acetaminophen  (TYLENOL ) 650 MG CR tablet Take 1,300 mg by mouth every 8 (eight) hours as needed (pain).     albuterol  (VENTOLIN  HFA) 108 (90 Base) MCG/ACT inhaler Inhale 2 puffs into the lungs every 6 (six) hours as needed for wheezing or shortness of breath. 18 g 2   amLODipine  (NORVASC ) 10 MG tablet Take 1 tablet (10 mg total) by mouth daily. 30 tablet 11   atorvastatin  (LIPITOR) 20 MG tablet Take 1 tablet (20 mg total) by mouth daily. 30 tablet 11   BIOTIN PO Take 1 tablet by mouth daily.     buPROPion  (WELLBUTRIN  SR) 200 MG 12 hr tablet Take 100-200 mg by mouth See admin instructions. Take 200 mg by mouth in the morning and take 100 mg in the evening     CALCIUM  PO Take 1 tablet by mouth daily.     carvedilol  (COREG ) 12.5 MG tablet Take 1 tablet (12.5 mg total) by mouth 2 (two) times daily with a meal. 60 tablet 5   cetirizine (ZYRTEC) 10 MG tablet Take 10 mg by mouth daily as needed for allergies.     Docusate Sodium  (DSS) 100 MG CAPS as needed.     escitalopram  (LEXAPRO ) 20 MG tablet Take 20 mg by mouth daily.      fluticasone  (FLONASE ) 50 MCG/ACT nasal spray Place 1 spray into both nostrils daily as needed for allergies.     GAVILAX 17 GM/SCOOP powder Take 119 g by mouth daily as needed for moderate constipation.     hydrALAZINE  (APRESOLINE ) 25 MG tablet Take 25 mg by mouth daily.     HYDROcodone -acetaminophen  (NORCO) 10-325 MG tablet Take 1 tablet by mouth every 8 (eight) hours as needed.     losartan -hydrochlorothiazide  (HYZAAR) 100-25 MG tablet Take 1 tablet by mouth daily. 90 tablet 3   Melatonin 10 MG CAPS Take 20 mg by mouth at bedtime as needed (sleep).     methocarbamol  (ROBAXIN ) 500 MG tablet Take 1 tablet (500 mg total) by mouth every 6 (six) hours as needed for muscle spasms. 20 tablet 0   Multiple Vitamin (MULTIVITAMIN ADULT PO) Take 1 tablet by mouth daily.     omeprazole (PRILOSEC) 40 MG capsule Take 40 mg by mouth daily.     OVER THE COUNTER MEDICATION Take 1 tablet by mouth daily. Menopause Supplement     pregabalin  (LYRICA ) 75 MG capsule Take 1 capsule (75 mg total) by mouth 2 (two) times daily. 180 capsule 0   triamcinolone  lotion (KENALOG ) 0.1 % Apply 1 Application topically 3 (three) times daily.     diclofenac  Sodium (VOLTAREN ) 1 % GEL Apply 2 g topically daily as needed (pain).     oxyCODONE  (ROXICODONE ) 5 MG immediate release tablet Take 1-2 tablets (5-10 mg total) by mouth every 4 (four) hours as needed for severe pain (pain score 7-10). (Patient not taking: Reported on 07/29/2024) 56 tablet 0   No facility-administered medications prior to visit.     Review of Systems:   Constitutional: No night sweats, fevers, chills, or lassitude. +daytime fatigue, intentional weight loss HEENT: No headaches, difficulty swallowing, tooth/dental problems, or sore throat. No sneezing, itching, ear ache. +nasal congestion (baseline) CV:  No chest pain, orthopnea, PND, swelling in lower extremities, anasarca, dizziness, palpitations, syncope Resp: +snoring, shortness of breath with exertion  (baseline); occasional cough (baseline). No excess mucus or change in color of  mucus. No productive or non-productive. No hemoptysis. No wheezing.  No chest wall deformity GI:  No heartburn, indigestion GU: No nocturia  Skin: No rash, lesion, ulceration  MSK:  +chronic back pain, knee pain  Neuro: No dizziness or lightheadedness.  Psych: +depression, anxiety (stable); sleep disturbance (improves with CPAP). No SI/HI. Mood stable.     Physical Exam:  BP 132/76   Pulse 100   Ht 5' 10 (1.778 m)   Wt 281 lb (127.5 kg)   SpO2 97%   BMI 40.32 kg/m   GEN: Pleasant, interactive, well-appearing; obese; in no acute distress. HEENT:  Normocephalic and atraumatic. PERRLA. Sclera white. Nasal turbinates pink, moist and patent bilaterally. No rhinorrhea present. Oropharynx pink and moist, without exudate or edema. No lesions, ulcerations, or postnasal drip. Mallampati III NECK:  Supple w/ fair ROM.  CV: RRR, no m/r/g PULMONARY:  Unlabored, regular breathing. Clear bilaterally A&P w/o wheezes/rales/rhonchi. No accessory muscle use.  GI: BS present and normoactive. Soft, non-tender to palpation. MSK: No erythema, warmth or tenderness. Cap refil <2 sec all extrem.  Neuro: A/Ox3. No focal deficits noted.   Skin: Warm, no lesions or rashe Psych: Normal affect and behavior. Judgement and thought content appropriate.     Lab Results:  CBC    Component Value Date/Time   WBC 13.7 (H) 06/12/2024 0352   RBC 3.98 06/12/2024 0352   HGB 11.3 (L) 06/12/2024 0352   HGB 13.5 01/30/2022 1637   HCT 35.5 (L) 06/12/2024 0352   HCT 42.7 01/30/2022 1637   PLT 239 06/12/2024 0352   PLT 312 01/30/2022 1637   MCV 89.2 06/12/2024 0352   MCV 85 01/30/2022 1637   MCH 28.4 06/12/2024 0352   MCHC 31.8 06/12/2024 0352   RDW 13.0 06/12/2024 0352   RDW 11.8 01/30/2022 1637   LYMPHSABS 5,185 (H) 07/25/2023 1514   LYMPHSABS 4.2 (H) 01/30/2022 1637   MONOABS 1.5 (H) 04/19/2022 1015   EOSABS 173 04/28/2024 1514    EOSABS 0.3 01/30/2022 1637   BASOSABS 51 04/28/2024 1514   BASOSABS 0.0 01/30/2022 1637    BMET    Component Value Date/Time   NA 134 (L) 06/12/2024 0352   NA 143 04/04/2022 1426   K 3.3 (L) 06/12/2024 0352   CL 101 06/12/2024 0352   CO2 24 06/12/2024 0352   GLUCOSE 118 (H) 06/12/2024 0352   BUN 12 06/12/2024 0352   BUN 15 04/04/2022 1426   CREATININE 0.72 06/12/2024 0352   CREATININE 0.73 04/28/2024 1514   CALCIUM  8.0 (L) 06/12/2024 0352   GFRNONAA >60 06/12/2024 0352   GFRAA  10/12/2009 0600    >60        The eGFR has been calculated using the MDRD equation. This calculation has not been validated in all clinical situations. eGFR's persistently <60 mL/min signify possible Chronic Kidney Disease.    BNP No results found for: BNP   Imaging:  No results found.  Administration History     None           No data to display          No results found for: NITRICOXIDE      Assessment & Plan:   OSA (obstructive sleep apnea) Severe OSA on CPAP. Difficulties with pressures and mask. Prior setting changes were never made by DME. Will update again today in [facility] and send another order. Trial change to AirTouch N30i mask in size medium - provided with sample in office. May need chin strap -  order placed. Reviewed risks of untreated severe OSA. She does receive benefit from use. Encouraged continued healthy weight loss measures. Safe driving practices reviewed.   Patient has severe sleep apnea related to obesity with BMI 40, posing significant cardiovascular risks. Patient has failed traditional weight loss measures with diet and exercise for >/6 months. Patient will be initiated on Zepbound (tirzepatide) for weight management. Zepbound is the only pharmaceutical treatment approved for moderate-to-severe OSA in adults who are overweight (BMI >/27) or obese (BMI >/30). The patient will continue lifestyle modifications, including structured nutrition and  physical activity as directed. No other GLP1 therapy will be used simultaneously at this time. The patient does not have any FDA labeled contraindications to this agent, including pregnancy, lactation, hx or family history of medullary thyroid  cancer, or multiple endocrine neoplasia type II. Side effect profile has been reviewed with patient. Aware of red flag symptoms to notify of immediately or seek emergency care, including severe nausea/vomiting, inability to pass bowels or gas, severe abdominal pain/tenderness, jaundice.    Patient Instructions  Continue to use CPAP every night, minimum of 4-6 hours a night.  Change equipment as directed. Wash your tubing with warm soap and water daily, hang to dry. Wash humidifier portion weekly. Use bottled, distilled water and change daily Be aware of reduced alertness and do not drive or operate heavy machinery if experiencing this or drowsiness.  Exercise encouraged, as tolerated. Healthy weight management discussed.  Avoid or decrease alcohol consumption and medications that make you more sleepy, if possible. Notify if persistent daytime sleepiness occurs even with consistent use of PAP therapy.   I am going to adjust your settings to 10-14 cmH2O Try the new CPAP mask You may need to use a chin strap with it  You can also try CPAP nasal mask covers to protect your skin    Start at 2.5 mg - inject one dose once a week for 4 weeks. After this and if you are doing well, we will increase you every 4 weeks with goal of getting to 10-15 mg once weekly for maintenance  Work on diet measures and exercise - 150 min/week. 30 minutes of strength training 3-5 days a week  We reviewed emergent symptoms to notify of immediately or seek emergency care, including severe nausea/vomiting, inability to pass bowels or gas, severe abdominal pain/tenderness, jaundice, swelling of the face/tongue.  Call if you are having difficulties with any side effects so we can help  manage these  Labs today   Follow up in 6 weeks with Katie Mamye Bolds,NP. If symptoms do not improve or worsen, please contact office for sooner follow up      Obesity, Class III, BMI 40-49.9 (morbid obesity) (HCC) BMI 40. Will initiate GLP 1 therapy. See above plan     I spent 45 minutes of dedicated to the care of this patient on the date of this encounter to include pre-visit review of records, face-to-face time with the patient discussing conditions above, post visit ordering of testing, clinical documentation with the electronic health record, making appropriate referrals as documented, and communicating necessary findings to members of the patients care team.  Comer LULLA Rouleau, NP 09/23/2024  Pt aware and understands NP's role.

## 2024-09-23 NOTE — Patient Instructions (Addendum)
 Continue to use CPAP every night, minimum of 4-6 hours a night.  Change equipment as directed. Wash your tubing with warm soap and water daily, hang to dry. Wash humidifier portion weekly. Use bottled, distilled water and change daily Be aware of reduced alertness and do not drive or operate heavy machinery if experiencing this or drowsiness.  Exercise encouraged, as tolerated. Healthy weight management discussed.  Avoid or decrease alcohol consumption and medications that make you more sleepy, if possible. Notify if persistent daytime sleepiness occurs even with consistent use of PAP therapy.   I am going to adjust your settings to 10-14 cmH2O Try the new CPAP mask You may need to use a chin strap with it  You can also try CPAP nasal mask covers to protect your skin    Start at 2.5 mg - inject one dose once a week for 4 weeks. After this and if you are doing well, we will increase you every 4 weeks with goal of getting to 10-15 mg once weekly for maintenance  Work on diet measures and exercise - 150 min/week. 30 minutes of strength training 3-5 days a week  We reviewed emergent symptoms to notify of immediately or seek emergency care, including severe nausea/vomiting, inability to pass bowels or gas, severe abdominal pain/tenderness, jaundice, swelling of the face/tongue.  Call if you are having difficulties with any side effects so we can help manage these  Labs today   Follow up in 6 weeks with Robin Macaiah Mangal,NP. If symptoms do not improve or worsen, please contact office for sooner follow up

## 2024-09-23 NOTE — Assessment & Plan Note (Signed)
 BMI 40. Will initiate GLP 1 therapy. See above plan

## 2024-09-24 ENCOUNTER — Other Ambulatory Visit: Payer: Self-pay

## 2024-09-24 ENCOUNTER — Ambulatory Visit: Payer: Self-pay | Admitting: Nurse Practitioner

## 2024-09-24 NOTE — Progress Notes (Signed)
 Labs nl. Ok to start Zepbound as discussed yesterday. Thanks!

## 2024-10-01 ENCOUNTER — Encounter: Payer: Self-pay | Admitting: Family

## 2024-10-01 ENCOUNTER — Other Ambulatory Visit: Payer: Self-pay | Admitting: Family

## 2024-10-01 DIAGNOSIS — R412 Retrograde amnesia: Secondary | ICD-10-CM

## 2024-10-08 ENCOUNTER — Other Ambulatory Visit: Payer: Self-pay | Admitting: Family

## 2024-10-08 DIAGNOSIS — R6889 Other general symptoms and signs: Secondary | ICD-10-CM

## 2024-10-08 DIAGNOSIS — R412 Retrograde amnesia: Secondary | ICD-10-CM

## 2024-10-08 NOTE — Progress Notes (Signed)
 Sent message, via epic in basket, requesting orders in epic from Careers adviser.

## 2024-10-13 ENCOUNTER — Encounter: Payer: Self-pay | Admitting: Psychology

## 2024-10-13 NOTE — Patient Instructions (Addendum)
 SURGICAL WAITING ROOM VISITATION Patients having surgery or a procedure may have no more than 2 support people in the waiting area - these visitors may rotate in the visitor waiting room.   Due to an increase in RSV and influenza rates and associated hospitalizations, children ages 9 and under may not visit patients in [facility] hospitals. If the patient needs to stay at the hospital during part of their recovery, the visitor guidelines for inpatient rooms apply.  PRE-OP VISITATION  Pre-op nurse will coordinate an appropriate time for 1 support person to accompany the patient in pre-op.  This support person may not rotate.  This visitor will be contacted when the time is appropriate for the visitor to come back in the pre-op area.  Please refer to the [facility] website for the visitor guidelines for Inpatients (after your surgery is over and you are in a regular room).  You are not required to quarantine at this time prior to your surgery. However, you must do this: Hand Hygiene often Do NOT share personal items Notify your provider if you are in close contact with someone who has COVID or you develop fever 100.4 or greater, new onset of sneezing, cough, sore throat, shortness of breath or body aches.  If you test positive for Covid or have been in contact with anyone that has tested positive in the last 10 days please notify you surgeon.    Your procedure is scheduled on:  10/22/24  Report to [facility] Main Entrance: [place] entrance where the [employer] is available.   Report to admitting at: 8:00 AM  Call this number if you have any questions or problems the morning of surgery [phone]  FOLLOW ANY ADDITIONAL PRE OP INSTRUCTIONS YOU RECEIVED FROM YOUR SURGEON'S OFFICE!!!  Do not eat food after Midnight the night prior to your surgery/procedure.  After Midnight you may have the following liquids until: 7:30 AM DAY OF SURGERY  Clear Liquid Diet Water Black  Coffee (sugar ok, NO MILK/CREAM OR CREAMERS)  Tea (sugar ok, NO MILK/CREAM OR CREAMERS) regular and decaf                             Plain Jell-O  with no fruit (NO RED)                                           Fruit ices (not with fruit pulp, NO RED)                                     Popsicles (NO RED)                                                                  Juice: NO CITRUS JUICES: only apple, WHITE grape, WHITE cranberry Sports drinks like Gatorade or Powerade (NO RED)   The day of surgery:  Drink ONE (1) Pre-Surgery Clear Ensure  at : 7:30 AM the morning of surgery. Drink in one sitting. Do not sip.  This drink was  given to you during your hospital pre-op appointment visit. Nothing else to drink after completing the Pre-Surgery Clear Ensure or G2 : No candy, chewing gum or throat lozenges.    Oral Hygiene is also important to reduce your risk of infection.        Remember - BRUSH YOUR TEETH THE MORNING OF SURGERY WITH YOUR REGULAR TOOTHPASTE  Do NOT smoke after Midnight the night before surgery.  STOP TAKING all Vitamins, Herbs and supplements 1 week before your surgery.   Take ONLY these medicines the morning of surgery with A SIP OF WATER: pregabalin ,bupropion ,escitalopram ,cetirizine,hydralazine ,carvedilol ,amlodipine ,omeprazole.Tylenol  as needed.  HOLD ZEPBOUND after: 10/14/24  If You have been diagnosed with Sleep Apnea - Bring CPAP mask and tubing day of surgery. We will provide you with a CPAP machine on the day of your surgery.                   You may not have any metal on your body including hair pins, jewelry, and body piercing  Do not wear make-up, lotions, powders, perfumes / cologne, or deodorant  Do not wear nail polish including gel and S&S, artificial / acrylic nails, or any other type of covering on natural nails including finger and toenails. If you have artificial nails, gel coating, etc., that needs to be removed by a nail salon, Please have this  removed prior to surgery. Not doing so may mean that your surgery could be cancelled or delayed if the Surgeon or anesthesia staff feels like they are unable to monitor you safely.   Do not shave 48 hours prior to surgery to avoid nicks in your skin which may contribute to postoperative infections.   Contacts, Hearing Aids, dentures or bridgework may not be worn into surgery. DENTURES WILL BE REMOVED PRIOR TO SURGERY PLEASE DO NOT APPLY Poly grip OR ADHESIVES!!!  You may bring a small overnight bag with you on the day of surgery, only pack items that are not valuable. [facility] IS NOT RESPONSIBLE   FOR VALUABLES THAT ARE LOST OR STOLEN.   Patients discharged on the day of surgery will not be allowed to drive home.  Someone NEEDS to stay with you for the first 24 hours after anesthesia.  Do not bring your home medications to the hospital. The Pharmacy will dispense medications listed on your medication list to you during your admission in the Hospital.  Special Instructions: Bring a copy of your healthcare power of attorney and living will documents the day of surgery, if you wish to have them scanned into your [facility] Records- EPIC  Please read over the following fact sheets you were given: IF YOU HAVE QUESTIONS ABOUT YOUR PRE-OP INSTRUCTIONS, PLEASE CALL [phone]   [facility] - Preparing for Surgery      Before surgery, you can play an important role.  Because skin is not sterile, your skin needs to be as free of germs as possible.  You can reduce the number of germs on your skin by washing with CHG (chlorahexidine gluconate) soap before  PATIENT SIGNATURE_________________________________  NURSE SIGNATURE__________________________________  ________________________________________________________________________  Pre-operative 4 CHG Bath Instructions  DYNA-Hex 4 Chlorhexidine  Gluconate 4% Solution Antiseptic 4 fl. oz   You can play a key role in reducing the risk of  infection after surgery. Your skin needs to be as free of germs as possible. You can reduce the number of germs on your skin by washing with CHG (chlorhexidine  gluconate) soap before surgery. CHG is an antiseptic  soap that kills germs and continues to kill germs even after washing.   DO NOT use if you have an allergy to chlorhexidine /CHG or antibacterial soaps. If your skin becomes reddened or irritated, stop using the CHG and notify one of our RNs at   Please shower with the CHG soap starting 4 days before surgery using the following schedule:     Please keep in mind the following:  DO NOT shave, including legs and underarms, starting the day of your first shower.   You may shave your face at any point before/day of surgery.  Place clean sheets on your bed the day you start using CHG soap. Use a clean washcloth (not used since being washed) for each shower. DO NOT sleep with pets once you start using the CHG.  CHG Shower Instructions:  If you choose to wash your hair and private area, wash first with your normal shampoo/soap.  After you use shampoo/soap, rinse your hair and body thoroughly to remove shampoo/soap residue.  Turn the water OFF and apply about 3 tablespoons (45 ml) of CHG soap to a CLEAN washcloth.  Apply CHG soap ONLY FROM YOUR NECK DOWN TO YOUR TOES (washing for 3-5 minutes)  DO NOT use CHG soap on face, private areas, open wounds, or sores.  Pay special attention to the area where your surgery is being performed.  If you are having back surgery, having someone wash your back for you may be helpful. Wait 2 minutes after CHG soap is applied, then you may rinse off the CHG soap.  Pat dry with a clean towel  Put on clean clothes/pajamas   If you choose to wear lotion, please use ONLY the CHG-compatible lotions on the back of this paper.     Additional instructions for the day of surgery: DO NOT APPLY any lotions, deodorants, cologne, or perfumes.   Put on clean/comfortable  clothes.  Brush your teeth.  Ask your nurse before applying any prescription medications to the skin.   CHG Compatible Lotions   Aveeno Moisturizing lotion  Cetaphil Moisturizing Cream  Cetaphil Moisturizing Lotion  Clairol Herbal Essence Moisturizing Lotion, Dry Skin  Clairol Herbal Essence Moisturizing Lotion, Extra Dry Skin  Clairol Herbal Essence Moisturizing Lotion, Normal Skin  Curel Age Defying Therapeutic Moisturizing Lotion with Alpha Hydroxy  Curel Extreme Care Body Lotion  Curel Soothing Hands Moisturizing Hand Lotion  Curel Therapeutic Moisturizing Cream, Fragrance-Free  Curel Therapeutic Moisturizing Lotion, Fragrance-Free  Curel Therapeutic Moisturizing Lotion, Original Formula  Eucerin Daily Replenishing Lotion  Eucerin Dry Skin Therapy Plus Alpha Hydroxy Crme  Eucerin Dry Skin Therapy Plus Alpha Hydroxy Lotion  Eucerin Original Crme  Eucerin Original Lotion  Eucerin Plus Crme Eucerin Plus Lotion  Eucerin TriLipid Replenishing Lotion  Keri Anti-Bacterial Hand Lotion  Keri Deep Conditioning Original Lotion Dry Skin Formula Softly Scented  Keri Deep Conditioning Original Lotion, Fragrance Free Sensitive Skin Formula  Keri Lotion Fast Absorbing Fragrance Free Sensitive Skin Formula  Keri Lotion Fast Absorbing Softly Scented Dry Skin Formula  Keri Original Lotion  Keri Skin Renewal Lotion Keri Silky Smooth Lotion  Keri Silky Smooth Sensitive Skin Lotion  Nivea Body Creamy Conditioning Oil  Nivea Body Extra Enriched Teacher, adult education Moisturizing Lotion Nivea Crme  Nivea Skin Firming Lotion  NutraDerm 30 Skin Lotion  NutraDerm Skin Lotion  NutraDerm Therapeutic Skin Cream  NutraDerm Therapeutic Skin Lotion  ProShield Protective Hand Cream  Provon moisturizing lotion  Incentive Spirometer  An incentive spirometer is a tool that can help keep your lungs clear and active. This tool measures how well you are filling your  lungs with each breath. Taking long deep breaths may help reverse or decrease the chance of developing breathing (pulmonary) problems (especially infection) following: A long period of time when you are unable to move or be active. BEFORE THE PROCEDURE  If the spirometer includes an indicator to show your best effort, your nurse or respiratory therapist will set it to a desired goal. If possible, sit up straight or lean slightly forward. Try not to slouch. Hold the incentive spirometer in an upright position. INSTRUCTIONS FOR USE  Sit on the edge of your bed if possible, or sit up as far as you can in bed or on a chair. Hold the incentive spirometer in an upright position. Breathe out normally. Place the mouthpiece in your mouth and seal your lips tightly around it. Breathe in slowly and as deeply as possible, raising the piston or the ball toward the top of the column. Hold your breath for 3-5 seconds or for as long as possible. Allow the piston or ball to fall to the bottom of the column. Remove the mouthpiece from your mouth and breathe out normally. Rest for a few seconds and repeat Steps 1 through 7 at least 10 times every 1-2 hours when you are awake. Take your time and take a few normal breaths between deep breaths. The spirometer may include an indicator to show your best effort. Use the indicator as a goal to work toward during each repetition. After each set of 10 deep breaths, practice coughing to be sure your lungs are clear. If you have an incision (the cut made at the time of surgery), support your incision when coughing by placing a pillow or rolled up towels firmly against it. Once you are able to get out of bed, walk around indoors and cough well. You may stop using the incentive spirometer when instructed by your caregiver.  RISKS AND COMPLICATIONS Take your time so you do not get dizzy or light-headed. If you are in pain, you may need to take or ask for pain medication before  doing incentive spirometry. It is harder to take a deep breath if you are having pain. AFTER USE Rest and breathe slowly and easily. It can be helpful to keep track of a log of your progress. Your caregiver can provide you with a simple table to help with this. If you are using the spirometer at home, follow these instructions: SEEK MEDICAL CARE IF:  You are having difficultly using the spirometer. You have trouble using the spirometer as often as instructed. Your pain medication is not giving enough relief while using the spirometer. You develop fever of 100.5 F (38.1 C) or higher. SEEK IMMEDIATE MEDICAL CARE IF:  You cough up bloody sputum that had not been present before. You develop fever of 102 F (38.9 C) or greater. You develop worsening pain at or near the incision site. MAKE SURE YOU:  Understand these instructions. Will watch your condition. Will get help right away if you are not doing well or get worse. Document Released: 04/23/2007 Document Revised: 03/04/2012 Document Reviewed: 06/24/2007 [facility] Patient Information 2014 [place], [place].   ____________________________________________________________

## 2024-10-13 NOTE — Progress Notes (Signed)
 Second request for pre op orders sent message via [facility].

## 2024-10-14 ENCOUNTER — Ambulatory Visit
Admission: RE | Admit: 2024-10-14 | Discharge: 2024-10-14 | Disposition: A | Discharge status: Home / Self Care | Source: Ambulatory Visit | Attending: Family | Admitting: Family

## 2024-10-14 ENCOUNTER — Other Ambulatory Visit: Payer: Self-pay

## 2024-10-14 DIAGNOSIS — R6889 Other general symptoms and signs: Secondary | ICD-10-CM

## 2024-10-14 DIAGNOSIS — R412 Retrograde amnesia: Secondary | ICD-10-CM

## 2024-10-15 ENCOUNTER — Ambulatory Visit: Payer: Self-pay | Admitting: Student

## 2024-10-15 ENCOUNTER — Other Ambulatory Visit: Payer: Self-pay

## 2024-10-15 ENCOUNTER — Encounter (HOSPITAL_COMMUNITY)
Admission: RE | Admit: 2024-10-15 | Discharge: 2024-10-15 | Disposition: A | Discharge status: Home / Self Care | Source: Ambulatory Visit | Attending: Orthopedic Surgery | Admitting: Orthopedic Surgery

## 2024-10-15 ENCOUNTER — Encounter (HOSPITAL_COMMUNITY): Payer: Self-pay

## 2024-10-15 VITALS — BP 144/87 | HR 66 | Temp 98.2°F | Ht 70.0 in | Wt 268.0 lb

## 2024-10-15 DIAGNOSIS — Z01812 Encounter for preprocedural laboratory examination: Secondary | ICD-10-CM | POA: Diagnosis present

## 2024-10-15 DIAGNOSIS — Z01818 Encounter for other preprocedural examination: Secondary | ICD-10-CM

## 2024-10-15 DIAGNOSIS — I10 Essential (primary) hypertension: Secondary | ICD-10-CM | POA: Diagnosis not present

## 2024-10-15 HISTORY — DX: Prediabetes: R73.03

## 2024-10-15 HISTORY — DX: Anemia, unspecified: D64.9

## 2024-10-15 LAB — CBC
HCT: 42.9 % (ref 36.0–46.0)
Hemoglobin: 13.5 g/dL (ref 12.0–15.0)
MCH: 27.4 pg (ref 26.0–34.0)
MCHC: 31.5 g/dL (ref 30.0–36.0)
MCV: 87 fL (ref 80.0–100.0)
Platelets: 288 K/uL (ref 150–400)
RBC: 4.93 MIL/uL (ref 3.87–5.11)
RDW: 12.7 % (ref 11.5–15.5)
WBC: 8.6 K/uL (ref 4.0–10.5)
nRBC: 0 % (ref 0.0–0.2)

## 2024-10-15 LAB — SURGICAL PCR SCREEN
MRSA, PCR: NEGATIVE
Staphylococcus aureus: NEGATIVE

## 2024-10-15 NOTE — Progress Notes (Signed)
 For Anesthesia: PCP - Buck Search, PA-C  Cardiologist - Alvan Ronal [name]  Clearance: Robin Josefa CHRISTELLA, NP : 09/02/24 Bowel Prep reminder:N/A  Chest x-ray -  EKG - 02/26/24 Stress Test -  ECHO -  Cardiac Cath -  Pacemaker/ICD device last checked: Pacemaker orders received: Device Rep notified:  Spinal Cord Stimulator:N/A  Sleep Study - Yes CPAP - Yes  Fasting Blood Sugar - N/A Checks Blood Sugar _____ times a day Date and result of last Hgb A1c- 5.8: 09/23/24  Last dose of GLP1 agonist- Zepbound: On hold since: 10/08/24 GLP1 instructions: Hold 7 days prior to schedule (Hold 24 hours-daily)   Last dose of SGLT-2 inhibitors- N/A SGLT-2 instructions: Hold 72 hours prior to surgery  Blood Thinner Instructions:N/A Last Dose: Time last taken:  Aspirin  Instructions:N/A Last Dose: Time last taken:  Activity level: Can go up a flight of stairs and activities of daily living without stopping and without chest pain and/or shortness of breath   Able to exercise without chest pain and/or shortness of breath  Anesthesia review: Hx: HTN,OSA(CPAP),Pre-DIA.  Patient denies shortness of breath, fever, cough and chest pain at PAT appointment   Patient verbalized understanding of instructions that were reviewed over the telephone.

## 2024-10-15 NOTE — H&P (Signed)
 TOTAL KNEE ADMISSION H&P  Patient is being admitted for left total knee arthroplasty.  Subjective:  Chief Complaint:left knee pain.  HPI: Robin [name], 53 y.o. female, has a history of pain and functional disability in the left knee due to arthritis and has failed non-surgical conservative treatments for greater than 12 weeks to includeNSAID's and/or analgesics, corticosteriod injections, flexibility and strengthening excercises, supervised PT with diminished ADL's post treatment, use of assistive devices, weight reduction as appropriate, and activity modification.  Onset of symptoms was gradual, starting 10 years ago with rapidlly worsening course since that time. The patient noted no past surgery on the left knee(s).  Patient currently rates pain in the left knee(s) at 10 out of 10 with activity. Patient has night pain, worsening of pain with activity and weight bearing, pain that interferes with activities of daily living, pain with passive range of motion, crepitus, and joint swelling.  Patient has evidence of subchondral cysts, subchondral sclerosis, periarticular osteophytes, and joint space narrowing by imaging studies. There is no active infection.  Patient Active Problem List   Diagnosis Date Noted   Osteoarthritis of right knee 06/11/2024   S/P total knee arthroplasty, right 06/11/2024   Myofascial pain 02/04/2024   Recurrent major depressive disorder, in partial remission 11/01/2023   Status post gastric surgery 10/31/2023   Bilateral shoulder pain 04/25/2023   Dysphagia 04/17/2023   Gastroesophageal reflux disease without esophagitis 04/17/2023   Colon cancer screening 04/17/2023   Constipation 04/17/2023   Bilateral carpal tunnel syndrome 03/12/2023   Positive ANA (antinuclear antibody) 12/05/2022   Vitamin D deficiency 11/27/2022   High risk medication use 04/19/2022   Financial insecurity 02/13/2022   Anxiety and depression 01/30/2022   Hypercholesterolemia  01/30/2022   Primary hypertension 01/30/2022   Rheumatoid arthritis of multiple sites with negative rheumatoid factor (HCC) 01/30/2022   Osteoarthritis of left knee 06/01/2021   Lumbar spinal stenosis status post L2-L3 TLIF 05/05/2021   Degenerative scoliosis 04/18/2021   Sarcoidosis 05/10/2020   Pain of left hip joint 03/24/2020   Cervical spondylosis without myelopathy 07/16/2019   Neck pain 07/16/2019   Obesity, Class III, BMI 40-49.9 (morbid obesity) (HCC) 10/18/2018   Primary osteoarthritis of both knees 10/18/2018   Hammertoe of right foot 07/02/2018   OSA (obstructive sleep apnea) 10/26/2014   Past Medical History:  Diagnosis Date   Allergy    Anemia    Anxiety    Asthma    Bilateral carpal tunnel syndrome    Degeneration of lumbar intervertebral disc    Depression    GERD (gastroesophageal reflux disease)    Hyperlipidemia    Hypertension    OSA (obstructive sleep apnea)    has cpap   Osteoarthritis of both knees    Pneumonia    Pre-diabetes    Rheumatoid arthritis (HCC)    Sarcoidosis    Spinal stenosis    Vitamin D deficiency     Past Surgical History:  Procedure Laterality Date   ABDOMINAL HYSTERECTOMY     DEBRIDEMENT AND CLOSURE WOUND N/A 06/15/2021   Procedure: Irrigation and debridement and closure of spine wound;  Surgeon: Burnetta Aures, MD;  Location: [facility] OR;  Service: Orthopedics;  Laterality: N/A;   ESOPHAGOGASTRODUODENOSCOPY     FOOT SURGERY Bilateral    Both surgeries for bunions by Dr. Teresita per pt   gastric sleeve surgery  10/16/2023   KNEE ARTHROPLASTY Right 06/11/2024   Procedure: ARTHROPLASTY, KNEE, TOTAL, USING IMAGELESS COMPUTER-ASSISTED NAVIGATION;  Surgeon: Fidel Rogue, MD;  Location: [facility] ORS;  Service: Orthopedics;  Laterality: Right;   TRANSFORAMINAL LUMBAR INTERBODY FUSION (TLIF) WITH PEDICLE SCREW FIXATION 1 LEVEL N/A 05/05/2021   Procedure: TRANSFORAMINAL LUMBAR INTERBODY FUSION (TLIF) WITH PEDICLE SCREW FIXATION 1 LEVEL L2-3;   Surgeon: Burnetta Aures, MD;  Location: [facility] OR;  Service: Orthopedics;  Laterality: N/A;  4 hrs    Current Outpatient Medications  Medication Sig Dispense Refill Last Dose/Taking   Abatacept  (ORENCIA  CLICKJECT) 125 MG/ML SOAJ Inject 125 mg into the skin once a week. 12 mL 0    acetaminophen  (TYLENOL ) 650 MG CR tablet Take 1,300 mg by mouth every 8 (eight) hours as needed (pain).      albuterol  (VENTOLIN  HFA) 108 (90 Base) MCG/ACT inhaler Inhale 2 puffs into the lungs every 6 (six) hours as needed for wheezing or shortness of breath. 18 g 2    amLODipine  (NORVASC ) 10 MG tablet Take 1 tablet (10 mg total) by mouth daily. 30 tablet 11    atorvastatin  (LIPITOR) 20 MG tablet Take 1 tablet (20 mg total) by mouth daily. 30 tablet 11    BIOTIN PO Take 1 tablet by mouth daily.      buPROPion  (WELLBUTRIN  SR) 200 MG 12 hr tablet Take 100-200 mg by mouth See admin instructions. Take 200 mg by mouth in the morning and take 100 mg in the evening      CALCIUM  PO Take 1 tablet by mouth daily.      carvedilol  (COREG ) 12.5 MG tablet Take 1 tablet (12.5 mg total) by mouth 2 (two) times daily with a meal. 60 tablet 5    cetirizine (ZYRTEC) 10 MG tablet Take 10 mg by mouth daily as needed for allergies.      cyclobenzaprine  (FLEXERIL ) 10 MG tablet Take 10 mg by mouth at bedtime as needed.      diclofenac  Sodium (VOLTAREN ) 1 % GEL Apply 2 g topically daily as needed (pain).      Docusate Sodium  (DSS) 100 MG CAPS Take 100 mg by mouth daily as needed (Constipation).      escitalopram  (LEXAPRO ) 20 MG tablet Take 20 mg by mouth daily.      fluticasone  (FLONASE ) 50 MCG/ACT nasal spray Place 1 spray into both nostrils daily as needed for allergies.      HYDROcodone -acetaminophen  (NORCO) 10-325 MG tablet Take 1 tablet by mouth every 8 (eight) hours as needed.      losartan -hydrochlorothiazide  (HYZAAR) 100-25 MG tablet Take 1 tablet by mouth daily. 90 tablet 1    Melatonin 10 MG CAPS Take 20 mg by mouth at bedtime as needed  (sleep).      Multiple Vitamin (MULTIVITAMIN ADULT PO) Take 1 tablet by mouth daily.      omeprazole (PRILOSEC) 40 MG capsule Take 40 mg by mouth daily.      OVER THE COUNTER MEDICATION Take 1 tablet by mouth daily. Menopause Supplement      pregabalin  (LYRICA ) 75 MG capsule Take 1 capsule (75 mg total) by mouth 2 (two) times daily. 180 capsule 0    tirzepatide (ZEPBOUND) 2.5 MG/0.5ML injection vial Inject 2.5 mg into the skin once a week.      triamcinolone  lotion (KENALOG ) 0.1 % Apply 1 Application topically 3 (three) times daily.      No current facility-administered medications for this visit.   Allergies  Allergen Reactions   Lisinopril Cough    Social History   Tobacco Use   Smoking status: Former    Current packs/day: 0.00  Average packs/day: 0.1 packs/day for 2.0 years (0.2 ttl pk-yrs)    Types: Cigarettes    Start date: 12/25/1994    Quit date: 12/25/1996    Years since quitting: 27.8    Passive exposure: Never   Smokeless tobacco: Never   Tobacco comments:    smoked 2-3 cigs daily x 2 years  Substance Use Topics   Alcohol use: Yes    Alcohol/week: 0.0 standard drinks of alcohol    Comment: rarely    Family History  Problem Relation Age of Onset   Colon polyps Mother    Colon cancer Neg Hx    Stomach cancer Neg Hx    Esophageal cancer Neg Hx    Rectal cancer Neg Hx      Review of Systems  Musculoskeletal:  Positive for arthralgias, gait problem and joint swelling.  All other systems reviewed and are negative.   Objective:  Physical Exam Constitutional:      Appearance: Normal appearance.  HENT:     Head: Normocephalic and atraumatic.     Nose: Nose normal.     Mouth/Throat:     Mouth: Mucous membranes are moist.     Pharynx: Oropharynx is clear.  Eyes:     Conjunctiva/sclera: Conjunctivae normal.  Cardiovascular:     Rate and Rhythm: Normal rate and regular rhythm.     Pulses: Normal pulses.     Heart sounds: Normal heart sounds.  Pulmonary:      Effort: Pulmonary effort is normal.     Breath sounds: Normal breath sounds.  Abdominal:     General: Abdomen is flat.     Palpations: Abdomen is soft.  Genitourinary:    Comments: deferred Musculoskeletal:     Cervical back: Normal range of motion and neck supple.     Comments: Examination of the left knee reveals no skin wounds or lesions. She has swelling, trace effusion. No warmth or erythema. Varus deformity. Tenderness to palpation medial joint line, lateral joint line, peripatellar retinacular tissues with a positive grind sign. Range of motion 0 to 1 to 3 degrees without any ligamentous instability. Quad strength 4/5. No extensor lag. Painless range of motion of the hip.  Sensory and motor function intact in LE bilaterally. Distal pedal pulses 2+ bilaterally.  No significant pedal edema. Calves soft and non-tender.  Skin:    General: Skin is warm and dry.     Capillary Refill: Capillary refill takes less than 2 seconds.  Neurological:     General: No focal deficit present.     Mental Status: She is alert and oriented to person, place, and time.  Psychiatric:        Mood and Affect: Mood normal.        Behavior: Behavior normal.        Thought Content: Thought content normal.        Judgment: Judgment normal.     Vital signs in last 24 hours: @VSRANGES @  Labs:   Estimated body mass index is 38.45 kg/m as calculated from the following:   Height as of an earlier encounter on 10/15/24: 5' 10 (1.778 m).   Weight as of an earlier encounter on 10/15/24: 121.6 kg.   Imaging Review Plain radiographs demonstrate severe degenerative joint disease of the left knee(s). The overall alignment issignificant varus. The bone quality appears to be adequate for age and reported activity level.      Assessment/Plan:  End stage arthritis, left knee   The patient history, physical  examination, clinical judgment of the provider and imaging studies are consistent with end stage  degenerative joint disease of the left knee(s) and total knee arthroplasty is deemed medically necessary. The treatment options including medical management, injection therapy arthroscopy and arthroplasty were discussed at length. The risks and benefits of total knee arthroplasty were presented and reviewed. The risks due to aseptic loosening, infection, stiffness, patella tracking problems, thromboembolic complications and other imponderables were discussed. The patient acknowledged the explanation, agreed to proceed with the plan and consent was signed. Patient is being admitted for inpatient treatment for surgery, pain control, PT, OT, prophylactic antibiotics, VTE prophylaxis, progressive ambulation and ADL's and discharge planning. The patient is planning to be discharged home with OPPT after an overnight stay.   Therapy Plans: outpatient therapy PT [facility] 10/27/24.  Disposition: Home with husband Abou.  Planned DVT Prophylaxis: aspirin  81mg  BID.  DME needed: Has rolling walker and ice machine.  PCP: Cleared.  Cardiology: Cleared.  Rheumatology: Cleared.  TXA: IV Allergies:  - Lisinopril - cough.  Anesthesia Concerns: Has woken up during surgery years ago. No other known issues. Had to do general last time.  BMI: 39.9.  Last HgbA1c: 5.4.  Other: - OSA, uses CPAP.  - RA history, orencia  injection, weekly. Last injection 05/14/24, continue to hold prior to surgery. Will continue to hold until incision healed postop.  - Staples.  - Pain management, Dr. Bonner. Hydrocodone  10/325 4x/day. Flexeril  PRN at night. Spinal stenosis. - Gastric sleeve. No NSAIDs. - Hold zepbound, do not have Thursday injection, hold until after surgery.  - Hydrocodone  10mg  q4hours, zofran , methocarbamol .  - 10/15/24: Hgb 13.5, 09/23/24: K+ 3.5, Cr. 0.75.  - [facility].     Patient's anticipated LOS is less than 2 midnights, meeting these requirements: - Younger than 27 - Lives within 1 hour  of care - Has a competent adult at home to recover with post-op recover - NO history of  - Chronic pain requiring opiods  - Diabetes  - Coronary Artery Disease  - Heart failure  - Heart attack  - Stroke  - DVT/VTE  - Cardiac arrhythmia  - Respiratory Failure/COPD  - Renal failure  - Anemia  - Advanced Liver disease

## 2024-10-15 NOTE — H&P (View-Only) (Signed)
 TOTAL KNEE ADMISSION H&P  Patient is being admitted for left total knee arthroplasty.  Subjective:  Chief Complaint:left knee pain.  HPI: Robin [name], 53 y.o. female, has a history of pain and functional disability in the left knee due to arthritis and has failed non-surgical conservative treatments for greater than 12 weeks to includeNSAID's and/or analgesics, corticosteriod injections, flexibility and strengthening excercises, supervised PT with diminished ADL's post treatment, use of assistive devices, weight reduction as appropriate, and activity modification.  Onset of symptoms was gradual, starting 10 years ago with rapidlly worsening course since that time. The patient noted no past surgery on the left knee(s).  Patient currently rates pain in the left knee(s) at 10 out of 10 with activity. Patient has night pain, worsening of pain with activity and weight bearing, pain that interferes with activities of daily living, pain with passive range of motion, crepitus, and joint swelling.  Patient has evidence of subchondral cysts, subchondral sclerosis, periarticular osteophytes, and joint space narrowing by imaging studies. There is no active infection.  Patient Active Problem List   Diagnosis Date Noted   Osteoarthritis of right knee 06/11/2024   S/P total knee arthroplasty, right 06/11/2024   Myofascial pain 02/04/2024   Recurrent major depressive disorder, in partial remission 11/01/2023   Status post gastric surgery 10/31/2023   Bilateral shoulder pain 04/25/2023   Dysphagia 04/17/2023   Gastroesophageal reflux disease without esophagitis 04/17/2023   Colon cancer screening 04/17/2023   Constipation 04/17/2023   Bilateral carpal tunnel syndrome 03/12/2023   Positive ANA (antinuclear antibody) 12/05/2022   Vitamin D deficiency 11/27/2022   High risk medication use 04/19/2022   Financial insecurity 02/13/2022   Anxiety and depression 01/30/2022   Hypercholesterolemia  01/30/2022   Primary hypertension 01/30/2022   Rheumatoid arthritis of multiple sites with negative rheumatoid factor (HCC) 01/30/2022   Osteoarthritis of left knee 06/01/2021   Lumbar spinal stenosis status post L2-L3 TLIF 05/05/2021   Degenerative scoliosis 04/18/2021   Sarcoidosis 05/10/2020   Pain of left hip joint 03/24/2020   Cervical spondylosis without myelopathy 07/16/2019   Neck pain 07/16/2019   Obesity, Class III, BMI 40-49.9 (morbid obesity) (HCC) 10/18/2018   Primary osteoarthritis of both knees 10/18/2018   Hammertoe of right foot 07/02/2018   OSA (obstructive sleep apnea) 10/26/2014   Past Medical History:  Diagnosis Date   Allergy    Anemia    Anxiety    Asthma    Bilateral carpal tunnel syndrome    Degeneration of lumbar intervertebral disc    Depression    GERD (gastroesophageal reflux disease)    Hyperlipidemia    Hypertension    OSA (obstructive sleep apnea)    has cpap   Osteoarthritis of both knees    Pneumonia    Pre-diabetes    Rheumatoid arthritis (HCC)    Sarcoidosis    Spinal stenosis    Vitamin D deficiency     Past Surgical History:  Procedure Laterality Date   ABDOMINAL HYSTERECTOMY     DEBRIDEMENT AND CLOSURE WOUND N/A 06/15/2021   Procedure: Irrigation and debridement and closure of spine wound;  Surgeon: Burnetta Aures, MD;  Location: [facility] OR;  Service: Orthopedics;  Laterality: N/A;   ESOPHAGOGASTRODUODENOSCOPY     FOOT SURGERY Bilateral    Both surgeries for bunions by Dr. Teresita per pt   gastric sleeve surgery  10/16/2023   KNEE ARTHROPLASTY Right 06/11/2024   Procedure: ARTHROPLASTY, KNEE, TOTAL, USING IMAGELESS COMPUTER-ASSISTED NAVIGATION;  Surgeon: Fidel Rogue, MD;  Location: [facility];  Service: Orthopedics;  Laterality: Right;   TRANSFORAMINAL LUMBAR INTERBODY FUSION (TLIF) WITH PEDICLE SCREW FIXATION 1 LEVEL N/A 05/05/2021   Procedure: TRANSFORAMINAL LUMBAR INTERBODY FUSION (TLIF) WITH PEDICLE SCREW FIXATION 1 LEVEL L2-3;   Surgeon: Burnetta Aures, MD;  Location: [facility] OR;  Service: Orthopedics;  Laterality: N/A;  4 hrs    Current Outpatient Medications  Medication Sig Dispense Refill Last Dose/Taking   Abatacept  (ORENCIA  CLICKJECT) 125 MG/ML SOAJ Inject 125 mg into the skin once a week. 12 mL 0    acetaminophen  (TYLENOL ) 650 MG CR tablet Take 1,300 mg by mouth every 8 (eight) hours as needed (pain).      albuterol  (VENTOLIN  HFA) 108 (90 Base) MCG/ACT inhaler Inhale 2 puffs into the lungs every 6 (six) hours as needed for wheezing or shortness of breath. 18 g 2    amLODipine  (NORVASC ) 10 MG tablet Take 1 tablet (10 mg total) by mouth daily. 30 tablet 11    atorvastatin  (LIPITOR) 20 MG tablet Take 1 tablet (20 mg total) by mouth daily. 30 tablet 11    BIOTIN PO Take 1 tablet by mouth daily.      buPROPion  (WELLBUTRIN  SR) 200 MG 12 hr tablet Take 100-200 mg by mouth See admin instructions. Take 200 mg by mouth in the morning and take 100 mg in the evening      CALCIUM  PO Take 1 tablet by mouth daily.      carvedilol  (COREG ) 12.5 MG tablet Take 1 tablet (12.5 mg total) by mouth 2 (two) times daily with a meal. 60 tablet 5    cetirizine (ZYRTEC) 10 MG tablet Take 10 mg by mouth daily as needed for allergies.      cyclobenzaprine  (FLEXERIL ) 10 MG tablet Take 10 mg by mouth at bedtime as needed.      diclofenac  Sodium (VOLTAREN ) 1 % GEL Apply 2 g topically daily as needed (pain).      Docusate Sodium  (DSS) 100 MG CAPS Take 100 mg by mouth daily as needed (Constipation).      escitalopram  (LEXAPRO ) 20 MG tablet Take 20 mg by mouth daily.      fluticasone  (FLONASE ) 50 MCG/ACT nasal spray Place 1 spray into both nostrils daily as needed for allergies.      HYDROcodone -acetaminophen  (NORCO) 10-325 MG tablet Take 1 tablet by mouth every 8 (eight) hours as needed.      losartan -hydrochlorothiazide  (HYZAAR) 100-25 MG tablet Take 1 tablet by mouth daily. 90 tablet 1    Melatonin 10 MG CAPS Take 20 mg by mouth at bedtime as needed  (sleep).      Multiple Vitamin (MULTIVITAMIN ADULT PO) Take 1 tablet by mouth daily.      omeprazole (PRILOSEC) 40 MG capsule Take 40 mg by mouth daily.      OVER THE COUNTER MEDICATION Take 1 tablet by mouth daily. Menopause Supplement      pregabalin  (LYRICA ) 75 MG capsule Take 1 capsule (75 mg total) by mouth 2 (two) times daily. 180 capsule 0    tirzepatide (ZEPBOUND) 2.5 MG/0.5ML injection vial Inject 2.5 mg into the skin once a week.      triamcinolone  lotion (KENALOG ) 0.1 % Apply 1 Application topically 3 (three) times daily.      No current facility-administered medications for this visit.   Allergies  Allergen Reactions   Lisinopril Cough    Social History   Tobacco Use   Smoking status: Former    Current packs/day: 0.00  Average packs/day: 0.1 packs/day for 2.0 years (0.2 ttl pk-yrs)    Types: Cigarettes    Start date: 12/25/1994    Quit date: 12/25/1996    Years since quitting: 27.8    Passive exposure: Never   Smokeless tobacco: Never   Tobacco comments:    smoked 2-3 cigs daily x 2 years  Substance Use Topics   Alcohol use: Yes    Alcohol/week: 0.0 standard drinks of alcohol    Comment: rarely    Family History  Problem Relation Age of Onset   Colon polyps Mother    Colon cancer Neg Hx    Stomach cancer Neg Hx    Esophageal cancer Neg Hx    Rectal cancer Neg Hx      Review of Systems  Musculoskeletal:  Positive for arthralgias, gait problem and joint swelling.  All other systems reviewed and are negative.   Objective:  Physical Exam Constitutional:      Appearance: Normal appearance.  HENT:     Head: Normocephalic and atraumatic.     Nose: Nose normal.     Mouth/Throat:     Mouth: Mucous membranes are moist.     Pharynx: Oropharynx is clear.  Eyes:     Conjunctiva/sclera: Conjunctivae normal.  Cardiovascular:     Rate and Rhythm: Normal rate and regular rhythm.     Pulses: Normal pulses.     Heart sounds: Normal heart sounds.  Pulmonary:      Effort: Pulmonary effort is normal.     Breath sounds: Normal breath sounds.  Abdominal:     General: Abdomen is flat.     Palpations: Abdomen is soft.  Genitourinary:    Comments: deferred Musculoskeletal:     Cervical back: Normal range of motion and neck supple.     Comments: Examination of the left knee reveals no skin wounds or lesions. She has swelling, trace effusion. No warmth or erythema. Varus deformity. Tenderness to palpation medial joint line, lateral joint line, peripatellar retinacular tissues with a positive grind sign. Range of motion 0 to 1 to 3 degrees without any ligamentous instability. Quad strength 4/5. No extensor lag. Painless range of motion of the hip.  Sensory and motor function intact in LE bilaterally. Distal pedal pulses 2+ bilaterally.  No significant pedal edema. Calves soft and non-tender.  Skin:    General: Skin is warm and dry.     Capillary Refill: Capillary refill takes less than 2 seconds.  Neurological:     General: No focal deficit present.     Mental Status: She is alert and oriented to person, place, and time.  Psychiatric:        Mood and Affect: Mood normal.        Behavior: Behavior normal.        Thought Content: Thought content normal.        Judgment: Judgment normal.     Vital signs in last 24 hours: @VSRANGES @  Labs:   Estimated body mass index is 38.45 kg/m as calculated from the following:   Height as of an earlier encounter on 10/15/24: 5' 10 (1.778 m).   Weight as of an earlier encounter on 10/15/24: 121.6 kg.   Imaging Review Plain radiographs demonstrate severe degenerative joint disease of the left knee(s). The overall alignment issignificant varus. The bone quality appears to be adequate for age and reported activity level.      Assessment/Plan:  End stage arthritis, left knee   The patient history, physical  examination, clinical judgment of the provider and imaging studies are consistent with end stage  degenerative joint disease of the left knee(s) and total knee arthroplasty is deemed medically necessary. The treatment options including medical management, injection therapy arthroscopy and arthroplasty were discussed at length. The risks and benefits of total knee arthroplasty were presented and reviewed. The risks due to aseptic loosening, infection, stiffness, patella tracking problems, thromboembolic complications and other imponderables were discussed. The patient acknowledged the explanation, agreed to proceed with the plan and consent was signed. Patient is being admitted for inpatient treatment for surgery, pain control, PT, OT, prophylactic antibiotics, VTE prophylaxis, progressive ambulation and ADL's and discharge planning. The patient is planning to be discharged home with OPPT after an overnight stay.   Therapy Plans: outpatient therapy PT [facility] 10/27/24.  Disposition: Home with husband Abou.  Planned DVT Prophylaxis: aspirin  81mg  BID.  DME needed: Has rolling walker and ice machine.  PCP: Cleared.  Cardiology: Cleared.  Rheumatology: Cleared.  TXA: IV Allergies:  - Lisinopril - cough.  Anesthesia Concerns: Has woken up during surgery years ago. No other known issues. Had to do general last time.  BMI: 39.9.  Last HgbA1c: 5.4.  Other: - OSA, uses CPAP.  - RA history, orencia  injection, weekly. Last injection 05/14/24, continue to hold prior to surgery. Will continue to hold until incision healed postop.  - Staples.  - Pain management, Dr. Bonner. Hydrocodone  10/325 4x/day. Flexeril  PRN at night. Spinal stenosis. - Gastric sleeve. No NSAIDs. - Hold zepbound, do not have Thursday injection, hold until after surgery.  - Hydrocodone  10mg  q4hours, zofran , methocarbamol .  - 10/15/24: Hgb 13.5, 09/23/24: K+ 3.5, Cr. 0.75.  - [facility].     Patient's anticipated LOS is less than 2 midnights, meeting these requirements: - Younger than 27 - Lives within 1 hour  of care - Has a competent adult at home to recover with post-op recover - NO history of  - Chronic pain requiring opiods  - Diabetes  - Coronary Artery Disease  - Heart failure  - Heart attack  - Stroke  - DVT/VTE  - Cardiac arrhythmia  - Respiratory Failure/COPD  - Renal failure  - Anemia  - Advanced Liver disease

## 2024-10-16 ENCOUNTER — Other Ambulatory Visit: Payer: Self-pay

## 2024-10-16 NOTE — Progress Notes (Signed)
 Specialty Pharmacy Ongoing Clinical Assessment Note  Robin [name] is a 53 y.o. female who is being followed by the specialty pharmacy service for RxSp Rheumatoid Arthritis   Patient's specialty medication(s) reviewed today: Abatacept  (Orencia  ClickJect)   Missed doses in the last 4 weeks: 0 (No recent missed doses; however, medication will be on hold for at least a month following an upcoming knee replacement.)   Patient/Caregiver did not have any additional questions or concerns.   Therapeutic benefit summary: Patient is achieving benefit   Adverse events/side effects summary: No adverse events/side effects   Patient's therapy is appropriate to: Continue    Goals Addressed             This Visit's Progress    Minimize recurrence of flares   On track    Patient is on track. Patient will maintain adherence         Follow up: 12 months  Silvano LOISE Dolly Specialty Pharmacist

## 2024-10-16 NOTE — Progress Notes (Deleted)
 Office Visit Note  Patient: Robin [name]             Date of Birth: [birth year]           MRN: [id number]             PCP: Buck Search, PA-C Referring: Buck Search, PA-C Visit Date: 10/29/2024   Subjective:  No chief complaint on file.   History of Present Illness: Robin [name] is a 53 y.o. female here for follow up for seronegative RA and OA now on Orencia  125 mg subcu weekly and lyrica  75 mg BID.   Previous HPI 07/29/2024 Robin [name] is a 53 y.o. female here for follow up for seronegative RA and OA now on Orencia  125 mg subcu weekly and lyrica  75 mg BID.     She underwent right knee replacement surgery and her knee is doing well post-operatively. She completed physical therapy and is now managing independently. She is scheduled to see her doctor soon and hopes to resume her Orencia  medication, which was paused due to an open wound post-surgery.   Since discontinuing Orencia , she has been experiencing joint pain, muscle pain, and bone pain. She is eager to restart Orencia  to alleviate these symptoms.   She is currently taking Lyrica  at a dose of 75 mg twice a day. She initially increased the dose but experienced swelling in her foot, so she adjusted to one pill in the morning and one at night. The lower dose has been effective in managing her symptoms without causing significant side effects.   She reports ongoing issues with carpal tunnel syndrome, which affects both hands, particularly at night. Additionally, she has a cyst on one finger joint and a bony nodule on her thumb, which are painful when bumped.   No recent illnesses or antibiotic use since her last visit. Her recent blood work was completed in July.         Previous HPI 04/28/2024 Robin [name] is a 53 y.o. female here for follow up for seronegative RA and OA now on Orencia  125 mg subcu weekly and lyrica  75 mg BID.     She has experienced increased pain since discontinuing her  medication in March due to a change in [place] from [place] to [employer]. The pain has slightly increased since being off her medication for over a month.   She was previously on Orencia  and Humira  for rheumatoid arthritis. She is considering switching to an infusion treatment for Orencia  due to cost concerns with her current Medicare plan. She has not had any medication since March and is experiencing more pain as a result.   She is currently taking Lyrica  75 mg, which she finds very helpful, allowing her to 'get around, go places, and do things.' Lyrica  works better than gabapentin , which caused a 'funny feeling' in her head. She is interested in increasing the Lyrica  dose as she is tolerating it well without significant side effects, except for some swelling in one leg, which she attributes to previous surgery on her toes.   She also experiences significant pain from carpal tunnel syndrome, which she manages with wrist braces worn at night and sometimes during the day. She has had a painful but effective injection in the past for this condition. Her knees are also problematic, with bone spurs causing pain, and she was scheduled for knee replacement surgery before her insurance changed. She finds relief from water exercises, which are low impact and beneficial for  her condition.   She takes cyclobenzaprine  at night as needed, which she finds helpful. She is working on making progress after surgery last year and is cautious about using steroids. No new leg swelling on Lyrica , except for one leg which she attributes to past surgery. Significant pain in her shoulders and hands, particularly from carpal tunnel syndrome. No other side effects from her current medications.         Previous HPI 02/04/2024 Robin [name] is a 53 y.o. female here for follow up for seronegative RA now on Orencia  125 mg subcu weekly and OA..     She experiences widespread pain, particularly in her back,  shoulders, elbows, and hands, with radiation up her arms and legs. The pain is sharp, constant, and exacerbated by cold weather, causing her body to 'lock up' when stepping outside. She reports continuous and severe pain down to her bones and muscles.   She has bilateral carpal tunnel syndrome contributing to hand pain, with swelling described as 'like a mitten' and occasional knee swelling after walking. She experiences tingling and a 'falling asleep sensation' in her hands at night, despite wearing wrist braces. No specific treatments for carpal tunnel syndrome have been tried.   She has difficulty sleeping due to pain, despite using 'sleepy time tea,' and sleeps no more than four hours a night in short intervals. Various medications have been tried for pain, including gabapentin , which was discontinued due to side effects, and Lyrica , stopped due to insurance changes. She is currently taking Wellbutrin  for depression and anxiety.   She has been attempting to exercise at the [facility] but finds the pain overwhelming. Prednisone  provides some relief, though not sufficient. She previously used hydrocodone  for back pain but stopped due to withdrawal symptoms.   She is scheduled to see her orthopedic specialist for a knee replacement consultation, as her weight has decreased sufficiently for the procedure.     Previous HPI 11/01/2023 Robin [name] is a 53 y.o. female here for follow up for seronegative RA now on Orencia  125 mg subcu weekly.  She is currently holding her medication due to going for laparoscopic gastrectomy on October 22.  She is asking today whether she can resume her medication.  She did notice within 1 to 2 weeks of skipping Orencia  starting to have increased joint pain especially in her right hand.  Also reports increased right shoulder pain that is most severe at night and with walking using a cane.  Surgery was uneventful she has been progressing with her diet.  One of the portals  took longer than others to heal but has now scabbed over since 2 or 3 days ago.  Still has more symptoms from bilateral knee pain with weightbearing limits her to walking for only a few minutes at a time and uses cane for offloading.   07/30/2023 Robin [name] is a 53 y.o. female here for follow up for seronegative RA now on Orencia  125 mg subcu weekly.  She not had any major flareup with joint pain or swelling.  Is noticing some ongoing swelling worst around the base of the thumb on left and right hand occasionally involving the second finger as well.  She saw her [facility] provider for pain in both shoulders and across the upper back.  Had x-rays for this and had a left shoulder steroid injection with Dr. Kay last week.  So far this is beneficial discussed plan to try on the right side for this in a  week or 2 if it is continuing to help.  She is walking with cane for assistance for bilateral knee pain this sometimes causes her right elbow to hurt a little bit.   04/25/2023 Robin [name] is a 53 y.o. female here for follow up for seronegative RA now on Orencia  125 mg subcu weekly.  She started with first dose of medication at pharmacy clinic appointment on March 6.  Has not experienced any difficulty with the medication or injection site reactions.  Labs checked from April 11 looks fine no problems with new medication start.  So far feels pretty good with this though she still has significant ongoing joint pain.  Worst complaint today in her shoulders and at the right elbow.   02/19/23 Robin [name] is a 53 y.o. female here for follow up for seronegative RA on Humira  40 mg subcu q. 14 days.  We just met last month discussed possibly switching to Actemra due to decreased Humira  efficacy.  However after reviewing side effect risks in more detail she is very concerned about this medication and was to look at other options.  Including possible side effects affecting her blood pressure or  pancreatitis or liver function.   01/10/23 Robin [name] is a 53 y.o. female here for follow up for seronegative RA on Humira  40 mg [place] q14days. Since our last visit she has a lot of ongoing joint pain her worst affected joints are in both knees where she has known severe osteoarthritis.  She is planned for completing series of visco supplementation injections. This is ongoing as a temporizing measure she is not currently candidate for joint replacement surgery due to morbid obesity. She is seeing weight loss clinic for this.  Besides that she still having some ongoing joint pain and swelling in her upper extremities and increase in facial rashes.   12/05/22 Robin [name] is a 53 y.o. female here for follow up for seronegative RA on Humira  40 mg [place] q14days. She had updated labs checked in family medicine clinic November with negative RA serology and normal inflammatory markers. Some improvement in swelling with Humira  but continues to have a lot of pain on a daily basis, worst in knees with known OA also in both hands, right elbow, left shoulder worse. She had injections to her knees with only a few days of benefit. On discussion of lab findings and notes reviewed, patient reports previously thought to have sarcoid with abnormal CXR and eye inflammation problems but was not on disease specific treatment at that time. She also had abnormal labs concerning for lupus for years when younger does not recall the exact details.     Labs reviewed 10/26/22 ANA pos RF neg CCP neg ESR wnl CRP wnl Vit D 1,25 wnl   Previous HPI 04/18/22 Robin LYNNMARIE LOVETT is a 53 y.o. female here for seronegative RA, previously a patient with Dr. Curt. She was taking Humira  previously interrupted due to loss of coverage and leflunomide with questionable response in symptoms. She was diagnosed after visit in 11/2019 with multiple areas of joint pain and frequent swelling particularly in bilateral wrists.   Laboratory work-up at that time including rheumatoid factor, CCP, ACE, HLA-B27, sed rate, and CRP were all negative.  She was started on methotrexate and the leflunomide with lack of response in her symptoms.  She started on Humira  for this and took it for a few months but was then off the medication for at least about a year  due to change in insurance status after losing her job.  She restarted this since 2 months ago so far has not seen a very impressive change in her symptoms.  She is significantly limited by severe lower back pain with degenerative arthritis pursuing disability for this.  Also has advanced osteoarthritis of bilateral knees.  Shoulder pain limits overhead movement or worsened with pressure.  Gets chronic hand pain worse around the thumbs but especially on the radial side of her wrist with fairly chronic swelling currently worse than normal on the right side.  She has had local steroid joint injections in multiple areas but none in the past year these were typically beneficial for at least weeks in the past.  For her osteoarthritis pain she takes diclofenac  twice daily with a pretty good benefit has not experienced any side effects or problems taking this medicine.    DMARD Hx Orencia  - current Humira  - loss of response MTX and LEF - inadequate response   No Rheumatology ROS completed.   PMFS History:  Patient Active Problem List   Diagnosis Date Noted   Osteoarthritis of right knee 06/11/2024   S/P total knee arthroplasty, right 06/11/2024   Myofascial pain 02/04/2024   Recurrent major depressive disorder, in partial remission 11/01/2023   Status post gastric surgery 10/31/2023   Bilateral shoulder pain 04/25/2023   Dysphagia 04/17/2023   Gastroesophageal reflux disease without esophagitis 04/17/2023   Colon cancer screening 04/17/2023   Constipation 04/17/2023   Bilateral carpal tunnel syndrome 03/12/2023   Positive ANA (antinuclear antibody) 12/05/2022   Vitamin D  deficiency 11/27/2022   High risk medication use 04/19/2022   Financial insecurity 02/13/2022   Anxiety and depression 01/30/2022   Hypercholesterolemia 01/30/2022   Primary hypertension 01/30/2022   Rheumatoid arthritis of multiple sites with negative rheumatoid factor (HCC) 01/30/2022   Osteoarthritis of left knee 06/01/2021   Lumbar spinal stenosis status post L2-L3 TLIF 05/05/2021   Degenerative scoliosis 04/18/2021   Sarcoidosis 05/10/2020   Pain of left hip joint 03/24/2020   Cervical spondylosis without myelopathy 07/16/2019   Neck pain 07/16/2019   Obesity, Class III, BMI 40-49.9 (morbid obesity) (HCC) 10/18/2018   Primary osteoarthritis of both knees 10/18/2018   Hammertoe of right foot 07/02/2018   OSA (obstructive sleep apnea) 10/26/2014    Past Medical History:  Diagnosis Date   Allergy    Anemia    Anxiety    Asthma    Bilateral carpal tunnel syndrome    Degeneration of lumbar intervertebral disc    Depression    GERD (gastroesophageal reflux disease)    Hyperlipidemia    Hypertension    OSA (obstructive sleep apnea)    has cpap   Osteoarthritis of both knees    Pneumonia    Pre-diabetes    Rheumatoid arthritis (HCC)    Sarcoidosis    Spinal stenosis    Vitamin D deficiency     Family History  Problem Relation Age of Onset   Colon polyps Mother    Colon cancer Neg Hx    Stomach cancer Neg Hx    Esophageal cancer Neg Hx    Rectal cancer Neg Hx    Past Surgical History:  Procedure Laterality Date   ABDOMINAL HYSTERECTOMY     DEBRIDEMENT AND CLOSURE WOUND N/A 06/15/2021   Procedure: Irrigation and debridement and closure of spine wound;  Surgeon: Burnetta Aures, MD;  Location: [facility] OR;  Service: Orthopedics;  Laterality: N/A;   ESOPHAGOGASTRODUODENOSCOPY  FOOT SURGERY Bilateral    Both surgeries for bunions by Dr. Teresita per pt   gastric sleeve surgery  10/16/2023   KNEE ARTHROPLASTY Right 06/11/2024   Procedure: ARTHROPLASTY, KNEE, TOTAL, USING  IMAGELESS COMPUTER-ASSISTED NAVIGATION;  Surgeon: Fidel Rogue, MD;  Location: [facility] ORS;  Service: Orthopedics;  Laterality: Right;   TRANSFORAMINAL LUMBAR INTERBODY FUSION (TLIF) WITH PEDICLE SCREW FIXATION 1 LEVEL N/A 05/05/2021   Procedure: TRANSFORAMINAL LUMBAR INTERBODY FUSION (TLIF) WITH PEDICLE SCREW FIXATION 1 LEVEL L2-3;  Surgeon: Burnetta Aures, MD;  Location: [facility] OR;  Service: Orthopedics;  Laterality: N/A;  4 hrs   Social History   Social History Narrative   Not on file   Immunization History  Administered Date(s) Administered   Influenza,inj,quad, With Preservative 09/25/2019   Influenza-Unspecified 10/20/2017   MODERNA COVID-19 SARS-COV-2 PEDS BIVALENT BOOSTER 52yr-60yr 01/07/2020, 02/04/2020   Unspecified SARS-COV-2 Vaccination 01/07/2020, 02/04/2020     Objective: Vital Signs: There were no vitals taken for this visit.   Physical Exam   Musculoskeletal Exam: ***  CDAI Exam: CDAI Score: -- Patient Global: --; Provider Global: -- Swollen: --; Tender: -- Joint Exam 10/29/2024   No joint exam has been documented for this visit   There is currently no information documented on the homunculus. Go to the Rheumatology activity and complete the homunculus joint exam.  Investigation: No additional findings.  Imaging: CT HEAD WO CONTRAST ( ) Result Date: 10/16/2024 EXAM: CT HEAD WITHOUT CONTRAST 10/14/2024 08:06:01 AM TECHNIQUE: CT of the head was performed without the administration of intravenous contrast. Automated exposure control, iterative reconstruction, and/or weight based adjustment of the mA/kV was utilized to reduce the radiation dose to as low as reasonably achievable. COMPARISON: None available. CLINICAL HISTORY: Forgetfulness, Retrograde amnesia; Symptoms approximately 2 years; Patient denies head pain. FINDINGS: BRAIN AND VENTRICLES: No acute hemorrhage. No evidence of acute infarct. No hydrocephalus. No extra-axial collection. No mass effect or midline  shift. Partially empty sella. ORBITS: No acute abnormality. SINUSES: No acute abnormality. SOFT TISSUES AND SKULL: No acute soft tissue abnormality. No skull fracture. IMPRESSION: 1. No acute intracranial abnormality. Electronically signed by: Franky Stanford MD 10/16/2024 03:30 AM EDT RP Workstation: [id number]    Recent Labs: Lab Results  Component Value Date   WBC 8.6 10/15/2024   HGB 13.5 10/15/2024   PLT 288 10/15/2024   NA 142 09/23/2024   K 3.5 09/23/2024   CL 105 09/23/2024   CO2 31 09/23/2024   GLUCOSE 93 09/23/2024   BUN 15 09/23/2024   CREATININE 0.75 09/23/2024   BILITOT 0.4 09/23/2024   ALKPHOS 103 09/23/2024   AST 22 09/23/2024   ALT 19 09/23/2024   PROT 6.9 09/23/2024   ALBUMIN  3.9 09/23/2024   CALCIUM  9.4 09/23/2024   GFRAA  10/12/2009    >60        The eGFR has been calculated using the MDRD equation. This calculation has not been validated in all clinical situations. eGFR's persistently <60 mL/min signify possible Chronic Kidney Disease.   QFTBGOLDPLUS NEGATIVE 04/28/2024    Speciality Comments: No specialty comments available.  Procedures:  No procedures performed Allergies: Lisinopril   Assessment / Plan:     Visit Diagnoses: No diagnosis found.  ***  Orders: No orders of the defined types were placed in this encounter.  No orders of the defined types were placed in this encounter.    Follow-Up Instructions: No follow-ups on file.   Vasti Yagi M Tilly Pernice, CMA  Note - This record has been created using Animal nutritionist.  Chart creation errors have been sought, but may not always  have been located. Such creation errors do not reflect on  the standard of medical care.

## 2024-10-22 ENCOUNTER — Encounter (HOSPITAL_COMMUNITY): Payer: Self-pay | Admitting: Orthopedic Surgery

## 2024-10-22 ENCOUNTER — Ambulatory Visit (HOSPITAL_COMMUNITY): Payer: Self-pay | Admitting: Physician Assistant

## 2024-10-22 ENCOUNTER — Ambulatory Visit (HOSPITAL_COMMUNITY)
Admission: RE | Admit: 2024-10-22 | Discharge: 2024-10-23 | Disposition: A | Discharge status: Home / Self Care | Source: Ambulatory Visit | Attending: Orthopedic Surgery | Admitting: Orthopedic Surgery

## 2024-10-22 ENCOUNTER — Ambulatory Visit (HOSPITAL_COMMUNITY): Admitting: Anesthesiology

## 2024-10-22 ENCOUNTER — Other Ambulatory Visit: Payer: Self-pay

## 2024-10-22 ENCOUNTER — Encounter (HOSPITAL_COMMUNITY)
Admission: RE | Disposition: A | Discharge status: Home / Self Care | Payer: Self-pay | Source: Ambulatory Visit | Attending: Orthopedic Surgery

## 2024-10-22 ENCOUNTER — Ambulatory Visit (HOSPITAL_COMMUNITY)

## 2024-10-22 DIAGNOSIS — K219 Gastro-esophageal reflux disease without esophagitis: Secondary | ICD-10-CM | POA: Insufficient documentation

## 2024-10-22 DIAGNOSIS — G4733 Obstructive sleep apnea (adult) (pediatric): Secondary | ICD-10-CM | POA: Diagnosis not present

## 2024-10-22 DIAGNOSIS — J45909 Unspecified asthma, uncomplicated: Secondary | ICD-10-CM | POA: Diagnosis not present

## 2024-10-22 DIAGNOSIS — M1712 Unilateral primary osteoarthritis, left knee: Secondary | ICD-10-CM | POA: Insufficient documentation

## 2024-10-22 DIAGNOSIS — Z79899 Other long term (current) drug therapy: Secondary | ICD-10-CM | POA: Insufficient documentation

## 2024-10-22 DIAGNOSIS — Z87891 Personal history of nicotine dependence: Secondary | ICD-10-CM | POA: Diagnosis not present

## 2024-10-22 DIAGNOSIS — I10 Essential (primary) hypertension: Secondary | ICD-10-CM | POA: Insufficient documentation

## 2024-10-22 DIAGNOSIS — Z96652 Presence of left artificial knee joint: Secondary | ICD-10-CM

## 2024-10-22 HISTORY — PX: KNEE ARTHROPLASTY: SHX992

## 2024-10-22 SURGERY — ARTHROPLASTY, KNEE, TOTAL, USING IMAGELESS COMPUTER-ASSISTED NAVIGATION
Anesthesia: General | Site: Knee | Laterality: Left

## 2024-10-22 MED ORDER — SODIUM CHLORIDE 0.9 % IR SOLN
Status: DC | PRN
  Administered 2024-10-22: 3000 mL

## 2024-10-22 MED ORDER — STERILE WATER FOR IRRIGATION IR SOLN
Status: DC | PRN
  Administered 2024-10-22: 2000 mL

## 2024-10-22 MED ORDER — BUPROPION HCL ER (SR) 100 MG PO TB12: 100.0000 mg | ORAL_TABLET | ORAL | Status: DC

## 2024-10-22 MED ORDER — MIDAZOLAM HCL 5 MG/5ML IJ SOLN
INTRAMUSCULAR | Status: DC | PRN
  Administered 2024-10-22: 2 mg via INTRAVENOUS

## 2024-10-22 MED ORDER — OXYCODONE HCL 5 MG/5ML PO SOLN: 5.0000 mg | Freq: Once | ORAL | Status: DC | PRN

## 2024-10-22 MED ORDER — 0.9 % SODIUM CHLORIDE (POUR BTL) OPTIME
TOPICAL | Status: DC | PRN
  Administered 2024-10-22: 1000 mL

## 2024-10-22 MED ORDER — DOCUSATE SODIUM 100 MG PO CAPS
100.0000 mg | ORAL_CAPSULE | Freq: Two times a day (BID) | ORAL | Status: DC
  Administered 2024-10-22 – 2024-10-23 (×2): 100 mg via ORAL
  Filled 2024-10-22 (×2): qty 1

## 2024-10-22 MED ORDER — PROPOFOL 10 MG/ML IV BOLUS
INTRAVENOUS | Status: DC | PRN
  Administered 2024-10-22: 200 mg via INTRAVENOUS

## 2024-10-22 MED ORDER — ISOPROPYL ALCOHOL 70 % SOLN
Status: DC | PRN
  Administered 2024-10-22: 1 via TOPICAL

## 2024-10-22 MED ORDER — HYDROMORPHONE HCL 1 MG/ML IJ SOLN
INTRAMUSCULAR | Status: AC
  Filled 2024-10-22: qty 1

## 2024-10-22 MED ORDER — PHENYLEPHRINE 80 MCG/ML (10ML) SYRINGE FOR IV PUSH (FOR BLOOD PRESSURE SUPPORT)
PREFILLED_SYRINGE | INTRAVENOUS | Status: DC | PRN
  Administered 2024-10-22: 80 ug via INTRAVENOUS
  Administered 2024-10-22: 160 ug via INTRAVENOUS

## 2024-10-22 MED ORDER — FENTANYL CITRATE (PF) 100 MCG/2ML IJ SOLN
INTRAMUSCULAR | Status: DC | PRN
  Administered 2024-10-22 (×5): 50 ug via INTRAVENOUS

## 2024-10-22 MED ORDER — KETOROLAC TROMETHAMINE 30 MG/ML IJ SOLN
INTRAMUSCULAR | Status: AC
  Filled 2024-10-22: qty 1

## 2024-10-22 MED ORDER — METHOCARBAMOL 1000 MG/10ML IJ SOLN: 500.0000 mg | Freq: Four times a day (QID) | INTRAMUSCULAR | Status: DC | PRN

## 2024-10-22 MED ORDER — ASPIRIN 81 MG PO CHEW
81.0000 mg | CHEWABLE_TABLET | Freq: Two times a day (BID) | ORAL | Status: DC
  Administered 2024-10-22 – 2024-10-23 (×2): 81 mg via ORAL
  Filled 2024-10-22 (×2): qty 1

## 2024-10-22 MED ORDER — CEFAZOLIN SODIUM-DEXTROSE 2-4 GM/100ML-% IV SOLN
2.0000 g | Freq: Four times a day (QID) | INTRAVENOUS | Status: AC
  Administered 2024-10-22 – 2024-10-23 (×2): 2 g via INTRAVENOUS
  Filled 2024-10-22 (×2): qty 100

## 2024-10-22 MED ORDER — HYDROCODONE-ACETAMINOPHEN 7.5-325 MG PO TABS
1.0000 | ORAL_TABLET | ORAL | Status: DC | PRN
  Administered 2024-10-22: 1 via ORAL
  Administered 2024-10-23: 2 via ORAL
  Administered 2024-10-23 (×2): 1 via ORAL
  Filled 2024-10-22: qty 2
  Filled 2024-10-22 (×3): qty 1

## 2024-10-22 MED ORDER — HYDROCODONE-ACETAMINOPHEN 5-325 MG PO TABS: 1.0000 | ORAL_TABLET | ORAL | Status: DC | PRN

## 2024-10-22 MED ORDER — FENTANYL CITRATE (PF) 100 MCG/2ML IJ SOLN
INTRAMUSCULAR | Status: AC
  Filled 2024-10-22: qty 2

## 2024-10-22 MED ORDER — AMISULPRIDE (ANTIEMETIC) 5 MG/2ML IV SOLN: 10.0000 mg | Freq: Once | INTRAVENOUS | Status: DC | PRN

## 2024-10-22 MED ORDER — DEXAMETHASONE SOD PHOSPHATE PF 10 MG/ML IJ SOLN
INTRAMUSCULAR | Status: DC | PRN
  Administered 2024-10-22: 8 mg via INTRAVENOUS

## 2024-10-22 MED ORDER — BUPIVACAINE-EPINEPHRINE (PF) 0.25% -1:200000 IJ SOLN
INTRAMUSCULAR | Status: AC
  Filled 2024-10-22: qty 30

## 2024-10-22 MED ORDER — SODIUM CHLORIDE (PF) 0.9 % IJ SOLN
INTRAMUSCULAR | Status: DC | PRN
  Administered 2024-10-22: 61 mL via INTRAMUSCULAR

## 2024-10-22 MED ORDER — ALUM & MAG HYDROXIDE-SIMETH 200-200-20 MG/5ML PO SUSP
30.0000 mL | ORAL | Status: DC | PRN
Start: 2024-10-22 — End: 2024-10-23

## 2024-10-22 MED ORDER — PROPOFOL 10 MG/ML IV BOLUS
INTRAVENOUS | Status: AC
  Filled 2024-10-22: qty 20

## 2024-10-22 MED ORDER — LACTATED RINGERS IV SOLN: INTRAVENOUS | Status: DC

## 2024-10-22 MED ORDER — METOCLOPRAMIDE HCL 5 MG PO TABS: 5.0000 mg | ORAL_TABLET | Freq: Three times a day (TID) | ORAL | Status: DC | PRN

## 2024-10-22 MED ORDER — FLUTICASONE PROPIONATE 50 MCG/ACT NA SUSP: 1.0000 | Freq: Every day | NASAL | Status: DC | PRN

## 2024-10-22 MED ORDER — ONDANSETRON HCL 4 MG/2ML IJ SOLN: 4.0000 mg | Freq: Four times a day (QID) | INTRAMUSCULAR | Status: DC | PRN

## 2024-10-22 MED ORDER — DEXAMETHASONE SODIUM PHOSPHATE 4 MG/ML IJ SOLN: INTRAMUSCULAR | Status: DC | PRN

## 2024-10-22 MED ORDER — CARVEDILOL 12.5 MG PO TABS
12.5000 mg | ORAL_TABLET | Freq: Two times a day (BID) | ORAL | Status: DC
  Administered 2024-10-23: 12.5 mg via ORAL
  Filled 2024-10-22: qty 1

## 2024-10-22 MED ORDER — ESCITALOPRAM OXALATE 20 MG PO TABS
20.0000 mg | ORAL_TABLET | Freq: Every day | ORAL | Status: DC
Start: 2024-10-23 — End: 2024-10-23
  Administered 2024-10-23: 20 mg via ORAL
  Filled 2024-10-22: qty 1

## 2024-10-22 MED ORDER — POLYETHYLENE GLYCOL 3350 17 G PO PACK: 17.0000 g | PACK | Freq: Every day | ORAL | Status: DC | PRN

## 2024-10-22 MED ORDER — METOCLOPRAMIDE HCL 5 MG/ML IJ SOLN: 5.0000 mg | Freq: Three times a day (TID) | INTRAMUSCULAR | Status: DC | PRN

## 2024-10-22 MED ORDER — MENTHOL 3 MG MT LOZG: 1.0000 | LOZENGE | OROMUCOSAL | Status: DC | PRN

## 2024-10-22 MED ORDER — PREGABALIN 75 MG PO CAPS
75.0000 mg | ORAL_CAPSULE | Freq: Two times a day (BID) | ORAL | Status: DC
  Administered 2024-10-23: 75 mg via ORAL
  Filled 2024-10-22: qty 1

## 2024-10-22 MED ORDER — ACETAMINOPHEN 500 MG PO TABS
500.0000 mg | ORAL_TABLET | Freq: Four times a day (QID) | ORAL | Status: DC
  Administered 2024-10-23: 500 mg via ORAL
  Filled 2024-10-22: qty 1

## 2024-10-22 MED ORDER — MORPHINE SULFATE (PF) 2 MG/ML IV SOLN: 0.5000 mg | INTRAVENOUS | Status: DC | PRN

## 2024-10-22 MED ORDER — ONDANSETRON HCL 4 MG PO TABS: 4.0000 mg | ORAL_TABLET | Freq: Four times a day (QID) | ORAL | Status: DC | PRN

## 2024-10-22 MED ORDER — ROPIVACAINE HCL 5 MG/ML IJ SOLN
INTRAMUSCULAR | Status: DC | PRN
Start: 2024-10-22 — End: 2024-10-22
  Administered 2024-10-22: 20 mL via PERINEURAL

## 2024-10-22 MED ORDER — PHENOL 1.4 % MT LIQD: 1.0000 | OROMUCOSAL | Status: DC | PRN

## 2024-10-22 MED ORDER — CLONIDINE HCL (ANALGESIA) 100 MCG/ML EP SOLN
EPIDURAL | Status: DC | PRN
  Administered 2024-10-22: 50 ug

## 2024-10-22 MED ORDER — ACETAMINOPHEN 500 MG PO TABS
1000.0000 mg | ORAL_TABLET | Freq: Once | ORAL | Status: AC
  Administered 2024-10-22: 1000 mg via ORAL
  Filled 2024-10-22: qty 2

## 2024-10-22 MED ORDER — SODIUM CHLORIDE 0.9 % IV SOLN: INTRAVENOUS | Status: DC

## 2024-10-22 MED ORDER — CEFAZOLIN SODIUM-DEXTROSE 3-4 GM/150ML-% IV SOLN
3.0000 g | INTRAVENOUS | Status: AC
  Administered 2024-10-22: 3 g via INTRAVENOUS
  Filled 2024-10-22: qty 150

## 2024-10-22 MED ORDER — EPHEDRINE SULFATE (PRESSORS) 25 MG/5ML IV SOSY
PREFILLED_SYRINGE | INTRAVENOUS | Status: DC | PRN
  Administered 2024-10-22: 10 mg via INTRAVENOUS

## 2024-10-22 MED ORDER — METHOCARBAMOL 500 MG PO TABS
500.0000 mg | ORAL_TABLET | Freq: Four times a day (QID) | ORAL | Status: DC | PRN
  Administered 2024-10-22 – 2024-10-23 (×3): 500 mg via ORAL
  Filled 2024-10-22 (×3): qty 1

## 2024-10-22 MED ORDER — POVIDONE-IODINE 10 % EX SWAB: 2.0000 | Freq: Once | CUTANEOUS | Status: DC

## 2024-10-22 MED ORDER — LIDOCAINE HCL (PF) 2 % IJ SOLN
INTRAMUSCULAR | Status: AC
Start: 2024-10-22 — End: 2024-10-22
  Filled 2024-10-22: qty 5

## 2024-10-22 MED ORDER — BUPROPION HCL ER (SR) 100 MG PO TB12
100.0000 mg | ORAL_TABLET | Freq: Every day | ORAL | Status: DC
  Filled 2024-10-22: qty 1

## 2024-10-22 MED ORDER — CHLORHEXIDINE GLUCONATE 0.12 % MT SOLN
15.0000 mL | Freq: Once | OROMUCOSAL | Status: AC
  Administered 2024-10-22: 15 mL via OROMUCOSAL

## 2024-10-22 MED ORDER — MIDAZOLAM HCL 2 MG/2ML IJ SOLN
INTRAMUSCULAR | Status: AC
  Filled 2024-10-22: qty 2

## 2024-10-22 MED ORDER — BISACODYL 10 MG RE SUPP: 10.0000 mg | Freq: Every day | RECTAL | Status: DC | PRN

## 2024-10-22 MED ORDER — BUPROPION HCL ER (SR) 100 MG PO TB12
200.0000 mg | ORAL_TABLET | Freq: Every day | ORAL | Status: DC
  Administered 2024-10-23: 200 mg via ORAL
  Filled 2024-10-22: qty 2

## 2024-10-22 MED ORDER — HYDROMORPHONE HCL 1 MG/ML IJ SOLN
0.2500 mg | INTRAMUSCULAR | Status: DC | PRN
  Administered 2024-10-22 (×2): 0.5 mg via INTRAVENOUS

## 2024-10-22 MED ORDER — LIDOCAINE HCL (CARDIAC) PF 100 MG/5ML IV SOSY
PREFILLED_SYRINGE | INTRAVENOUS | Status: DC | PRN
  Administered 2024-10-22: 100 mg via INTRAVENOUS

## 2024-10-22 MED ORDER — PANTOPRAZOLE SODIUM 40 MG PO TBEC
40.0000 mg | DELAYED_RELEASE_TABLET | Freq: Every day | ORAL | Status: DC
Start: 2024-10-22 — End: 2024-10-23
  Administered 2024-10-22 – 2024-10-23 (×2): 40 mg via ORAL
  Filled 2024-10-22 (×2): qty 1

## 2024-10-22 MED ORDER — FENTANYL CITRATE (PF) 50 MCG/ML IJ SOSY
50.0000 ug | PREFILLED_SYRINGE | INTRAMUSCULAR | Status: DC
  Administered 2024-10-22: 50 ug via INTRAVENOUS
  Filled 2024-10-22: qty 2

## 2024-10-22 MED ORDER — ORAL CARE MOUTH RINSE: 15.0000 mL | Freq: Once | OROMUCOSAL | Status: AC

## 2024-10-22 MED ORDER — MIDAZOLAM HCL (PF) 2 MG/2ML IJ SOLN
1.0000 mg | INTRAMUSCULAR | Status: DC
  Administered 2024-10-22: 1 mg via INTRAVENOUS
  Filled 2024-10-22: qty 2

## 2024-10-22 MED ORDER — LORATADINE 10 MG PO TABS
10.0000 mg | ORAL_TABLET | Freq: Every day | ORAL | Status: DC
  Administered 2024-10-23: 10 mg via ORAL
  Filled 2024-10-22: qty 1

## 2024-10-22 MED ORDER — SENNA 8.6 MG PO TABS
1.0000 | ORAL_TABLET | Freq: Two times a day (BID) | ORAL | Status: DC
  Administered 2024-10-22 – 2024-10-23 (×2): 8.6 mg via ORAL
  Filled 2024-10-22 (×2): qty 1

## 2024-10-22 MED ORDER — DIPHENHYDRAMINE HCL 12.5 MG/5ML PO ELIX: 12.5000 mg | ORAL_SOLUTION | ORAL | Status: DC | PRN

## 2024-10-22 MED ORDER — ALBUTEROL SULFATE (2.5 MG/3ML) 0.083% IN NEBU: 2.5000 mg | INHALATION_SOLUTION | Freq: Four times a day (QID) | RESPIRATORY_TRACT | Status: DC | PRN

## 2024-10-22 MED ORDER — ALBUTEROL SULFATE HFA 108 (90 BASE) MCG/ACT IN AERS: 2.0000 | INHALATION_SPRAY | Freq: Four times a day (QID) | RESPIRATORY_TRACT | Status: DC | PRN

## 2024-10-22 MED ORDER — ONDANSETRON HCL 4 MG/2ML IJ SOLN
INTRAMUSCULAR | Status: AC
  Filled 2024-10-22: qty 2

## 2024-10-22 MED ORDER — TRANEXAMIC ACID-NACL 1000-0.7 MG/100ML-% IV SOLN
1000.0000 mg | INTRAVENOUS | Status: AC
  Administered 2024-10-22: 1000 mg via INTRAVENOUS
  Filled 2024-10-22: qty 100

## 2024-10-22 MED ORDER — SODIUM CHLORIDE (PF) 0.9 % IJ SOLN
INTRAMUSCULAR | Status: AC
  Filled 2024-10-22: qty 30

## 2024-10-22 MED ORDER — ACETAMINOPHEN 325 MG PO TABS: 325.0000 mg | ORAL_TABLET | Freq: Four times a day (QID) | ORAL | Status: DC | PRN

## 2024-10-22 MED ORDER — OXYCODONE HCL 5 MG PO TABS: 5.0000 mg | ORAL_TABLET | Freq: Once | ORAL | Status: DC | PRN

## 2024-10-22 SURGICAL SUPPLY — 57 items
BAG COUNTER SPONGE SURGICOUNT (BAG) IMPLANT
BAG ZIPLOCK 12X15 (MISCELLANEOUS) ×1 IMPLANT
BATTERY INSTRU NAVIGATION (MISCELLANEOUS) ×3 IMPLANT
BLADE SAGITTAL AGGR TOOTH XLG (BLADE) ×1 IMPLANT
BLADE SAW RECIPROCATING 77.5 (BLADE) ×1 IMPLANT
BLADE SAW SAG 35X64 .89 (BLADE) ×1 IMPLANT
BNDG ELASTIC 4INX 5YD STR LF (GAUZE/BANDAGES/DRESSINGS) ×1 IMPLANT
BNDG ELASTIC 6INX 5YD STR LF (GAUZE/BANDAGES/DRESSINGS) ×1 IMPLANT
CHLORAPREP W/TINT 26 (MISCELLANEOUS) ×2 IMPLANT
COMPONENT FEM PS KN STD 10 LT (Knees) IMPLANT
COMPONENT PATELLA 3 PEG 35 (Joint) IMPLANT
COMPONENT TIB KNEE PS 0D LT (Joint) IMPLANT
COVER SURGICAL LIGHT HANDLE (MISCELLANEOUS) ×1 IMPLANT
DERMABOND ADVANCED .7 DNX12 (GAUZE/BANDAGES/DRESSINGS) ×2 IMPLANT
DRAPE SHEET LG 3/4 BI-LAMINATE (DRAPES) ×3 IMPLANT
DRAPE U-SHAPE 47X51 STRL (DRAPES) ×1 IMPLANT
DRSG AQUACEL AG ADV 3.5X10 (GAUZE/BANDAGES/DRESSINGS) ×1 IMPLANT
ELECT BLADE TIP CTD 4 INCH (ELECTRODE) ×1 IMPLANT
ELECT PENCIL ROCKER SW 15FT (MISCELLANEOUS) ×1 IMPLANT
ELECT REM PT RETURN 15FT ADLT (MISCELLANEOUS) ×1 IMPLANT
GAUZE SPONGE 4X4 12PLY STRL (GAUZE/BANDAGES/DRESSINGS) ×1 IMPLANT
GLOVE BIO SURGEON STRL SZ7 (GLOVE) ×1 IMPLANT
GLOVE BIO SURGEON STRL SZ8.5 (GLOVE) ×2 IMPLANT
GLOVE BIOGEL PI IND STRL 7.5 (GLOVE) ×1 IMPLANT
GLOVE BIOGEL PI IND STRL 8.5 (GLOVE) ×1 IMPLANT
GOWN SPEC L3 XXLG W/TWL (GOWN DISPOSABLE) ×1 IMPLANT
GOWN STRL REUS W/ TWL XL LVL3 (GOWN DISPOSABLE) ×1 IMPLANT
HOLDER FOLEY CATH W/STRAP (MISCELLANEOUS) ×1 IMPLANT
HOOD PEEL AWAY T7 (MISCELLANEOUS) ×3 IMPLANT
INSERT TIB ASF EF/3-11 10 LT (Insert) IMPLANT
KIT TURNOVER KIT A (KITS) ×1 IMPLANT
MARKER SKIN DUAL TIP RULER LAB (MISCELLANEOUS) ×1 IMPLANT
NDL SAFETY ECLIPSE 18X1.5 (NEEDLE) ×1 IMPLANT
NDL SPNL 18GX3.5 QUINCKE PK (NEEDLE) ×1 IMPLANT
NEEDLE SPNL 18GX3.5 QUINCKE PK (NEEDLE) ×1 IMPLANT
NS IRRIG 1000ML POUR BTL (IV SOLUTION) ×1 IMPLANT
PACK TOTAL KNEE CUSTOM (KITS) ×1 IMPLANT
PADDING CAST COTTON 6X4 STRL (CAST SUPPLIES) ×1 IMPLANT
PIN DRILL HDLS TROCAR 75 4PK (PIN) IMPLANT
PROTECTOR NERVE ULNAR (MISCELLANEOUS) ×1 IMPLANT
SCREW FEMALE HEX FIX 25X2.5 (ORTHOPEDIC DISPOSABLE SUPPLIES) IMPLANT
SEALER BIPOLAR AQUA 6.0 (INSTRUMENTS) ×1 IMPLANT
SET HNDPC FAN SPRY TIP SCT (DISPOSABLE) ×1 IMPLANT
SET PAD KNEE POSITIONER (MISCELLANEOUS) ×1 IMPLANT
SOLUTION PRONTOSAN WOUND 350ML (IRRIGATION / IRRIGATOR) ×1 IMPLANT
SPIKE FLUID TRANSFER (MISCELLANEOUS) ×2 IMPLANT
SUT MNCRL AB 3-0 PS2 18 (SUTURE) ×1 IMPLANT
SUT MON AB 2-0 CT1 36 (SUTURE) ×1 IMPLANT
SUT STRATAFIX 14 PDO 48 VLT (SUTURE) ×1 IMPLANT
SUT VIC AB 1 CTX36XBRD ANBCTR (SUTURE) ×2 IMPLANT
SUT VIC AB 2-0 CT1 TAPERPNT 27 (SUTURE) ×1 IMPLANT
SYR 3ML LL SCALE MARK (SYRINGE) ×1 IMPLANT
TOWEL GREEN STERILE FF (TOWEL DISPOSABLE) ×1 IMPLANT
TRAY FOLEY MTR SLVR 16FR STAT (SET/KITS/TRAYS/PACK) ×1 IMPLANT
TUBE SUCTION HIGH CAP CLEAR NV (SUCTIONS) ×1 IMPLANT
WATER STERILE IRR 1000ML POUR (IV SOLUTION) ×2 IMPLANT
WRAP KNEE MAXI GEL POST OP (GAUZE/BANDAGES/DRESSINGS) ×1 IMPLANT

## 2024-10-22 NOTE — Op Note (Signed)
 OPERATIVE REPORT  SURGEON: Redell Shoals, MD   ASSISTANT: Valery Potters, PA-C  PREOPERATIVE DIAGNOSIS: Primary Left knee arthritis.   POSTOPERATIVE DIAGNOSIS: Primary Left knee arthritis.   PROCEDURE: Computer assisted Left total knee arthroplasty.   IMPLANTS: Zimmer Persona PPS Cementless CR femur, size 10. Persona 0 degree Spiked Keel OsseoTi Tibia, size F. Vivacit-E polyethelyene insert, size 10 mm, CR. OsseoTi 3-Peg patella, size 35 mm.  ANESTHESIA:  MAC, Regional, and Spinal  TOURNIQUET TIME: Not utilized.   ESTIMATED BLOOD LOSS:-500 mL    ANTIBIOTICS: 3g Ancef .  DRAINS: None.  COMPLICATIONS: None   CONDITION: PACU - hemodynamically stable.   BRIEF CLINICAL NOTE: Robin [name] is a 53 y.o. female with a long-standing history of Left knee arthritis. After failing conservative management, the patient was indicated for total knee arthroplasty. The risks, benefits, and alternatives to the procedure were explained, and the patient elected to proceed.  PROCEDURE IN DETAIL: Adductor canal block was obtained in the pre-op holding area. Once inside the operative room, spinal anesthesia was obtained, and a foley catheter was inserted. The patient was then positioned and the lower extremity was prepped and draped in the normal sterile surgical fashion.  A time-out was called verifying side and site of surgery. The patient received IV antibiotics within 60 minutes of beginning the procedure. A tourniquet was not utilized.   An anterior approach to the knee was performed utilizing a midvastus arthrotomy. A medial release was performed and the patellar fat pad was excised. Stryker imageless navigation was used to cut the distal femur perpendicular to the mechanical axis. A freehand patellar resection was performed, and the patella was sized an prepared with 3 lug holes.  Nagivation was used to make a neutral proximal tibia resection, taking 9 mm of bone from the less affected lateral  side with 3 degrees of slope. The menisci were excised. A spacer block was placed, and the alignment and balance in extension were confirmed.   The distal femur was sized using the 3-degree external rotation guide referencing the posterior femoral cortex. The appropriate 4-in-1 cutting block was pinned into place. Rotation was checked using Whiteside's line, the epicondylar axis, and then confirmed with a spacer block in flexion. The remaining femoral cuts were performed, taking care to protect the MCL.  The tibia was sized and the trial tray was pinned into place. The remaining trail components were inserted. The knee was stable to varus and valgus stress through a full range of motion. The patella tracked centrally, and the PCL was well balanced. The trial components were removed, and the proximal tibial surface was prepared. Final components were impacted into place. The knee was tested for a final time and found to be well balanced.   The wound was copiously irrigated with Prontosan solution and normal saline using pulse lavage.  Marcaine  solution was injected into the periarticular soft tissue.  The wound was closed in layers using #1 Vicryl and Stratafix for the fascia, 2-0 Vicryl for the subcutaneous fat, 2-0 Monocryl for the deep dermal layer, and skin staples. Dermabond was applied to the skin.  Once the glue was fully dried, an Aquacell Ag and compressive dressing were applied.  The patient was transported to the recovery room in stable condition.  Sponge, needle, and instrument counts were correct at the end of the case x2.  The patient tolerated the procedure well and there were no known complications.  Please note that a surgical assistant was a medical necessity for this  procedure in order to perform it in a safe and expeditious manner. Surgical assistant was necessary to retract the ligaments and vital neurovascular structures to prevent injury to them and also necessary for proper positioning  of the limb to allow for anatomic placement of the prosthesis.

## 2024-10-22 NOTE — Discharge Instructions (Signed)

## 2024-10-22 NOTE — Evaluation (Signed)
 Physical Therapy Evaluation Patient Details Name: Robin [name] MRN: [id number] DOB: 02-Aug-1971 Today's Date: 10/22/2024  History of Present Illness  53 yo female presents to therapy s/p L TKA on 10/22/2024 due to failure of conservative measures. Pt PMH includes but is not limited to: OA, R THA (05/2024), GERD, RA, GAD, lumbar stenosis, cervical pain, OSA, gastric bypass, B foot surgery and lumbar fusion.  Clinical Impression    Robin [name] is a 53 y.o. female POD 0 s/p L TKA. Patient reports mod I with mobility at baseline. Patient is now limited by functional impairments (see PT problem list below) and requires S for bed mobility and CGA for transfers. Patient was able to ambulate 20 and 15 feet with RW and CGA level of assist. Patient instructed in exercise to facilitate ROM and circulation to manage edema. Patient will benefit from continued skilled PT interventions to address impairments and progress towards PLOF. Acute PT will follow to progress mobility and stair training in preparation for safe discharge home with family and social support and OPPT services.       If plan is discharge home, recommend the following: A little help with walking and/or transfers;A little help with bathing/dressing/bathroom;Assistance with cooking/housework;Assist for transportation;Help with stairs or ramp for entrance   Can travel by private vehicle        Equipment Recommendations None recommended by PT  Recommendations for Other Services       Functional Status Assessment Patient has had a recent decline in their functional status and demonstrates the ability to make significant improvements in function in a reasonable and predictable amount of time.     Precautions / Restrictions Precautions Precautions: Fall;Knee Restrictions Weight Bearing Restrictions Per Provider Order: No      Mobility  Bed Mobility Overal bed mobility: Needs Assistance Bed Mobility: Supine to Sit      Supine to sit: Supervision, HOB elevated     General bed mobility comments: min cues    Transfers Overall transfer level: Needs assistance Equipment used: Rolling walker (2 wheels) Transfers: Sit to/from Stand Sit to Stand: Contact guard assist           General transfer comment: min cues for transfer tasks to bed, recliner and commdoe for UE and AD placement    Ambulation/Gait Ambulation/Gait assistance: Contact guard assist Gait Distance (Feet): 20 Feet Assistive device: Rolling walker (2 wheels) Gait Pattern/deviations: Step-to pattern, Decreased stance time - left, Antalgic, Knee hyperextension - left, Trunk flexed Gait velocity: decreased     General Gait Details: slight trunk flexion with B UE support at RW to offload L  LE in stance phase, min cues for safety and RW management pt exhibited 2 episodes of l knee hyperextension and instabiltiy  Stairs            Wheelchair Mobility     Tilt Bed    Modified Rankin (Stroke Patients Only)       Balance Overall balance assessment: Needs assistance Sitting-balance support: Feet supported Sitting balance-Leahy Scale: Good     Standing balance support: Bilateral upper extremity supported, During functional activity, Reliant on assistive device for balance Standing balance-Leahy Scale: Fair Standing balance comment: static standing no UE support                             Pertinent Vitals/Pain Pain Assessment Pain Assessment: 0-10 Pain Score: 8  Pain Location: L knee and LE Pain Descriptors /  Indicators: Aching, Constant, Discomfort, Dull, Grimacing, Operative site guarding, Spasm Pain Intervention(s): Limited activity within patient's tolerance, Monitored during session, Premedicated before session, Repositioned, Ice applied    Home Living Family/patient expects to be discharged to:: Private residence Living Arrangements: Alone Available Help at Discharge: Family;Available 24  hours/day Type of Home: House Home Access: Stairs to enter Entrance Stairs-Rails: None Entrance Stairs-Number of Steps: 1+1   Home Layout: One level Home Equipment: Agricultural Consultant (2 wheels);Cane - single point      Prior Function Prior Level of Function : Independent/Modified Independent             Mobility Comments: mod I with use of SPC for longer community distances secondary to back pain, IND for all ADLs, self care tasks and IADL       Extremity/Trunk Assessment             Cervical / Trunk Assessment Cervical / Trunk Assessment: Back Surgery  Communication   Communication Communication: No apparent difficulties    Cognition Arousal: Alert Behavior During Therapy: WFL for tasks assessed/performed   PT - Cognitive impairments: No apparent impairments                         Following commands: Intact       Cueing       General Comments      Exercises Total Joint Exercises Ankle Circles/Pumps: AROM, Both, 10 reps   Assessment/Plan    PT Assessment Patient needs continued PT services  PT Problem List Decreased strength;Decreased range of motion;Decreased activity tolerance;Decreased balance;Decreased mobility;Decreased coordination;Pain       PT Treatment Interventions DME instruction;Gait training;Stair training;Functional mobility training;Therapeutic activities;Therapeutic exercise;Balance training;Neuromuscular re-education;Patient/family education;Modalities    PT Goals (Current goals can be found in the Care Plan section)  Acute Rehab PT Goals Patient Stated Goal: to hopefully get rid of the back pain, walk more normally and no cane PT Goal Formulation: With patient Time For Goal Achievement: 11/05/24 Potential to Achieve Goals: Good    Frequency 7X/week     Co-evaluation               AM-PAC PT 6 Clicks Mobility  Outcome Measure Help needed turning from your back to your side while in a flat bed without using  bedrails?: None Help needed moving from lying on your back to sitting on the side of a flat bed without using bedrails?: A Little Help needed moving to and from a bed to a chair (including a wheelchair)?: A Little Help needed standing up from a chair using your arms (e.g., wheelchair or bedside chair)?: A Little Help needed to walk in hospital room?: A Little Help needed climbing 3-5 steps with a railing? : A Lot 6 Click Score: 18    End of Session Equipment Utilized During Treatment: Gait belt Activity Tolerance: Patient tolerated treatment well Patient left: in chair;with call bell/phone within reach;with family/visitor present Nurse Communication: Mobility status;Patient requests pain meds PT Visit Diagnosis: Unsteadiness on feet (R26.81);Other abnormalities of gait and mobility (R26.89);Muscle weakness (generalized) (M62.81);Difficulty in walking, not elsewhere classified (R26.2);Pain Pain - Right/Left: Left Pain - part of body: Knee;Leg    Time: 8161-8143 PT Time Calculation (min) (ACUTE ONLY): 18 min   Charges:   PT Evaluation $PT Eval Low Complexity: 1 Low   PT General Charges $$ ACUTE PT VISIT: 1 Visit         [place], PT Acute Rehab   [place] DEL  Robin [name] 10/22/2024, 7:27 PM

## 2024-10-22 NOTE — Anesthesia Preprocedure Evaluation (Signed)
 Anesthesia Evaluation  Patient identified by MRN, date of birth, ID band Patient awake    Reviewed: Allergy & Precautions, NPO status , Patient's Chart, lab work & pertinent test results  Airway Mallampati: II  TM Distance: >3 FB Neck ROM: Full    Dental  (+) Dental Advisory Given   Pulmonary asthma , sleep apnea and Continuous Positive Airway Pressure Ventilation , former smoker   breath sounds clear to auscultation       Cardiovascular hypertension, Pt. on medications  Rhythm:Regular Rate:Normal     Neuro/Psych  PSYCHIATRIC DISORDERS       Neuromuscular disease    GI/Hepatic Neg liver ROS,GERD  ,,  Endo/Other  negative endocrine ROS    Renal/GU negative Renal ROS     Musculoskeletal  (+) Arthritis ,    Abdominal   Peds  Hematology negative hematology ROS (+)   Anesthesia Other Findings   Reproductive/Obstetrics                              Lab Results  Component Value Date   WBC 8.6 10/15/2024   HGB 13.5 10/15/2024   HCT 42.9 10/15/2024   MCV 87.0 10/15/2024   PLT 288 10/15/2024   Lab Results  Component Value Date   NA 142 09/23/2024   CL 105 09/23/2024   K 3.5 09/23/2024   CO2 31 09/23/2024   BUN 15 09/23/2024   CREATININE 0.75 09/23/2024   GFRNONAA >60 06/12/2024   CALCIUM  9.4 09/23/2024   ALBUMIN  3.9 09/23/2024   GLUCOSE 93 09/23/2024    Anesthesia Physical Anesthesia Plan  ASA: 2  Anesthesia Plan: General   Post-op Pain Management: Regional block* and Tylenol  PO (pre-op)*   Induction: Intravenous  PONV Risk Score and Plan: 2 and Dexamethasone , Ondansetron , Treatment may vary due to age or medical condition and Midazolam   Airway Management Planned: LMA  Additional Equipment:   Intra-op Plan:   Post-operative Plan: Extubation in OR  Informed Consent: I have reviewed the patients History and Physical, chart, labs and discussed the procedure including the  risks, benefits and alternatives for the proposed anesthesia with the patient or authorized representative who has indicated his/her understanding and acceptance.     Dental advisory given  Plan Discussed with: CRNA  Anesthesia Plan Comments:          Anesthesia Quick Evaluation

## 2024-10-22 NOTE — Anesthesia Procedure Notes (Signed)
 Procedure Name: LMA Insertion Date/Time: 10/22/2024 11:23 AM  Performed by: Deeann Eva BROCKS, CRNAPre-anesthesia Checklist: Patient identified, Emergency Drugs available, Suction available and Patient being monitored Patient Re-evaluated:Patient Re-evaluated prior to induction Oxygen Delivery Method: Circle System Utilized Preoxygenation: Pre-oxygenation with 100% oxygen Induction Type: IV induction Ventilation: Mask ventilation without difficulty LMA: LMA with gastric port inserted LMA Size: 4.0 Number of attempts: 1 Airway Equipment and Method: Bite block Placement Confirmation: positive ETCO2 Tube secured with: Tape Dental Injury: Teeth and Oropharynx as per pre-operative assessment

## 2024-10-22 NOTE — Progress Notes (Signed)
   10/22/24 2201  BiPAP/CPAP/SIPAP  $ Non-Invasive Home Ventilator  Initial  BiPAP/CPAP/SIPAP Pt Type Adult  BiPAP/CPAP/SIPAP Resmed  Mask Type Nasal pillows  Dentures removed? Not applicable  Respiratory Rate 18 breaths/min  FiO2 (%) 21 %  Patient Home Machine No  Patient Home Mask Yes  Patient Home Tubing Yes  Auto Titrate Yes  Minimum cmH2O 5 cmH2O  Maximum cmH2O 20 cmH2O  CPAP/SIPAP surface wiped down Yes  Device Plugged into RED Power Outlet Yes

## 2024-10-22 NOTE — Transfer of Care (Signed)
 Immediate Anesthesia Transfer of Care Note  Patient: Robin [name]  Procedure(s) Performed: ARTHROPLASTY, KNEE, TOTAL, USING IMAGELESS COMPUTER-ASSISTED NAVIGATION (Left: Knee)  Patient Location: PACU  Anesthesia Type:General and Regional  Level of Consciousness: drowsy  Airway & Oxygen Therapy: Patient Spontanous Breathing and Patient connected to face mask oxygen  Post-op Assessment: Report given to RN and Post -op Vital signs reviewed and stable  Post vital signs: Reviewed and stable  Last Vitals:  Vitals Value Taken Time  BP 137/72 10/22/24 14:52  Temp 37.3 C 10/22/24 14:52  Pulse 87 10/22/24 14:54  Resp 14 10/22/24 14:54  SpO2 98% 10/22/24 14:54  Vitals shown include unfiled device data.  Last Pain:  Vitals:   10/22/24 1452  TempSrc:   PainSc: 0-No pain         Complications: No notable events documented.

## 2024-10-22 NOTE — Interval H&P Note (Signed)
 History and Physical Interval Note:  10/22/2024 10:17 AM  Robin [name]  has presented today for surgery, with the diagnosis of Left knee osteoarthritis.  The various methods of treatment have been discussed with the patient and family. After consideration of risks, benefits and other options for treatment, the patient has consented to  Procedure(s): ARTHROPLASTY, KNEE, TOTAL, USING IMAGELESS COMPUTER-ASSISTED NAVIGATION (Left) as a surgical intervention.  The patient's history has been reviewed, patient examined, no change in status, stable for surgery.  I have reviewed the patient's chart and labs.  Questions were answered to the patient's satisfaction.     [name] [name]

## 2024-10-22 NOTE — Anesthesia Procedure Notes (Signed)
 Anesthesia Regional Block: Adductor canal block   Pre-Anesthetic Checklist: , timeout performed,  Correct Patient, Correct Site, Correct Laterality,  Correct Procedure, Correct Position, site marked,  Risks and benefits discussed,  Surgical consent,  Pre-op evaluation,  At surgeon's request and post-op pain management  Laterality: Left  Prep: chloraprep       Needles:  Injection technique: Single-shot  Needle Type: Echogenic Needle     Needle Length: 9cm  Needle Gauge: 21     Additional Needles:   Procedures:,,,, ultrasound used (permanent image in chart),,    Narrative:  Start time: 10/22/2024 9:50 AM End time: 10/22/2024 9:56 AM Injection made incrementally with aspirations every 5 mL.  Performed by: Personally  Anesthesiologist: Epifanio Charleston, MD

## 2024-10-23 ENCOUNTER — Other Ambulatory Visit: Payer: Self-pay

## 2024-10-23 ENCOUNTER — Other Ambulatory Visit (HOSPITAL_COMMUNITY): Payer: Self-pay

## 2024-10-23 ENCOUNTER — Encounter (HOSPITAL_COMMUNITY): Payer: Self-pay | Admitting: Orthopedic Surgery

## 2024-10-23 DIAGNOSIS — M1712 Unilateral primary osteoarthritis, left knee: Secondary | ICD-10-CM | POA: Diagnosis not present

## 2024-10-23 LAB — CBC
HCT: 35.9 % — ABNORMAL LOW (ref 36.0–46.0)
Hemoglobin: 11.2 g/dL — ABNORMAL LOW (ref 12.0–15.0)
MCH: 27.6 pg (ref 26.0–34.0)
MCHC: 31.2 g/dL (ref 30.0–36.0)
MCV: 88.4 fL (ref 80.0–100.0)
Platelets: 225 K/uL (ref 150–400)
RBC: 4.06 MIL/uL (ref 3.87–5.11)
RDW: 12.9 % (ref 11.5–15.5)
WBC: 12.3 K/uL — ABNORMAL HIGH (ref 4.0–10.5)
nRBC: 0 % (ref 0.0–0.2)

## 2024-10-23 LAB — BASIC METABOLIC PANEL WITH GFR
Anion gap: 11 (ref 5–15)
BUN: 13 mg/dL (ref 6–20)
CO2: 24 mmol/L (ref 22–32)
Calcium: 8.9 mg/dL (ref 8.9–10.3)
Chloride: 107 mmol/L (ref 98–111)
Creatinine, Ser: 0.85 mg/dL (ref 0.44–1.00)
GFR, Estimated: >60 mL/min (ref >60)
Glucose, Bld: 115 mg/dL — ABNORMAL HIGH (ref 70–99)
Potassium: 4.1 mmol/L (ref 3.5–5.1)
Sodium: 142 mmol/L (ref 135–145)

## 2024-10-23 MED ORDER — ONDANSETRON HCL 4 MG PO TABS
4.0000 mg | ORAL_TABLET | Freq: Three times a day (TID) | ORAL | 0 refills | Status: AC | PRN
  Filled 2024-10-23: qty 30, 10d supply, fill #0

## 2024-10-23 MED ORDER — SENNA 8.6 MG PO TABS
2.0000 | ORAL_TABLET | Freq: Every day | ORAL | 0 refills | Status: AC
  Filled 2024-10-23: qty 30, 15d supply, fill #0

## 2024-10-23 MED ORDER — HYDROCODONE-ACETAMINOPHEN 10-325 MG PO TABS
1.0000 | ORAL_TABLET | ORAL | 0 refills | Status: AC | PRN
  Filled 2024-10-23: qty 42, 7d supply, fill #0

## 2024-10-23 MED ORDER — HYDROCODONE-ACETAMINOPHEN 7.5-325 MG/15ML PO SOLN
10.0000 mg | ORAL | 0 refills | Status: DC | PRN
  Filled 2024-10-23: qty 60, 1d supply, fill #0

## 2024-10-23 MED ORDER — DOCUSATE SODIUM 100 MG PO CAPS
100.0000 mg | ORAL_CAPSULE | Freq: Two times a day (BID) | ORAL | 0 refills | Status: AC
  Filled 2024-10-23: qty 60, 30d supply, fill #0

## 2024-10-23 MED ORDER — POLYETHYLENE GLYCOL 3350 17 GM/SCOOP PO POWD
17.0000 g | Freq: Every day | ORAL | 0 refills | Status: AC | PRN
  Filled 2024-10-23: qty 238, 14d supply, fill #0

## 2024-10-23 MED ORDER — METHOCARBAMOL 500 MG PO TABS
500.0000 mg | ORAL_TABLET | Freq: Four times a day (QID) | ORAL | 0 refills | Status: AC | PRN
  Filled 2024-10-23: qty 20, 5d supply, fill #0

## 2024-10-23 MED ORDER — ASPIRIN 81 MG PO CHEW
81.0000 mg | CHEWABLE_TABLET | Freq: Two times a day (BID) | ORAL | 0 refills | Status: AC
  Filled 2024-10-23: qty 90, 45d supply, fill #0

## 2024-10-23 NOTE — Plan of Care (Signed)
  Problem: Coping: Goal: Level of anxiety will decrease Outcome: Progressing   Problem: Pain Managment: Goal: General experience of comfort will improve and/or be controlled Outcome: Progressing   Problem: Elimination: Goal: Will not experience complications related to bowel motility Outcome: Progressing Goal: Will not experience complications related to urinary retention Outcome: Progressing   Problem: Safety: Goal: Ability to remain free from injury will improve Outcome: Progressing

## 2024-10-23 NOTE — Progress Notes (Signed)
 Physical Therapy Treatment Patient Details Name: Robin [name] MRN: [id number] DOB: [birth year] Today's Date: 10/23/2024   History of Present Illness 53 yo female presents to therapy s/p L TKA on 10/22/2024 due to failure of conservative measures. Pt PMH includes but is not limited to: OA, R THA (05/2024), GERD, RA, GAD, lumbar stenosis, cervical pain, OSA, gastric bypass, B foot surgery and lumbar fusion.    PT Comments  Pt motivated and progressing well with mobility including up to ambulate increased distance in hall and performed HEP with assist.  Stairs deferred with pt becoming diaphoretic and requesting to sit - BP 126/69 - Pt reports likely one of my hot flashes.  Will follow up with stair training as tolerated.    If plan is discharge home, recommend the following: A little help with walking and/or transfers;A little help with bathing/dressing/bathroom;Assistance with cooking/housework;Assist for transportation;Help with stairs or ramp for entrance   Can travel by private vehicle        Equipment Recommendations  None recommended by PT    Recommendations for Other Services       Precautions / Restrictions Precautions Precautions: Fall;Knee Restrictions Weight Bearing Restrictions Per Provider Order: No LLE Weight Bearing Per Provider Order: Weight bearing as tolerated     Mobility  Bed Mobility               General bed mobility comments: Pt up in chair and requests back to same    Transfers Overall transfer level: Needs assistance Equipment used: Rolling walker (2 wheels) Transfers: Sit to/from Stand Sit to Stand: Contact guard assist           General transfer comment: min cues for LE management and use of UEs to self assist    Ambulation/Gait Ambulation/Gait assistance: Contact guard assist Gait Distance (Feet): 90 Feet Assistive device: Rolling walker (2 wheels) Gait Pattern/deviations: Step-to pattern, Decreased stance time - left,  Antalgic, Knee hyperextension - left, Trunk flexed Gait velocity: decreased     General Gait Details: slight trunk flexion with B UE support at RW to offload L  LE in stance phase, min cues for safety and RW management pt exhibited 2 episodes of l knee hyperextension and instabiltiy   Stairs             Wheelchair Mobility     Tilt Bed    Modified Rankin (Stroke Patients Only)       Balance Overall balance assessment: Needs assistance Sitting-balance support: Feet supported Sitting balance-Leahy Scale: Good     Standing balance support: Single extremity supported Standing balance-Leahy Scale: Fair                              Hotel Manager: No apparent difficulties  Cognition Arousal: Alert Behavior During Therapy: WFL for tasks assessed/performed   PT - Cognitive impairments: No apparent impairments                         Following commands: Intact      Cueing    Exercises Total Joint Exercises Ankle Circles/Pumps: AROM, Both, 15 reps, Supine Quad Sets: AROM, Both, 10 reps, Supine Heel Slides: AAROM, Left, 15 reps, Supine Hip ABduction/ADduction: AAROM, Left, 10 reps, Supine Straight Leg Raises: AAROM, Left, 15 reps, Supine Long Arc Quad: AAROM, Left, 10 reps, Seated Goniometric ROM: AAROM L knee -5 - 35    General Comments  Pertinent Vitals/Pain Pain Assessment Pain Assessment: 0-10 Pain Score: 7  Pain Location: L knee and LE Pain Descriptors / Indicators: Aching, Constant, Discomfort, Dull, Grimacing, Operative site guarding, Spasm Pain Intervention(s): Limited activity within patient's tolerance, Premedicated before session, Monitored during session, Ice applied    Home Living                          Prior Function            PT Goals (current goals can now be found in the care plan section) Acute Rehab PT Goals Patient Stated Goal: to hopefully get rid of the back  pain, walk more normally and no cane PT Goal Formulation: With patient Time For Goal Achievement: 11/05/24 Potential to Achieve Goals: Good Progress towards PT goals: Progressing toward goals    Frequency    7X/week      PT Plan      Co-evaluation              AM-PAC PT 6 Clicks Mobility   Outcome Measure  Help needed turning from your back to your side while in a flat bed without using bedrails?: None Help needed moving from lying on your back to sitting on the side of a flat bed without using bedrails?: A Little Help needed moving to and from a bed to a chair (including a wheelchair)?: A Little Help needed standing up from a chair using your arms (e.g., wheelchair or bedside chair)?: A Little Help needed to walk in hospital room?: A Little Help needed climbing 3-5 steps with a railing? : A Lot 6 Click Score: 18    End of Session Equipment Utilized During Treatment: Gait belt Activity Tolerance: Patient tolerated treatment well;Other (comment) (diaphoretic - pt states likely hot flash) Patient left: in chair;with call bell/phone within reach Nurse Communication: Mobility status PT Visit Diagnosis: Unsteadiness on feet (R26.81);Other abnormalities of gait and mobility (R26.89);Muscle weakness (generalized) (M62.81);Difficulty in walking, not elsewhere classified (R26.2);Pain Pain - Right/Left: Left Pain - part of body: Knee;Leg     Time: 9049-8972 PT Time Calculation (min) (ACUTE ONLY): 37 min  Charges:    $Gait Training: 8-22 mins $Therapeutic Exercise: 23-37 mins PT General Charges $$ ACUTE PT VISIT: 1 Visit                     [facility] PT Acute Rehabilitation Services Office [phone]    [place] Bashore 10/23/2024, 11:57 AM

## 2024-10-23 NOTE — Progress Notes (Signed)
    Subjective:  Patient reports pain as mild to moderate.  Denies N/V/CP/SOB/Abd pain. She reports she is doing well.  She denies tingling or numbness in LE bilaterally.  Reports some pain this morning, but doing well with medications.  She has been voiding without difficulty.    Objective:   VITALS:   Vitals:   10/22/24 1841 10/22/24 2037 10/23/24 0208 10/23/24 0644  BP: (!) 154/79 (!) 151/79 134/77 135/78  Pulse: 83 92 73 80  Resp: 17 18 18 18   Temp: 98 F (36.7 C) 98.6 F (37 C) 98.2 F (36.8 C) 97.9 F (36.6 C)  TempSrc: Oral Oral Oral Oral  SpO2: 100% 96% 97% 100%  Weight:      Height:        NAD Neurologically intact ABD soft Neurovascular intact Sensation intact distally Intact pulses distally Dorsiflexion/Plantar flexion intact Incision: dressing C/D/I No cellulitis present Compartment soft   Lab Results  Component Value Date   WBC 12.3 (H) 10/23/2024   HGB 11.2 (L) 10/23/2024   HCT 35.9 (L) 10/23/2024   MCV 88.4 10/23/2024   PLT 225 10/23/2024   BMET    Component Value Date/Time   NA 142 10/23/2024 0408   NA 143 04/04/2022 1426   K 4.1 10/23/2024 0408   CL 107 10/23/2024 0408   CO2 24 10/23/2024 0408   GLUCOSE 115 (H) 10/23/2024 0408   BUN 13 10/23/2024 0408   BUN 15 04/04/2022 1426   CREATININE 0.85 10/23/2024 0408   CREATININE 0.73 04/28/2024 1514   CALCIUM  8.9 10/23/2024 0408   EGFR 98 04/28/2024 1514   EGFR 66 04/04/2022 1426   GFRNONAA >60 10/23/2024 0408     Assessment/Plan: 1 Day Post-Op   Principal Problem:   S/P total knee arthroplasty, left Active Problems:   Osteoarthritis of left knee   WBAT with walker DVT ppx: Aspirin , SCDs, TEDS PO pain control PT/OT: She ambulated 20 and 15 feet with PT yesterday. Continue PT today.  Dispo:  -D/c home with OPPT once cleared with PT.    Valery GORMAN Potters 10/23/2024, 7:32 AM   [facility]  [place] [address], [address], [place], [place] [address] Phone:  [phone] [email or web address] Facebook  [employer]

## 2024-10-23 NOTE — Progress Notes (Signed)
 Physical Therapy Treatment Patient Details Name: Robin [name] MRN: [id number] DOB: [birth year] Today's Date: 10/23/2024   History of Present Illness 53 yo female presents to therapy s/p L TKA on 10/22/2024 due to failure of conservative measures. Pt PMH includes but is not limited to: OA, R THA (05/2024), GERD, RA, GAD, lumbar stenosis, cervical pain, OSA, gastric bypass, B foot surgery and lumbar fusion.    PT Comments  Pt continues motivated and progressing well with mobility.  Pt up to ambulate in hall, negotiated stairs and reviewed written HEP.  Pt eager for dc home this date.   If plan is discharge home, recommend the following: A little help with walking and/or transfers;A little help with bathing/dressing/bathroom;Assistance with cooking/housework;Assist for transportation;Help with stairs or ramp for entrance   Can travel by private vehicle        Equipment Recommendations  None recommended by PT    Recommendations for Other Services       Precautions / Restrictions Precautions Precautions: Fall;Knee Restrictions Weight Bearing Restrictions Per Provider Order: No LLE Weight Bearing Per Provider Order: Weight bearing as tolerated     Mobility  Bed Mobility               General bed mobility comments: Pt up in chair and requests back to same    Transfers Overall transfer level: Needs assistance Equipment used: Rolling walker (2 wheels) Transfers: Sit to/from Stand Sit to Stand: Contact guard assist           General transfer comment: min cues for LE management and use of UEs to self assist    Ambulation/Gait Ambulation/Gait assistance: Contact guard assist Gait Distance (Feet): 90 Feet Assistive device: Rolling walker (2 wheels) Gait Pattern/deviations: Step-to pattern, Decreased stance time - left, Antalgic, Knee hyperextension - left, Trunk flexed Gait velocity: decreased     General Gait Details: slight trunk flexion with B UE support at  RW to offload L  LE in stance phase, min cues for safety and RW management   Stairs Stairs: Yes Stairs assistance: Min assist Stair Management: No rails, Step to pattern, Backwards, Forwards, With walker Number of Stairs: 2 General stair comments: single step twice - once fwd and once bkwd - written instruction provided   Wheelchair Mobility     Tilt Bed    Modified Rankin (Stroke Patients Only)       Balance Overall balance assessment: Needs assistance Sitting-balance support: Feet supported Sitting balance-Leahy Scale: Good     Standing balance support: No upper extremity supported Standing balance-Leahy Scale: Fair                              Hotel Manager: No apparent difficulties  Cognition Arousal: Alert Behavior During Therapy: WFL for tasks assessed/performed   PT - Cognitive impairments: No apparent impairments                         Following commands: Intact      Cueing    Exercises Total Joint Exercises Ankle Circles/Pumps: AROM, Both, 15 reps, Supine Quad Sets: AROM, Both, 10 reps, Supine Heel Slides: AAROM, Left, 15 reps, Supine Hip ABduction/ADduction: AAROM, Left, 10 reps, Supine Straight Leg Raises: AAROM, Left, 15 reps, Supine Long Arc Quad: AAROM, Left, 10 reps, Seated Goniometric ROM: AAROM L knee -5 - 35    General Comments        Pertinent  Vitals/Pain Pain Assessment Pain Assessment: 0-10 Pain Score: 7  Pain Location: L knee and LE Pain Descriptors / Indicators: Aching, Constant, Discomfort, Dull, Operative site guarding, Spasm Pain Intervention(s): Limited activity within patient's tolerance, Monitored during session, Premedicated before session, Ice applied    Home Living                          Prior Function            PT Goals (current goals can now be found in the care plan section) Acute Rehab PT Goals Patient Stated Goal: to hopefully get rid of the  back pain, walk more normally and no cane PT Goal Formulation: With patient Time For Goal Achievement: 11/05/24 Potential to Achieve Goals: Good Progress towards PT goals: Progressing toward goals    Frequency    7X/week      PT Plan      Co-evaluation              AM-PAC PT 6 Clicks Mobility   Outcome Measure  Help needed turning from your back to your side while in a flat bed without using bedrails?: None Help needed moving from lying on your back to sitting on the side of a flat bed without using bedrails?: A Little Help needed moving to and from a bed to a chair (including a wheelchair)?: A Little Help needed standing up from a chair using your arms (e.g., wheelchair or bedside chair)?: A Little Help needed to walk in hospital room?: A Little Help needed climbing 3-5 steps with a railing? : A Little 6 Click Score: 19    End of Session Equipment Utilized During Treatment: Gait belt Activity Tolerance: Patient tolerated treatment well Patient left: in chair;with call bell/phone within reach Nurse Communication: Mobility status PT Visit Diagnosis: Unsteadiness on feet (R26.81);Other abnormalities of gait and mobility (R26.89);Muscle weakness (generalized) (M62.81);Difficulty in walking, not elsewhere classified (R26.2);Pain Pain - Right/Left: Left Pain - part of body: Knee;Leg     Time: 8883-8867 PT Time Calculation (min) (ACUTE ONLY): 16 min  Charges:    $Gait Training: 8-22 mins $Therapeutic Exercise: 23-37 mins PT General Charges $$ ACUTE PT VISIT: 1 Visit                     [facility] PT Acute Rehabilitation Services Office [phone]    Heriberto Stmartin 10/23/2024, 12:04 PM

## 2024-10-23 NOTE — Discharge Summary (Signed)
 Physician Discharge Summary  Patient ID: Robin [name] MRN: [id number] DOB/AGE: 07/02/1971 53 y.o.  Admit date: 10/22/2024 Discharge date: 10/23/2024  Admission Diagnoses:  S/P total knee arthroplasty, left  Discharge Diagnoses:  Principal Problem:   S/P total knee arthroplasty, left Active Problems:   Osteoarthritis of left knee   Past Medical History:  Diagnosis Date   Allergy    Anemia    Anxiety    Asthma    Bilateral carpal tunnel syndrome    Degeneration of lumbar intervertebral disc    Depression    GERD (gastroesophageal reflux disease)    Hyperlipidemia    Hypertension    OSA (obstructive sleep apnea)    has cpap   Osteoarthritis of both knees    Pneumonia    Pre-diabetes    Rheumatoid arthritis (HCC)    Sarcoidosis    Spinal stenosis    Vitamin D deficiency     Surgeries: Procedure(s): ARTHROPLASTY, KNEE, TOTAL, USING IMAGELESS COMPUTER-ASSISTED NAVIGATION on 10/22/2024   Consultants (if any):   Discharged Condition: Improved  Hospital Course: Robin [name] is an 53 y.o. female who was admitted 10/22/2024 with a diagnosis of S/P total knee arthroplasty, left and went to the operating room on 10/22/2024 and underwent the above named procedures.    She was given perioperative antibiotics:  Anti-infectives (From admission, onward)    Start     Dose/Rate Route Frequency Ordered Stop   10/22/24 2100  ceFAZolin  (ANCEF ) IVPB 2g/100 mL premix        2 g 200 mL/hr over 30 Minutes Intravenous Every 6 hours 10/22/24 1830 10/23/24 0700   10/22/24 0800  ceFAZolin  (ANCEF ) IVPB 3g/150 mL premix        3 g 300 mL/hr over 30 Minutes Intravenous On call to O.R. 10/22/24 0755 10/22/24 1117       She was given sequential compression devices, early ambulation, and aspirin  for DVT prophylaxis.  POD#1 Patient doing well. She ambulated 90 feet x2 with PT. D/c home with OPPT. F/u in office in 2 weeks for repeat evaluation.   She benefited maximally  from the hospital stay and there were no complications.    Recent vital signs:  Vitals:   10/23/24 0644 10/23/24 0948  BP: 135/78 (!) 143/77  Pulse: 80 86  Resp: 18 18  Temp: 97.9 F (36.6 C) 99.1 F (37.3 C)  SpO2: 100% 100%    Recent laboratory studies:  Lab Results  Component Value Date   HGB 11.2 (L) 10/23/2024   HGB 13.5 10/15/2024   HGB 11.3 (L) 06/12/2024   Lab Results  Component Value Date   WBC 12.3 (H) 10/23/2024   PLT 225 10/23/2024   Lab Results  Component Value Date   INR 1.2 06/15/2021   Lab Results  Component Value Date   NA 142 10/23/2024   K 4.1 10/23/2024   CL 107 10/23/2024   CO2 24 10/23/2024   BUN 13 10/23/2024   CREATININE 0.85 10/23/2024   GLUCOSE 115 (H) 10/23/2024     Allergies as of 10/23/2024       Reactions   Lisinopril Cough        Medication List     PAUSE taking these medications    acetaminophen  650 MG CR tablet Wait to take this until your doctor or other care provider tells you to start again. Commonly known as: TYLENOL  Take 1,300 mg by mouth every 8 (eight) hours as needed (pain).   Orencia  ClickJect 125 MG/ML  Soaj Wait to take this until your doctor or other care provider tells you to start again. Generic drug: Abatacept  Inject 125 mg into the skin once a week.       STOP taking these medications    diclofenac  Sodium 1 % Gel Commonly known as: VOLTAREN        TAKE these medications    albuterol  108 (90 Base) MCG/ACT inhaler Commonly known as: VENTOLIN  HFA Inhale 2 puffs into the lungs every 6 (six) hours as needed for wheezing or shortness of breath.   amLODipine  10 MG tablet Commonly known as: NORVASC  Take 1 tablet (10 mg total) by mouth daily.   Aspirin  Low Dose 81 MG chewable tablet Generic drug: aspirin  Chew 1 tablet (81 mg total) by mouth 2 (two) times daily with a meal.   atorvastatin  20 MG tablet Commonly known as: LIPITOR Take 1 tablet (20 mg total) by mouth daily.   BIOTIN  PO Take 1 tablet by mouth daily.   buPROPion  200 MG 12 hr tablet Commonly known as: WELLBUTRIN  SR Take 100-200 mg by mouth See admin instructions. Take 200 mg by mouth in the morning and take 100 mg in the evening   CALCIUM  PO Take 1 tablet by mouth daily.   carvedilol  12.5 MG tablet Commonly known as: COREG  Take 1 tablet (12.5 mg total) by mouth 2 (two) times daily with a meal.   cetirizine 10 MG tablet Commonly known as: ZYRTEC Take 10 mg by mouth daily as needed for allergies.   cyclobenzaprine  10 MG tablet Commonly known as: FLEXERIL  Take 10 mg by mouth at bedtime as needed.   docusate sodium  100 MG capsule Commonly known as: Colace Take 1 capsule (100 mg total) by mouth 2 (two) times daily. What changed:  when to take this reasons to take this   escitalopram  20 MG tablet Commonly known as: LEXAPRO  Take 20 mg by mouth daily.   fluticasone  50 MCG/ACT nasal spray Commonly known as: FLONASE  Place 1 spray into both nostrils daily as needed for allergies.   HYDROcodone -acetaminophen  10-325 MG tablet Commonly known as: NORCO Take 1 tablet by mouth every 4 (four) hours as needed for severe pain (pain score 7-10) (postoperative pain.). What changed:  when to take this reasons to take this   losartan -hydrochlorothiazide  100-25 MG tablet Commonly known as: HYZAAR Take 1 tablet by mouth daily.   Melatonin 10 MG Caps Take 20 mg by mouth at bedtime as needed (sleep).   methocarbamol  500 MG tablet Commonly known as: ROBAXIN  Take 1 tablet (500 mg total) by mouth every 6 (six) hours as needed for muscle spasms.   MULTIVITAMIN ADULT PO Take 1 tablet by mouth daily.   omeprazole 40 MG capsule Commonly known as: PRILOSEC Take 40 mg by mouth daily.   ondansetron  4 MG tablet Commonly known as: Zofran  Take 1 tablet (4 mg total) by mouth every 8 (eight) hours as needed for nausea or vomiting.   OVER THE COUNTER MEDICATION Take 1 tablet by mouth daily. Menopause  Supplement   polyethylene glycol powder 17 GM/SCOOP powder Commonly known as: MiraLax  Take 17 g by mouth daily as needed for mild constipation or moderate constipation. Dissolve 1 capful (17g) in 4-8 ounces of liquid and take by mouth daily.   pregabalin  75 MG capsule Commonly known as: LYRICA  Take 1 capsule (75 mg total) by mouth 2 (two) times daily.   senna 8.6 MG Tabs tablet Commonly known as: SENOKOT Take 2 tablets (17.2 mg total) by mouth  at bedtime for 15 days.   tirzepatide 2.5 MG/0.5ML injection vial Commonly known as: ZEPBOUND Inject 2.5 mg into the skin once a week.   triamcinolone  lotion 0.1 % Commonly known as: KENALOG  Apply 1 Application topically 3 (three) times daily.               Discharge Care Instructions  (From admission, onward)           Start     Ordered   10/23/24 0000  Weight bearing as tolerated        10/23/24 0735   10/23/24 0000  Change dressing       Comments: Do not remove your dressing.   10/23/24 0735              WEIGHT BEARING   Weight bearing as tolerated with assist device (walker, cane, etc) as directed, use it as long as suggested by your surgeon or therapist, typically at least 4-6 weeks.   EXERCISES  Results after joint replacement surgery are often greatly improved when you follow the exercise, range of motion and muscle strengthening exercises prescribed by your doctor. Safety measures are also important to protect the joint from further injury. Any time any of these exercises cause you to have increased pain or swelling, decrease what you are doing until you are comfortable again and then slowly increase them. If you have problems or questions, call your caregiver or physical therapist for advice.   Rehabilitation is important following a joint replacement. After just a few days of immobilization, the muscles of the leg can become weakened and shrink (atrophy).  These exercises are designed to build up the tone  and strength of the thigh and leg muscles and to improve motion. Often times heat used for twenty to thirty minutes before working out will loosen up your tissues and help with improving the range of motion but do not use heat for the first two weeks following surgery (sometimes heat can increase post-operative swelling).   These exercises can be done on a training (exercise) mat, on the floor, on a table or on a bed. Use whatever works the best and is most comfortable for you.    Use music or television while you are exercising so that the exercises are a pleasant break in your day. This will make your life better with the exercises acting as a break in your routine that you can look forward to.   Perform all exercises about fifteen times, three times per day or as directed.  You should exercise both the operative leg and the other leg as well.  Exercises include:   Quad Sets - Tighten up the muscle on the front of the thigh (Quad) and hold for 5-10 seconds.   Straight Leg Raises - With your knee straight (if you were given a brace, keep it on), lift the leg to 60 degrees, hold for 3 seconds, and slowly lower the leg.  Perform this exercise against resistance later as your leg gets stronger.  Leg Slides: Lying on your back, slowly slide your foot toward your buttocks, bending your knee up off the floor (only go as far as is comfortable). Then slowly slide your foot back down until your leg is flat on the floor again.  Angel Wings: Lying on your back spread your legs to the side as far apart as you can without causing discomfort.  Hamstring Strength:  Lying on your back, push your heel against the floor with  your leg straight by tightening up the muscles of your buttocks.  Repeat, but this time bend your knee to a comfortable angle, and push your heel against the floor.  You may put a pillow under the heel to make it more comfortable if necessary.   A rehabilitation program following joint replacement  surgery can speed recovery and prevent re-injury in the future due to weakened muscles. Contact your doctor or a physical therapist for more information on knee rehabilitation.    CONSTIPATION  Constipation is defined medically as fewer than three stools per week and severe constipation as less than one stool per week.  Even if you have a regular bowel pattern at home, your normal regimen is likely to be disrupted due to multiple reasons following surgery.  Combination of anesthesia, postoperative narcotics, change in appetite and fluid intake all can affect your bowels.   YOU MUST use at least one of the following options; they are listed in order of increasing strength to get the job done.  They are all available over the counter, and you may need to use some, POSSIBLY even all of these options:    Drink plenty of fluids (prune juice may be helpful) and high fiber foods Colace 100 mg by mouth twice a day  Senokot for constipation as directed and as needed Dulcolax (bisacodyl ), take with full glass of water  Miralax  (polyethylene glycol) once or twice a day as needed.  If you have tried all these things and are unable to have a bowel movement in the first 3-4 days after surgery call either your surgeon or your primary doctor.    If you experience loose stools or diarrhea, hold the medications until you stool forms back up.  If your symptoms do not get better within 1 week or if they get worse, check with your doctor.  If you experience the worst abdominal pain ever or develop nausea or vomiting, please contact the office immediately for further recommendations for treatment.   ITCHING:  If you experience itching with your medications, try taking only a single pain pill, or even half a pain pill at a time.  You can also use Benadryl  over the counter for itching or also to help with sleep.   TED HOSE STOCKINGS:  Use stockings on both legs until for at least 2 weeks or as directed by physician  office. They may be removed at night for sleeping.  MEDICATIONS:  See your medication summary on the "After Visit Summary" that nursing will review with you.  You may have some home medications which will be placed on hold until you complete the course of blood thinner medication.  It is important for you to complete the blood thinner medication as prescribed.  PRECAUTIONS:  If you experience chest pain or shortness of breath - call 911 immediately for transfer to the hospital emergency department.   If you develop a fever greater that 101 F, purulent drainage from wound, increased redness or drainage from wound, foul odor from the wound/dressing, or calf pain - CONTACT YOUR SURGEON.                                                   FOLLOW-UP APPOINTMENTS:  If you do not already have a post-op appointment, please call the office for an appointment to be seen  by your surgeon.  Guidelines for how soon to be seen are listed in your "After Visit Summary", but are typically between 1-4 weeks after surgery.  OTHER INSTRUCTIONS:   Knee Replacement:  Do not place pillow under knee, focus on keeping the knee straight while resting. CPM instructions: 0-90 degrees, 2 hours in the morning, 2 hours in the afternoon, and 2 hours in the evening. Place foam block, curve side up under heel at all times except when in CPM or when walking.  DO NOT modify, tear, cut, or change the foam block in any way.   MAKE SURE YOU:  Understand these instructions.  Get help right away if you are not doing well or get worse.    Thank you for letting us  be a part of your medical care team.  It is a privilege we respect greatly.  We hope these instructions will help you stay on track for a fast and full recovery!   Diagnostic Studies: DG Knee Left Port Result Date: 10/22/2024 CLINICAL DATA:  Knee arthritis EXAM: PORTABLE LEFT KNEE - 1-2 VIEW COMPARISON:  01/09/2023, FINDINGS: Status post left knee replacement with intact  hardware and normal alignment. Moderate gas in the soft tissues consistent with recent surgery IMPRESSION: Status post left knee replacement with expected postsurgical change. Electronically Signed   By: Luke Bun M.D.   On: 10/22/2024 15:45   CT HEAD WO CONTRAST ( ) Result Date: 10/16/2024 EXAM: CT HEAD WITHOUT CONTRAST 10/14/2024 08:06:01 AM TECHNIQUE: CT of the head was performed without the administration of intravenous contrast. Automated exposure control, iterative reconstruction, and/or weight based adjustment of the mA/kV was utilized to reduce the radiation dose to as low as reasonably achievable. COMPARISON: None available. CLINICAL HISTORY: Forgetfulness, Retrograde amnesia; Symptoms approximately 2 years; Patient denies head pain. FINDINGS: BRAIN AND VENTRICLES: No acute hemorrhage. No evidence of acute infarct. No hydrocephalus. No extra-axial collection. No mass effect or midline shift. Partially empty sella. ORBITS: No acute abnormality. SINUSES: No acute abnormality. SOFT TISSUES AND SKULL: No acute soft tissue abnormality. No skull fracture. IMPRESSION: 1. No acute intracranial abnormality. Electronically signed by: Franky Stanford MD 10/16/2024 03:30 AM EDT RP Workstation: [id number]    Disposition: Discharge disposition: 01-Home or Self Care       Discharge Instructions     Call MD / Call 911   Complete by: As directed    If you experience chest pain or shortness of breath, CALL 911 and be transported to the hospital emergency room.  If you develope a fever above 101 F, pus (white drainage) or increased drainage or redness at the wound, or calf pain, call your surgeon's office.   Change dressing   Complete by: As directed    Do not remove your dressing.   Constipation Prevention   Complete by: As directed    Drink plenty of fluids.  Prune juice may be helpful.  You may use a stool softener, such as Colace (over the counter) 100 mg twice a day.  Use MiraLax  (over the  counter) for constipation as needed.   Diet - low sodium heart healthy   Complete by: As directed    Discharge instructions   Complete by: As directed    Elevate toes above nose. Use cryotherapy as needed for pain and swelling.   Do not put a pillow under the knee. Place it under the heel.   Complete by: As directed    Driving restrictions   Complete by: As directed  No driving for 6 weeks   Increase activity slowly as tolerated   Complete by: As directed    Lifting restrictions   Complete by: As directed    No lifting for 6 weeks   Post-operative opioid taper instructions:   Complete by: As directed    POST-OPERATIVE OPIOID TAPER INSTRUCTIONS: It is important to wean off of your opioid medication as soon as possible. If you do not need pain medication after your surgery it is ok to stop day one. Opioids include: Codeine, Hydrocodone (Norco, Vicodin), Oxycodone (Percocet, oxycontin ) and hydromorphone  amongst others.  Long term and even short term use of opiods can cause: Increased pain response Dependence Constipation Depression Respiratory depression And more.  Withdrawal symptoms can include Flu like symptoms Nausea, vomiting And more Techniques to manage these symptoms Hydrate well Eat regular healthy meals Stay active Use relaxation techniques(deep breathing, meditating, yoga) Do Not substitute Alcohol to help with tapering If you have been on opioids for less than two weeks and do not have pain than it is ok to stop all together.  Plan to wean off of opioids This plan should start within one week post op of your joint replacement. Maintain the same interval or time between taking each dose and first decrease the dose.  Cut the total daily intake of opioids by one tablet each day Next start to increase the time between doses. The last dose that should be eliminated is the evening dose.      TED hose   Complete by: As directed    Use stockings (TED hose) for 2  weeks on both leg(s).  You may remove them at night for sleeping.   Weight bearing as tolerated   Complete by: As directed         Follow-up Information     Leigh Valery RAMAN, PA-C. Schedule an appointment as soon as possible for a visit in 2 week(s).   Specialty: Orthopedic Surgery Why: For suture removal, For wound re-check Contact information: [address], [address] [place] [address] [phone]                  Signed: Valery RAMAN Leigh 10/23/2024, 12:26 PM

## 2024-10-23 NOTE — TOC Transition Note (Signed)
 Transition of Care [facility]) - Discharge Note   Patient Details  Name: Robin [name] MRN: [id number] Date of Birth: 1971-03-17  Transition of Care [facility]) CM/SW Contact:  Alfonse JONELLE Rex, RN Phone Number: 10/23/2024, 9:58 AM   Clinical Narrative:   Patient admitted for scheduled L TKA, OPPT-EO, RW delivered to bedside by [employer]. No TOC needs     Final next level of care: OP Rehab Barriers to Discharge: No Barriers Identified   Patient Goals and CMS Choice Patient states their goals for this hospitalization and ongoing recovery are:: return home          Discharge Placement                       Discharge Plan and Services Additional resources added to the After Visit Summary for                                       Social Drivers of Health (SDOH) Interventions SDOH Screenings   Food Insecurity: No Food Insecurity (10/22/2024)  Housing: Low Risk  (10/22/2024)  Transportation Needs: No Transportation Needs (10/22/2024)  Utilities: Not At Risk (10/22/2024)  Alcohol Screen: Low Risk  (04/23/2023)  Financial Resource Strain: Not at Risk (09/08/2024)   Received from [facility]  Physical Activity: Not at Risk (09/08/2024)   Received from [facility]  Social Connections: Not at Risk (09/08/2024)   Received from [facility]  Recent Concern: Social Connections - Moderately Isolated (06/11/2024)  Stress: Not at Risk (09/08/2024)   Received from [facility]  Tobacco Use: Medium Risk (10/22/2024)     Readmission Risk Interventions     No data to display

## 2024-10-23 NOTE — Progress Notes (Signed)
 Discharge medications delivered to the patient at the bedside in a secure bag

## 2024-10-24 NOTE — Anesthesia Postprocedure Evaluation (Signed)
 Anesthesia Post Note  Patient: Robin [name]  Procedure(s) Performed: ARTHROPLASTY, KNEE, TOTAL, USING IMAGELESS COMPUTER-ASSISTED NAVIGATION (Left: Knee)     Patient location during evaluation: PACU Anesthesia Type: General Level of consciousness: awake and alert Pain management: pain level controlled Vital Signs Assessment: post-procedure vital signs reviewed and stable Respiratory status: spontaneous breathing, nonlabored ventilation, respiratory function stable and patient connected to nasal cannula oxygen Cardiovascular status: blood pressure returned to baseline and stable Postop Assessment: no apparent nausea or vomiting Anesthetic complications: no   No notable events documented.  Last Vitals:  Vitals:   10/23/24 0948 10/23/24 1235  BP: (!) 143/77 (!) 122/96  Pulse: 86 71  Resp: 18 20  Temp: 37.3 C 37.1 C  SpO2: 100% 100%    Last Pain:  Vitals:   10/23/24 1305  TempSrc:   PainSc: 8                  Epifanio Lamar BRAVO

## 2024-10-27 ENCOUNTER — Encounter: Payer: Self-pay | Admitting: Nurse Practitioner

## 2024-10-27 DIAGNOSIS — G4733 Obstructive sleep apnea (adult) (pediatric): Secondary | ICD-10-CM

## 2024-10-28 MED ORDER — ZEPBOUND 5 MG/0.5ML ~~LOC~~ SOAJ: 5.0000 mg | SUBCUTANEOUS | 0 refills | Status: DC

## 2024-10-28 NOTE — Telephone Encounter (Signed)
 No, there was a 5 mg dose sent to her pharmacy on file at her last OV that she was to start after 4 weeks of the 2.5 mg so they should have it. I'll resend to the [facility] pharmacy on file; advise her to call in the next hour or so to ensure they have received and processed it. If she has any further issues, may want to consider alternative pharmacy. Thanks.

## 2024-10-29 ENCOUNTER — Telehealth: Payer: Self-pay

## 2024-10-29 ENCOUNTER — Ambulatory Visit: Admitting: Internal Medicine

## 2024-10-29 DIAGNOSIS — M0609 Rheumatoid arthritis without rheumatoid factor, multiple sites: Secondary | ICD-10-CM

## 2024-10-29 DIAGNOSIS — M7918 Myalgia, other site: Secondary | ICD-10-CM

## 2024-10-29 DIAGNOSIS — G5603 Carpal tunnel syndrome, bilateral upper limbs: Secondary | ICD-10-CM

## 2024-10-29 DIAGNOSIS — Z79899 Other long term (current) drug therapy: Secondary | ICD-10-CM

## 2024-10-29 NOTE — Telephone Encounter (Signed)
 Your request has been approved Request Reference Number: [id number]. ZEPBOUND INJ 5/0.5ML is approved through 12/24/2025. Your patient may now fill this prescription and it will be covered. Authorization Expiration12/31/2026

## 2024-10-29 NOTE — Telephone Encounter (Signed)
 Seems like insurance is saying there is prior auth needed for zepbound

## 2024-10-29 NOTE — Telephone Encounter (Signed)
 ATC x1.  Left detailed message per DPR.  Sent mychart message as well.

## 2024-10-29 NOTE — Telephone Encounter (Signed)
*  Pulm  Pharmacy Patient Advocate Encounter   Received notification from Pt Calls Messages that prior authorization for Zepbound 5MG /0.5ML pen-injectors   is required/requested.   Insurance verification completed.   The patient is insured through [facility].   Per test claim: PA required; PA submitted to above mentioned insurance via Latent Key/confirmation #/EOC [id number] Status is pending

## 2024-11-12 ENCOUNTER — Encounter: Payer: Self-pay | Admitting: Nurse Practitioner

## 2024-11-12 ENCOUNTER — Ambulatory Visit: Admitting: Nurse Practitioner

## 2024-11-12 VITALS — BP 126/84 | HR 72 | Temp 98.2°F | Ht 70.0 in | Wt 270.4 lb

## 2024-11-12 DIAGNOSIS — Z6838 Body mass index (BMI) 38.0-38.9, adult: Secondary | ICD-10-CM | POA: Diagnosis not present

## 2024-11-12 DIAGNOSIS — E66813 Obesity, class 3: Secondary | ICD-10-CM | POA: Diagnosis not present

## 2024-11-12 DIAGNOSIS — G4733 Obstructive sleep apnea (adult) (pediatric): Secondary | ICD-10-CM

## 2024-11-12 MED ORDER — ZEPBOUND 7.5 MG/0.5ML ~~LOC~~ SOAJ: 7.5000 mg | SUBCUTANEOUS | 0 refills | Status: DC

## 2024-11-12 MED ORDER — ZEPBOUND 10 MG/0.5ML ~~LOC~~ SOAJ: 10.0000 mg | SUBCUTANEOUS | 0 refills | Status: AC

## 2024-11-12 NOTE — Assessment & Plan Note (Signed)
 Starting BMI 40 prior to Zepbound. Current BMI 38. Will continue GLP 1 therapy. See above plan

## 2024-11-12 NOTE — Progress Notes (Signed)
 @Patient  ID: Robin [name], female    DOB: 1971/05/03, 53 y.o.   MRN: [id number]  Chief Complaint  Patient presents with   Obstructive Sleep Apnea    F/u    Referring provider: Buck Search, PA-C  HPI: 53 year old female, former remote smoker followed for severe OSA. Past medical history significant for HTN, OSA not on CPAP, RA, anxiety, depression, vit d deficiency.   TEST/EVENTS:  2010 NPSG: AHI 8/h 06/12/2023 Split Night Study: AHI 30.7/h, SpO2 low 88% >> optimal pressure 14 cmH2O; weight 330 lb  02/15/2023: Ov with Jyllian Haynie NP for sleep consult, referred by Dr. Alvan.  She was previously seen in our office by Dr. Chip in 2015.  She had mild obstructive sleep apnea diagnosed in 2010.  She was started on CPAP therapy and had been on it for many years.  At some point, she lost her insurance and stopped receiving supplies so she stopped wearing the CPAP a few years ago.  She has had a lot of trouble with her sleep, especially over the last year.  She tells me that she wakes up often throughout the night.  Feels anxious when she does.  She has loud snoring.  She feels very tired throughout the day.  Wakes with a dry mouth most mornings.  Denies any morning headaches, drowsy driving, sleep parasomnia/paralysis.  No history of narcolepsy or cataplexy.  No witnessed apneas She goes to bed between 8 PM to 10 PM.  Usually takes a long time to fall asleep, around 4 to 5 hours.  Wakes 3 or more times a night.  Gets up for the day between 7 and 11 AM.  She is currently on disability.  Gained 30 pounds over the last 2 years.  Does not take any sleep aids. She has a history of high blood pressure, which has been difficult to control recently.  That is another reason why she was encouraged to come back for sleep evaluation.  She also has asthma, high cholesterol and allergies. She was a former remote smoker, quit in 1998.  Drinks alcohol  occasionally, usually 1 glass a month.  Drinks 1 to 2 cups of  coffee a day.  Lives alone.  Family history of asthma, heart disease, rheumatism and cancer.   Epworth 16  08/07/2023: OV with Kamari Bilek NP for follow up after split night study which revealed severe sleep apnea. She feels unchanged compared to our last visit. Continues to have a lot of trouble with her sleep. Wakes often throughout the night. Feels tired during the day. She denies any morning headaches, drowsy driving, sleep parasomnias/paralysis. She is preparing to have weight loss surgery. They were wanting her seen and started on CPAP therapy prior to this.   10/31/2023: OV with Lillion Elbert NP.  The patient underwent gastric bypass surgery approximately two weeks ago and reports a smooth recovery process. She is on a liquid diet right now but will start incorporating more solids tomorrow, which she is looking forward to. She's lost 30 lb so far. Regarding her CPAP, she has been experiencing skin irritation from her mask, which has led to inconsistent use over the past few weeks. The patient has not yet tried any mask liners. She does like wearing her CPAP at night. She feels like she sleeps much better with it. Energy levels have been down the last two weeks with recovery and being off CPAP. The patient reports no issues with drowsy driving. She has been using a large CPAP  Dream Wear mask.   02/06/2024: SHERLEAN with Shundra Wirsing NP Patient presents today for follow up. She has been back on CPAP and doing well on CPAP since her last visit for the most part. She has continued to lose weight and is down another 20 lb. She feels like sometimes her mask blows too hard, which results in her removing it at night. She does feel like when she wears the CPAP, she sleeps better and wakes up more refreshed. Energy levels are better. She is wearing a full face mask - irritation subsided with mask liners. Denies any drowsy driving or sleep parasomnias/paralysis. 01/06/2024-02/04/2024: CPAP 12-16 cmH2O 28/30 days; 73% >4 hr; average use 5 hr  45 min Pressure 95th 12.5 Leaks 95th 30.5 AHI 2.7  09/23/2024: OV with Lashundra Shiveley NP Rielynn G Tamaiya Bump is a 53 year old female with sleep apnea who presents with issues related to CPAP use. She is experiencing difficulties with her CPAP therapy, primarily due to discomfort and anxiety associated with the full face mask. She feels 'real nervous' and anxious when attempting to wear the mask, a sensation that began after her knee replacement surgery. Additionally, she has skin irritation, including flakiness and occasional bleeding around her nose and mouth, despite using a cover for the mask. She notes that the pressure from the full face mask felt 'a little strong'. She is working on weight loss, which she believes may help with her sleep apnea. She mentions being at a weight loss plateau for months and is considering additional interventions to assist with this. She engages in physical activity, including walking in the mornings, and is planning further knee surgery, which she hopes will alleviate some back pain she is experiencing. No drowsy driving. Feels better when she's able to use her CPAP. Energy levels improve with it. Feels fatigued without it.  11/12/2024: Today - follow up Discussed the use of AI scribe software for clinical note transcription with the patient, who gave verbal consent to proceed. History of Present Illness Robin [name] is a 53 year old female who presents for follow-up regarding her weight management and CPAP therapy.  She has been on Zepbound  for weight management, currently on the 5 lb. She has lost 35 pounds over the past year, with 10 pounds lost since starting Zepbound . No side effects from the medication are reported and tolerating well. No hx of medullary thyroid  cancer personally or in her family.   She reports no issues with her CPAP therapy. She is using a new mask and finds it 'perfect'. Pressure settings from last visit helped. Wearing  nightly. No drowsy driving. Energy levels are good during the day.   She underwent bilateral knee replacement surgeries recently. She feels like a 'new woman' after her knee surgeries. Undergoing PT.   She plans to visit her family in [place]  for the holidays.    Allergies  Allergen Reactions   Lisinopril Cough    Immunization History  Administered Date(s) Administered   Influenza,inj,quad, With Preservative 09/25/2019   Influenza-Unspecified 10/20/2017   MODERNA COVID-19 SARS-COV-2 PEDS BIVALENT BOOSTER 83yr-29yr 01/07/2020, 02/04/2020   Unspecified SARS-COV-2 Vaccination 01/07/2020, 02/04/2020    Past Medical History:  Diagnosis Date   Allergy    Anemia    Anxiety    Asthma    Bilateral carpal tunnel syndrome    Degeneration of lumbar intervertebral disc    Depression    GERD (gastroesophageal reflux disease)    Hyperlipidemia    Hypertension  OSA (obstructive sleep apnea)    has cpap   Osteoarthritis of both knees    Pneumonia    Pre-diabetes    Rheumatoid arthritis (HCC)    Sarcoidosis    Spinal stenosis    Vitamin D deficiency     Tobacco History: Social History   Tobacco Use  Smoking Status Former   Current packs/day: 0.00   Average packs/day: 0.1 packs/day for 2.0 years (0.2 ttl pk-yrs)   Types: Cigarettes   Start date: 12/25/1994   Quit date: 12/25/1996   Years since quitting: 27.9   Passive exposure: Never  Smokeless Tobacco Never  Tobacco Comments   smoked 2-3 cigs daily x 2 years   Counseling given: Not Answered Tobacco comments: smoked 2-3 cigs daily x 2 years   Outpatient Medications Prior to Visit  Medication Sig Dispense Refill   acetaminophen  (TYLENOL ) 650 MG CR tablet Take 1,300 mg by mouth every 8 (eight) hours as needed (pain).     albuterol  (VENTOLIN  HFA) 108 (90 Base) MCG/ACT inhaler Inhale 2 puffs into the lungs every 6 (six) hours as needed for wheezing or shortness of breath. 18 g 2   amLODipine  (NORVASC ) 10 MG tablet Take 1  tablet (10 mg total) by mouth daily. 30 tablet 11   aspirin  (ASPIRIN  CHILDRENS) 81 MG chewable tablet Chew 1 tablet (81 mg total) by mouth 2 (two) times daily with a meal. 90 tablet 0   atorvastatin  (LIPITOR) 20 MG tablet Take 1 tablet (20 mg total) by mouth daily. 30 tablet 11   BIOTIN PO Take 1 tablet by mouth daily.     buPROPion  (WELLBUTRIN  SR) 200 MG 12 hr tablet Take 100-200 mg by mouth See admin instructions. Take 200 mg by mouth in the morning and take 100 mg in the evening     CALCIUM  PO Take 1 tablet by mouth daily.     carvedilol  (COREG ) 12.5 MG tablet Take 1 tablet (12.5 mg total) by mouth 2 (two) times daily with a meal. 60 tablet 5   cetirizine (ZYRTEC) 10 MG tablet Take 10 mg by mouth daily as needed for allergies.     docusate sodium  (COLACE) 100 MG capsule Take 1 capsule (100 mg total) by mouth 2 (two) times daily. 60 capsule 0   escitalopram  (LEXAPRO ) 20 MG tablet Take 20 mg by mouth daily.     fluticasone  (FLONASE ) 50 MCG/ACT nasal spray Place 1 spray into both nostrils daily as needed for allergies.     HYDROcodone -acetaminophen  (NORCO) 10-325 MG tablet Take 1 tablet by mouth every 4 (four) hours as needed for severe pain (pain score 7-10) (postoperative pain.). 42 tablet 0   losartan -hydrochlorothiazide  (HYZAAR) 100-25 MG tablet Take 1 tablet by mouth daily. 90 tablet 1   Melatonin 10 MG CAPS Take 20 mg by mouth at bedtime as needed (sleep).     Multiple Vitamin (MULTIVITAMIN ADULT PO) Take 1 tablet by mouth daily.     omeprazole (PRILOSEC) 40 MG capsule Take 40 mg by mouth daily.     ondansetron  (ZOFRAN ) 4 MG tablet Take 1 tablet (4 mg total) by mouth every 8 (eight) hours as needed for nausea or vomiting. 30 tablet 0   OVER THE COUNTER MEDICATION Take 1 tablet by mouth daily. Menopause Supplement     polyethylene glycol powder (MIRALAX ) 17 GM/SCOOP powder Take 17 g by mouth daily as needed for mild constipation or moderate constipation. Dissolve 1 capful (17g) in 4-8 ounces  of liquid and take by mouth  daily. 238 g 0   pregabalin  (LYRICA ) 75 MG capsule Take 1 capsule (75 mg total) by mouth 2 (two) times daily. 180 capsule 0   tirzepatide (ZEPBOUND) 5 MG/0.5ML Pen Inject 5 mg into the skin once a week. 2 mL 0   triamcinolone  lotion (KENALOG ) 0.1 % Apply 1 Application topically 3 (three) times daily.     Abatacept  (ORENCIA  CLICKJECT) 125 MG/ML SOAJ Inject 125 mg into the skin once a week. (Patient not taking: Reported on 11/12/2024) 12 mL 0   cyclobenzaprine  (FLEXERIL ) 10 MG tablet Take 10 mg by mouth at bedtime as needed. (Patient not taking: Reported on 11/12/2024)     methocarbamol  (ROBAXIN ) 500 MG tablet Take 1 tablet (500 mg total) by mouth every 6 (six) hours as needed for muscle spasms. (Patient not taking: Reported on 11/12/2024) 20 tablet 0   No facility-administered medications prior to visit.     Review of Systems: as above    Physical Exam:  BP 126/84   Pulse 72   Temp 98.2 F (36.8 C)   Ht 5' 10 (1.778 m) Comment: Per pt  Wt 270 lb 6.4 oz (122.7 kg)   SpO2 98% Comment: RA  BMI 38.80 kg/m   GEN: Pleasant, interactive, well-appearing; obese; in no acute distress. HEENT:  Normocephalic and atraumatic. PERRLA. Sclera white. Nasal turbinates pink, moist and patent bilaterally. No rhinorrhea present. Oropharynx pink and moist, without exudate or edema. No lesions, ulcerations, or postnasal drip. Mallampati III NECK:  Supple w/ fair ROM.  CV: RRR, no m/r/g PULMONARY:  Unlabored, regular breathing. Clear bilaterally A&P w/o wheezes/rales/rhonchi. No accessory muscle use.  GI: BS present and normoactive. Soft, non-tender to palpation. MSK: No erythema, warmth or tenderness. Cap refil <2 sec all extrem.  Neuro: A/Ox3. No focal deficits noted.   Skin: Warm, no lesions or rashe Psych: Normal affect and behavior. Judgement and thought content appropriate.     Lab Results:  CBC    Component Value Date/Time   WBC 12.3 (H) 10/23/2024 0408   RBC  4.06 10/23/2024 0408   HGB 11.2 (L) 10/23/2024 0408   HGB 13.5 01/30/2022 1637   HCT 35.9 (L) 10/23/2024 0408   HCT 42.7 01/30/2022 1637   PLT 225 10/23/2024 0408   PLT 312 01/30/2022 1637   MCV 88.4 10/23/2024 0408   MCV 85 01/30/2022 1637   MCH 27.6 10/23/2024 0408   MCHC 31.2 10/23/2024 0408   RDW 12.9 10/23/2024 0408   RDW 11.8 01/30/2022 1637   LYMPHSABS 5,185 (H) 07/25/2023 1514   LYMPHSABS 4.2 (H) 01/30/2022 1637   MONOABS 1.5 (H) 04/19/2022 1015   EOSABS 173 04/28/2024 1514   EOSABS 0.3 01/30/2022 1637   BASOSABS 51 04/28/2024 1514   BASOSABS 0.0 01/30/2022 1637    BMET    Component Value Date/Time   NA 142 10/23/2024 0408   NA 143 04/04/2022 1426   K 4.1 10/23/2024 0408   CL 107 10/23/2024 0408   CO2 24 10/23/2024 0408   GLUCOSE 115 (H) 10/23/2024 0408   BUN 13 10/23/2024 0408   BUN 15 04/04/2022 1426   CREATININE 0.85 10/23/2024 0408   CREATININE 0.73 04/28/2024 1514   CALCIUM  8.9 10/23/2024 0408   GFRNONAA >60 10/23/2024 0408   GFRAA  10/12/2009 0600    >60        The eGFR has been calculated using the MDRD equation. This calculation has not been validated in all clinical situations. eGFR's persistently <60 mL/min signify possible Chronic Kidney  Disease.    BNP No results found for: BNP   Imaging:  DG Knee Left Port Result Date: 10/22/2024 CLINICAL DATA:  Knee arthritis EXAM: PORTABLE LEFT KNEE - 1-2 VIEW COMPARISON:  01/09/2023, FINDINGS: Status post left knee replacement with intact hardware and normal alignment. Moderate gas in the soft tissues consistent with recent surgery IMPRESSION: Status post left knee replacement with expected postsurgical change. Electronically Signed   By: Luke Bun M.D.   On: 10/22/2024 15:45   CT HEAD WO CONTRAST ( ) Result Date: 10/16/2024 EXAM: CT HEAD WITHOUT CONTRAST 10/14/2024 08:06:01 AM TECHNIQUE: CT of the head was performed without the administration of intravenous contrast. Automated exposure  control, iterative reconstruction, and/or weight based adjustment of the mA/kV was utilized to reduce the radiation dose to as low as reasonably achievable. COMPARISON: None available. CLINICAL HISTORY: Forgetfulness, Retrograde amnesia; Symptoms approximately 2 years; Patient denies head pain. FINDINGS: BRAIN AND VENTRICLES: No acute hemorrhage. No evidence of acute infarct. No hydrocephalus. No extra-axial collection. No mass effect or midline shift. Partially empty sella. ORBITS: No acute abnormality. SINUSES: No acute abnormality. SOFT TISSUES AND SKULL: No acute soft tissue abnormality. No skull fracture. IMPRESSION: 1. No acute intracranial abnormality. Electronically signed by: Franky Stanford MD 10/16/2024 03:30 AM EDT RP Workstation: [id number]    Administration History     None           No data to display          No results found for: NITRICOXIDE      Assessment & Plan:   OSA (obstructive sleep apnea) Severe OSA on CPAP. Excellent compliance and control. Receives benefit from use. Aware of risks of untreated severe OSA. Encouraged continued healthy weight loss measures. Safe driving practices reviewed.   Patient has severe sleep apnea related to obesity with BMI 40, posing significant cardiovascular risks. Patient has failed traditional weight loss measures with diet and exercise for >/6 months. Patient will continue on Zepbound  (tirzepatide ) for weight management. Zepbound  is the only pharmaceutical treatment approved for moderate-to-severe OSA in adults who are overweight (BMI >/27) or obese (BMI >/30). The patient will continue lifestyle modifications, including structured nutrition and physical activity as directed. No other GLP1 therapy will be used simultaneously at this time. The patient does not have any FDA labeled contraindications to this agent, including pregnancy, lactation, hx or family history of medullary thyroid  cancer, or multiple endocrine neoplasia type II.  Side effect profile has been reviewed with patient. Aware of red flag symptoms to notify of immediately or seek emergency care, including severe nausea/vomiting, inability to pass bowels or gas, severe abdominal pain/tenderness, jaundice.  Tolerating well. 10 lb of weight loss with Zepbound  thus far. Will continue to titrate, as tolerated, to goal of 10-15 mg weekly for maintenance   Patient Instructions  Continue to use CPAP every night, minimum of 4-6 hours a night.  Change equipment as directed. Wash your tubing with warm soap and water  daily, hang to dry. Wash humidifier portion weekly. Use bottled, distilled water  and change daily Be aware of reduced alertness and do not drive or operate heavy machinery if experiencing this or drowsiness.  Exercise encouraged, as tolerated. Healthy weight management discussed.  Avoid or decrease alcohol  consumption and medications that make you more sleepy, if possible. Notify if persistent daytime sleepiness occurs even with consistent use of PAP therapy.   Continue Zepbound  injections. After you complete the four weeks of the 5 mg, increase to 7.5 mg weekly for four weeks  on 12/5 and then on 1/2, go up to 10 mg weekly if still tolerating well. Goal of getting to 10-15 mg once weekly for maintenance so we will just reassess at your follow up and see how the 10 mg is working  Work on diet measures and exercise - 150 min/week. 30 minutes of strength training 3-5 days a week once cleared by physical therapy and orthopedics  We reviewed emergent symptoms to notify of immediately or seek emergency care, including severe nausea/vomiting, inability to pass bowels or gas, severe abdominal pain/tenderness, jaundice, swelling of the face/tongue.  Call if you are having difficulties with any side effects so we can help manage these   Follow up the week of 1/5 with Katie Rasheena Talmadge,NP. If symptoms do not improve or worsen, please contact office for sooner follow up   Notify  your provider if you are planning to have a procedure/surgery, as this medication will need to be stopped prior.       Obesity, Class III, BMI 40-49.9 (morbid obesity) (HCC) Starting BMI 40 prior to Zepbound. Current BMI 38. Will continue GLP 1 therapy. See above plan     I spent 35 minutes of dedicated to the care of this patient on the date of this encounter to include pre-visit review of records, face-to-face time with the patient discussing conditions above, post visit ordering of testing, clinical documentation with the electronic health record, making appropriate referrals as documented, and communicating necessary findings to members of the patients care team.  Comer LULLA Rouleau, NP 11/12/2024  Pt aware and understands NP's role.

## 2024-11-12 NOTE — Patient Instructions (Addendum)
 Continue to use CPAP every night, minimum of 4-6 hours a night.  Change equipment as directed. Wash your tubing with warm soap and water daily, hang to dry. Wash humidifier portion weekly. Use bottled, distilled water and change daily Be aware of reduced alertness and do not drive or operate heavy machinery if experiencing this or drowsiness.  Exercise encouraged, as tolerated. Healthy weight management discussed.  Avoid or decrease alcohol consumption and medications that make you more sleepy, if possible. Notify if persistent daytime sleepiness occurs even with consistent use of PAP therapy.   Continue Zepbound injections. After you complete the four weeks of the 5 mg, increase to 7.5 mg weekly for four weeks on 12/5 and then on 1/2, go up to 10 mg weekly if still tolerating well. Goal of getting to 10-15 mg once weekly for maintenance so we will just reassess at your follow up and see how the 10 mg is working  Work on diet measures and exercise - 150 min/week. 30 minutes of strength training 3-5 days a week once cleared by physical therapy and orthopedics  We reviewed emergent symptoms to notify of immediately or seek emergency care, including severe nausea/vomiting, inability to pass bowels or gas, severe abdominal pain/tenderness, jaundice, swelling of the face/tongue.  Call if you are having difficulties with any side effects so we can help manage these   Follow up the week of 1/5 with Katie Chiamaka Latka,NP. If symptoms do not improve or worsen, please contact office for sooner follow up   Notify your provider if you are planning to have a procedure/surgery, as this medication will need to be stopped prior.

## 2024-11-12 NOTE — Assessment & Plan Note (Addendum)
 Severe OSA on CPAP. Excellent compliance and control. Receives benefit from use. Aware of risks of untreated severe OSA. Encouraged continued healthy weight loss measures. Safe driving practices reviewed.   Patient has severe sleep apnea related to obesity with BMI 40, posing significant cardiovascular risks. Patient has failed traditional weight loss measures with diet and exercise for >/6 months. Patient will continue on Zepbound (tirzepatide) for weight management. Zepbound is the only pharmaceutical treatment approved for moderate-to-severe OSA in adults who are overweight (BMI >/27) or obese (BMI >/30). The patient will continue lifestyle modifications, including structured nutrition and physical activity as directed. No other GLP1 therapy will be used simultaneously at this time. The patient does not have any FDA labeled contraindications to this agent, including pregnancy, lactation, hx or family history of medullary thyroid  cancer, or multiple endocrine neoplasia type II. Side effect profile has been reviewed with patient. Aware of red flag symptoms to notify of immediately or seek emergency care, including severe nausea/vomiting, inability to pass bowels or gas, severe abdominal pain/tenderness, jaundice.  Tolerating well. 10 lb of weight loss with Zepbound thus far. Will continue to titrate, as tolerated, to goal of 10-15 mg weekly for maintenance   Patient Instructions  Continue to use CPAP every night, minimum of 4-6 hours a night.  Change equipment as directed. Wash your tubing with warm soap and water daily, hang to dry. Wash humidifier portion weekly. Use bottled, distilled water and change daily Be aware of reduced alertness and do not drive or operate heavy machinery if experiencing this or drowsiness.  Exercise encouraged, as tolerated. Healthy weight management discussed.  Avoid or decrease alcohol consumption and medications that make you more sleepy, if possible. Notify if persistent  daytime sleepiness occurs even with consistent use of PAP therapy.   Continue Zepbound injections. After you complete the four weeks of the 5 mg, increase to 7.5 mg weekly for four weeks on 12/5 and then on 1/2, go up to 10 mg weekly if still tolerating well. Goal of getting to 10-15 mg once weekly for maintenance so we will just reassess at your follow up and see how the 10 mg is working  Work on diet measures and exercise - 150 min/week. 30 minutes of strength training 3-5 days a week once cleared by physical therapy and orthopedics  We reviewed emergent symptoms to notify of immediately or seek emergency care, including severe nausea/vomiting, inability to pass bowels or gas, severe abdominal pain/tenderness, jaundice, swelling of the face/tongue.  Call if you are having difficulties with any side effects so we can help manage these   Follow up the week of 1/5 with Katie Andruw Battie,NP. If symptoms do not improve or worsen, please contact office for sooner follow up   Notify your provider if you are planning to have a procedure/surgery, as this medication will need to be stopped prior.

## 2024-11-21 ENCOUNTER — Other Ambulatory Visit: Payer: Self-pay | Admitting: Internal Medicine

## 2024-11-21 ENCOUNTER — Other Ambulatory Visit (HOSPITAL_COMMUNITY): Payer: Self-pay

## 2024-11-21 DIAGNOSIS — M0609 Rheumatoid arthritis without rheumatoid factor, multiple sites: Secondary | ICD-10-CM

## 2024-11-21 DIAGNOSIS — Z79899 Other long term (current) drug therapy: Secondary | ICD-10-CM

## 2024-11-24 ENCOUNTER — Other Ambulatory Visit: Payer: Self-pay | Admitting: Internal Medicine

## 2024-11-24 ENCOUNTER — Other Ambulatory Visit (HOSPITAL_COMMUNITY): Payer: Self-pay

## 2024-11-24 DIAGNOSIS — M7918 Myalgia, other site: Secondary | ICD-10-CM

## 2024-11-24 NOTE — Telephone Encounter (Deleted)
 Last Fill: 07/17/2034  Labs: 10/23/2024 Glucose 115 WBC 12.3 Hemoglobin 11.2 HCT 35.9   TB Gold: 04/28/2024 TB Gold Negative   Next Visit: Due around 10/29/2024 . Message sent to the front to schedule.   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

## 2024-11-24 NOTE — Telephone Encounter (Signed)
 Last Fill: 07/29/2024  Next Visit: Due November 2025. Message sent to the front to schedule.   Last Visit: 07/29/2024  Dx:  Myofascial pain   Current Dose per office note on 07/29/2024: pregabalin  75 mg BID as tolerated   Okay to refill Lyrica ?

## 2024-11-24 NOTE — Telephone Encounter (Signed)
 Please schedule patient a follow up visit. Patient due November 2025. Thanks!   Follow-Up Instructions: Return in about 3 months (around 10/29/2024) for RA on ABA/Lyr f/u 3mos.

## 2024-11-25 ENCOUNTER — Other Ambulatory Visit (HOSPITAL_COMMUNITY): Payer: Self-pay

## 2024-12-08 ENCOUNTER — Encounter: Admitting: Psychology

## 2024-12-08 DIAGNOSIS — R41840 Attention and concentration deficit: Secondary | ICD-10-CM

## 2024-12-08 DIAGNOSIS — Z818 Family history of other mental and behavioral disorders: Secondary | ICD-10-CM

## 2024-12-08 DIAGNOSIS — F321 Major depressive disorder, single episode, moderate: Secondary | ICD-10-CM | POA: Insufficient documentation
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
## 2024-12-09 ENCOUNTER — Other Ambulatory Visit: Payer: Self-pay

## 2024-12-11 ENCOUNTER — Other Ambulatory Visit: Payer: Self-pay

## 2024-12-11 ENCOUNTER — Telehealth: Payer: Self-pay | Admitting: Pharmacist

## 2024-12-11 NOTE — Telephone Encounter (Signed)
 Received notice from [facility] that [employer]  will no longer be covered in 2026  Submitted a Prior Authorization renewal request to [facility] for [employer]  SQ via CoverMyMeds. Will update once we receive a response.  Key: [id number]

## 2024-12-12 NOTE — Telephone Encounter (Signed)
 Received notification from [facility] regarding a prior authorization for [employer]  SQ. Authorization has been APPROVED from 12/11/2024 to 12/24/2025. Approval letter sent to scan center.  Authorization # [id number]  Sherry Pennant, PharmD, MPH, BCPS, CPP Clinical Pharmacist

## 2024-12-15 ENCOUNTER — Other Ambulatory Visit: Payer: Self-pay

## 2024-12-17 ENCOUNTER — Other Ambulatory Visit (HOSPITAL_COMMUNITY): Payer: Self-pay

## 2024-12-26 ENCOUNTER — Ambulatory Visit: Admitting: Nurse Practitioner

## 2025-01-01 ENCOUNTER — Encounter: Attending: Psychology

## 2025-01-01 DIAGNOSIS — F321 Major depressive disorder, single episode, moderate: Secondary | ICD-10-CM | POA: Insufficient documentation

## 2025-01-01 DIAGNOSIS — R41840 Attention and concentration deficit: Secondary | ICD-10-CM | POA: Diagnosis not present

## 2025-01-01 DIAGNOSIS — Z818 Family history of other mental and behavioral disorders: Secondary | ICD-10-CM | POA: Diagnosis not present

## 2025-01-01 NOTE — Progress Notes (Signed)
 "  Mental Status/Behavioral Observations (01/01/2025):  Sensory/Arousal: Hearing was adequate for the demands of testing. Vision was adequate with the assistance of corrective lenses. The patient was alert. Appearance: Dress and hygiene were appropriate for the setting.  Speech/Language: In conversation, the patient's speech was prosodic, fluent, and well-articulated. The patient displayed no indications of word finding difficulties and no word substitution errors were observed.  Motor: The patient ambulated slowly but independently. No tremors were observed.  Social Comportment: Social behavior was appropriate for the setting. Mood/Affect: Mood was largely neutral to positive. Affect was congruent with mood. There were some indications of mild anxiety throughout the testing session.  Attention/Concentration: The patient appeared to maintain consistent engagement throughout the testing session. No frank attentional lapses were observed.  Thought Process/Content: The patient's thought process was coherent, linear, goal directed. There were no indications of psychosis.  Additional Observations: The patient showed no difficulties with understanding task instructions. Minimal difficulties with frustration tolerance were noted. The patient showed some indications of  possible restlessness throughout the testing session as evidenced by repeatedly bouncing her leg while completing tasks.   Neuropsychology Note Robin [name] completed 150 minutes of neuropsychological testing with technician, Josue Ned, BA, under the supervision of Evalene Riff, PsyD., Clinical Neuropsychologist. The patient did not appear overtly distressed by the testing session, per behavioral observation or via self-report to the technician. Rest breaks were offered.   Clinical Decision Making: In considering the patient's current level of functioning, level of presumed impairment, nature of symptoms, emotional and behavioral  responses during clinical interview, level of literacy, and observed level of motivation/effort, a battery of tests was selected by Dr. Riff during initial consultation on 12/08/2024. This was communicated to the technician. Communication between the neuropsychologist and technician was ongoing throughout the testing session and changes were made as deemed necessary based on patient performance on testing, technician observations and additional pertinent factors such as those listed above.  Tests Administered: Automatic Data Edition (BNT-2) Brief Visuospatial Memory Test-Revised (BVMT-R) Benton Judgment of Line Orientation (JOLO) [place]  Verbal Learning Test-Third Edition (CVLT-3) Clock Drawing Test Controlled Oral Word Association Test (FAS & Animals) Delis-Kaplan Executive Function System (D-KEFS), select subtests Rey Complex Figure Test (RCFT), select subtests Trail Making Test (TMT; Part A & B) Wechsler Adult Intelligence Scale-Fourth Edition (WAIS-IV), select subtests Wechsler Abbreviated Scale of Intelligence CIVIL SERVICE FAST STREAMER) Wechsler Memory Scale-Fourth Edition (WMS-IV) , select subtests The Beck Depression Inventory-II (BDI-II) Beck Anxiety Inventory (BAI)  NEUROPSYCHOLOGICAL TEST RESULTS: Note: This summary of test scores accompanies the interpretive report and should not be interpreted by unqualified individuals or in isolation without reference to the report.   Test scores are relative to age and further adjusted for educational history and demographics as available when appropriate.  Measurement properties of test scores: IQ, Index, and Standard Scores (SS): Mean = 100; Standard Deviation = 15; Scaled Scores (ss): Mean = 10; Standard Deviation = 3; Z scores (Z): Mean = 0; Standard Deviation = 1; T scores (T); Mean = 50; Standard Deviation = 10  Intellectual/Premorbid Functioning Estimate   Norm Score Percentile  Range  WASI-II FSIQ-2  SS = 95 37 %ile Average   Vocabulary    T = 48 42 %ile Average   Matrix Reasoning   T = 47 37 %ile Average   ATTENTION AND WORKING MEMORY    Norm Score Percentile  Range  WAIS-IV          Digit Span  ss = 8 25 %ile Average  DSF  ss = 8 25 %ile Average   Span:    5      DSB  ss = 10 50 %ile Average   Span:    4      DSS  ss = 7 16 %ile Low Average   Span:    4      PROCESSING SPEED    Norm Score Percentile  Range  WAIS-IV          Coding  ss = 8 25 %ile Average   Symbol Search  ss = 10 50 %ile Average   LANGUAGE    Norm Score Percentile  Range  [place] Naming Test (BNT-2)  T = 34 5 %ile Below Average  COWAT          FAS  T = 44 27 %ile Average   Animals  T = 50 50 %ile Average   EXECUTIVE FUNCTIONING    Norm Score Percentile  Range  DKEFS - Color-Word Interference          Color Naming  ss = 8 25 %ile Average   Word Reading  ss = 2 0.4 %ile Exceptionally Low   Inhibition  ss = 11 63 %ile Average   Errors  ss = 12 75 %ile High Average   Inhibition Switching  ss = 7 16 %ile Low Average   Errors  ss = 9 37 %ile Average  TMT A  T = 54 66 %ile Average  TMT B   T = 56 73 %ile Average   MEMORY    Norm Score Percentile  Range  BVMT-R          Trial 1  T = 41.0 18 %ile Low Average   Trial 2  T = 34 5 %ile Below Average   Trial 3  T = 42.0 21 %ile Low Average   Total Recall  T = 37.0 9 %ile Low Average   Learning  T = 52.0 58 %ile Average   Delayed Recall  T = 51.0 53 %ile Average   % Retained    113 >16 %ile WNL   Hits     11-16 %ile Low Average   False Alarms     >16 %ile WNL   Recognition Discriminability     11-16 %ile Low Average  CVLT-III          Trial 1  ss = 5.0 5 %ile Below Average   Trial 2  ss = 9.0 37 %ile Average   Trial 3  ss = 6.0 9 %ile Low Average   Trial 4  ss = 5.0 5 %ile Below Average   Trial 5  ss = 9.0 37 %ile Average   Trial B  ss = 10.0 50 %ile Average   Short Delay Free Recall  ss = 5.0 5 %ile Below Average   Short Delay Cued Recall  ss = 5.0 5 %ile Below Average   Long Delay Free  Recall  ss = 7.0 16 %ile Low Average   Long Delay Cued Recall  ss = 6.0 9 %ile Low Average   Total Hits  ss = 7.0 16 %ile Low Average   Total False Positives  ss = 6.0 9 %ile Low Average   Recognition Discriminability  ss = 5.0 5 %ile Below Average   Total Intrusions  ss = 5.0 5 %ile Below Average   Trials 1-5 Total Correct  SS = 81 10 %ile Low Average   Total  Repetitions  ss = 10.0 50 %ile Average   List B vs. Trial 1  ss = 12.0 75 %ile High Average   SD (FR) vs. Trial 5 Correct  ss = 5.0 5 %ile Below Average   LD (FR)vs. SD (FR)  ss = 13.0 84 %ile High Average  Wechsler Memory Scale, 4th Edition (WMS-4)         Log. Mem. Immediate Recall  ss = 4 2 %ile Below Average   Logical Memory Delayed Recall  ss = 5 5 %ile Below Average   Logical Recognition    26th-50th  %ile Average   VISUAL-SPATIAL    Norm Score Percentile  Range  WASI-II          Matrix Reasoning  T = 47 37 %ile Average  Benton JOLO     4 %ile Below Average  Rey Complex Figure Copy       6 to 10 %ile Low to Below Average  Clock          PERSONALITY AND BEHAVIORAL FUNCTIONING      Score/Interpretation  BDI Raw       46  BDI Severity       Severe.  BAI Raw       20  BAI Severity       Moderate.    Feedback to Patient: Robin [name] will return on 01/13/2025 for an interactive feedback session with Dr. Hayden at which time her test performances, clinical impressions and treatment recommendations will be reviewed in detail. The patient understands she can contact our office should she require our assistance before this time.  150 minutes spent face-to-face with patient administering standardized tests, 60 minutes spent scoring radiographer, therapeutic). [CPT [id number], [id number]]  Full report to follow. "

## 2025-01-02 ENCOUNTER — Encounter: Payer: Self-pay | Admitting: Nurse Practitioner

## 2025-01-02 ENCOUNTER — Telehealth: Admitting: Nurse Practitioner

## 2025-01-02 VITALS — Ht 70.0 in | Wt 265.0 lb

## 2025-01-02 DIAGNOSIS — E66812 Obesity, class 2: Secondary | ICD-10-CM

## 2025-01-02 DIAGNOSIS — G4733 Obstructive sleep apnea (adult) (pediatric): Secondary | ICD-10-CM

## 2025-01-02 DIAGNOSIS — Z6838 Body mass index (BMI) 38.0-38.9, adult: Secondary | ICD-10-CM

## 2025-01-02 DIAGNOSIS — E66813 Obesity, class 3: Secondary | ICD-10-CM

## 2025-01-02 DIAGNOSIS — Z87891 Personal history of nicotine dependence: Secondary | ICD-10-CM

## 2025-01-02 MED ORDER — ZEPBOUND 12.5 MG/0.5ML ~~LOC~~ SOAJ: 12.5000 mg | SUBCUTANEOUS | 0 refills | Status: AC

## 2025-01-02 NOTE — Assessment & Plan Note (Signed)
 See above. Continue healthy weight loss measures

## 2025-01-02 NOTE — Assessment & Plan Note (Signed)
 Severe OSA on CPAP. Excellent compliance and control. Receives benefit from use. Aware of risks of untreated severe OSA. Encouraged continued healthy weight loss measures. Safe driving practices reviewed.   Patient has severe sleep apnea related to obesity with initial BMI 40, posing significant cardiovascular risks. Patient failed traditional weight loss measures with diet and exercise for >/6 months. Patient will continue on Zepbound  (tirzepatide ) for weight management. Zepbound  is the only pharmaceutical treatment approved for moderate-to-severe OSA in adults who are overweight (BMI >/27) or obese (BMI >/30). The patient will continue lifestyle modifications, including structured nutrition and physical activity as directed. No other GLP1 therapy will be used simultaneously at this time. The patient does not have any FDA labeled contraindications to this agent, including pregnancy, lactation, hx or family history of medullary thyroid  cancer, or multiple endocrine neoplasia type II. Side effect profile has been reviewed with patient. Aware of red flag symptoms to notify of immediately or seek emergency care, including severe nausea/vomiting, inability to pass bowels or gas, severe abdominal pain/tenderness, jaundice.  Tolerating well. She is down almost 20 lb with Zepbound  thus far. Will continue to titrate, as tolerated, to goal of 10-15 mg weekly for maintenance   Patient Instructions  Continue to use CPAP every night, minimum of 4-6 hours a night.  Change equipment as directed. Wash your tubing with warm soap and water  daily, hang to dry. Wash humidifier portion weekly. Use bottled, distilled water  and change daily Be aware of reduced alertness and do not drive or operate heavy machinery if experiencing this or drowsiness.  Exercise encouraged, as tolerated. Healthy weight management discussed.  Avoid or decrease alcohol  consumption and medications that make you more sleepy, if possible. Notify if  persistent daytime sleepiness occurs even with consistent use of PAP therapy.   Continue Zepbound  injections. After you complete the four weeks of the 10 mg, increase to 12.5 mg weekly for four weeks. Maintenance therapy is between 10-15 mg once weekly so we will just reassess at your follow up and see how the 12.5 mg is working Work on diet measures and exercise - 150 min/week. 30 minutes of strength training 3-5 days a week once cleared by physical therapy and orthopedics  We reviewed emergent symptoms to notify of immediately or seek emergency care, including severe nausea/vomiting, inability to pass bowels or gas, severe abdominal pain/tenderness, jaundice, swelling of the face/tongue.  Call if you are having difficulties with any side effects so we can help manage these Let us  know if the Zepbound  is unaffordable. Talk to your pharmacist about your deductible and out of pocket cost. Your Zepbound  was approved to be covered through December 2026 by your insurance.    Follow up with Almarie Ferrari via video visit on 2/20. If symptoms worsen, please contact office for sooner follow up  Notify your provider if you are planning to have a procedure/surgery, as this medication will need to be stopped prior.

## 2025-01-02 NOTE — Progress Notes (Signed)
 "  @Patient  ID: Robin [name], female    DOB: [birth year], 54 y.o.   MRN: [id number]  Chief Complaint  Patient presents with   Sleep Apnea    F/u     Referring provider: Buck Search, PA-C  Virtual Visit via Video Note  I connected with Tyrina G Bishara on 01/02/2025 at  2:00 PM EST by a video enabled telemedicine application and verified that I am speaking with the correct person using two identifiers.  Location: Patient: Home Provider: Office   I discussed the limitations of evaluation and management by telemedicine and the availability of in person appointments. The patient expressed understanding and agreed to proceed.  History of Present Illness: 54 year old female, former remote smoker followed for severe OSA. Past medical history significant for HTN, OSA not on CPAP, RA, anxiety, depression, vit d deficiency.   TEST/EVENTS:  2010 NPSG: AHI 8/h 06/12/2023 Split Night Study: AHI 30.7/h, SpO2 low 88% >> optimal pressure 14 cmH2O; weight 330 lb  02/15/2023: Ov with Rogen Porte NP for sleep consult, referred by Dr. Alvan.  She was previously seen in our office by Dr. Chip in 2015.  She had mild obstructive sleep apnea diagnosed in 2010.  She was started on CPAP therapy and had been on it for many years.  At some point, she lost her insurance and stopped receiving supplies so she stopped wearing the CPAP a few years ago.  She has had a lot of trouble with her sleep, especially over the last year.  She tells me that she wakes up often throughout the night.  Feels anxious when she does.  She has loud snoring.  She feels very tired throughout the day.  Wakes with a dry mouth most mornings.  Denies any morning headaches, drowsy driving, sleep parasomnia/paralysis.  No history of narcolepsy or cataplexy.  No witnessed apneas She goes to bed between 8 PM to 10 PM.  Usually takes a long time to fall asleep, around 4 to 5 hours.  Wakes 3 or more times a night.  Gets up for the day between 7  and 11 AM.  She is currently on disability.  Gained 30 pounds over the last 2 years.  Does not take any sleep aids. She has a history of high blood pressure, which has been difficult to control recently.  That is another reason why she was encouraged to come back for sleep evaluation.  She also has asthma, high cholesterol and allergies. She was a former remote smoker, quit in 1998.  Drinks alcohol  occasionally, usually 1 glass a month.  Drinks 1 to 2 cups of coffee a day.  Lives alone.  Family history of asthma, heart disease, rheumatism and cancer.   [place] 16  08/07/2023: OV with Lecia Esperanza NP for follow up after split night study which revealed severe sleep apnea. She feels unchanged compared to our last visit. Continues to have a lot of trouble with her sleep. Wakes often throughout the night. Feels tired during the day. She denies any morning headaches, drowsy driving, sleep parasomnias/paralysis. She is preparing to have weight loss surgery. They were wanting her seen and started on CPAP therapy prior to this.   10/31/2023: OV with Arland Usery NP.  The patient underwent gastric bypass surgery approximately two weeks ago and reports a smooth recovery process. She is on a liquid diet right now but will start incorporating more solids tomorrow, which she is looking forward to. She's lost 30 lb so far. Regarding her CPAP,  she has been experiencing skin irritation from her mask, which has led to inconsistent use over the past few weeks. The patient has not yet tried any mask liners. She does like wearing her CPAP at night. She feels like she sleeps much better with it. Energy levels have been down the last two weeks with recovery and being off CPAP. The patient reports no issues with drowsy driving. She has been using a large CPAP Dream Wear mask.   02/06/2024: SHERLEAN with Shiloh Southern NP Patient presents today for follow up. She has been back on CPAP and doing well on CPAP since her last visit for the most part. She has  continued to lose weight and is down another 20 lb. She feels like sometimes her mask blows too hard, which results in her removing it at night. She does feel like when she wears the CPAP, she sleeps better and wakes up more refreshed. Energy levels are better. She is wearing a full face mask - irritation subsided with mask liners. Denies any drowsy driving or sleep parasomnias/paralysis. 01/06/2024-02/04/2024: CPAP 12-16 cmH2O 28/30 days; 73% >4 hr; average use 5 hr 45 min Pressure 95th 12.5 Leaks 95th 30.5 AHI 2.7  09/23/2024: OV with Tamzin Bertling NP Lenoir G Robbie Nangle is a 54 year old female with sleep apnea who presents with issues related to CPAP use. She is experiencing difficulties with her CPAP therapy, primarily due to discomfort and anxiety associated with the full face mask. She feels 'real nervous' and anxious when attempting to wear the mask, a sensation that began after her knee replacement surgery. Additionally, she has skin irritation, including flakiness and occasional bleeding around her nose and mouth, despite using a cover for the mask. She notes that the pressure from the full face mask felt 'a little strong'. She is working on weight loss, which she believes may help with her sleep apnea. She mentions being at a weight loss plateau for months and is considering additional interventions to assist with this. She engages in physical activity, including walking in the mornings, and is planning further knee surgery, which she hopes will alleviate some back pain she is experiencing. No drowsy driving. Feels better when she's able to use her CPAP. Energy levels improve with it. Feels fatigued without it.  11/12/2024: OV with Jerriann Schrom NP Lorilynn G Destany Severns is a 54 year old female who presents for follow-up regarding her weight management and CPAP therapy. She has been on Zepbound  for weight management, currently on the 5 lb. She has lost 35 pounds over the past year, with 10 pounds  lost since starting Zepbound . No side effects from the medication are reported and tolerating well. No hx of medullary thyroid  cancer personally or in her family.  She reports no issues with her CPAP therapy. She is using a new mask and finds it 'perfect'. Pressure settings from last visit helped. Wearing nightly. No drowsy driving. Energy levels are good during the day.  She underwent bilateral knee replacement surgeries recently. She feels like a 'new woman' after her knee surgeries. Undergoing PT.  She plans to visit her family in [place]  for the holidays.   01/02/2025: Today - follow up Discussed the use of AI scribe software for clinical note transcription with the patient, who gave verbal consent to proceed. History of Present Illness  Yamilee G Aubrianne Molyneux is a 54 year old female who presents for follow-up on weight management and medication review.  She is currently taking Zepbound  10 mg for  weight management and reports feeling well on the increased dose. She has lost approximately 5 pounds since her last visit in mid-November, bringing her weight down to 265 pounds. She is scheduled to take her second dose of 10 mg today. No GI side effects.   She experiences hot flashes, which she initially thought could be related to Zepbound , but after researching, she did not find a connection. She believes this is related to her menopause. She plans to discuss with her PCP at her upcoming follow up appointment.   She is unsure what the cost of her Zepbound  will be now that her deductible has started over for the year. It was approved for coverage through 12/24/2025 by PA team prior to starting therapy. She will discuss with the pharmacist when she goes to pick up the next prescription and notify of any issues.   Regarding her CPAP, she uses it nightly. Sleeps well with it. No issues with pressure settings or mask fit. Energy levels are better with CPAP use and weight loss. No drowsy driving.      Allergies  Allergen Reactions   Lisinopril Cough    Immunization History  Administered Date(s) Administered   Influenza,inj,quad, With Preservative 09/25/2019   Influenza-Unspecified 10/20/2017   MODERNA COVID-19 SARS-COV-2 PEDS BIVALENT BOOSTER 46yr-59yr 01/07/2020, 02/04/2020   Unspecified SARS-COV-2 Vaccination 01/07/2020, 02/04/2020    Past Medical History:  Diagnosis Date   Allergy    Anemia    Anxiety    Asthma    Bilateral carpal tunnel syndrome    Degeneration of lumbar intervertebral disc    Depression    GERD (gastroesophageal reflux disease)    Hyperlipidemia    Hypertension    OSA (obstructive sleep apnea)    has cpap   Osteoarthritis of both knees    Pneumonia    Pre-diabetes    Rheumatoid arthritis (HCC)    Sarcoidosis    Spinal stenosis    Vitamin D deficiency     Tobacco History: Social History   Tobacco Use  Smoking Status Former   Current packs/day: 0.00   Average packs/day: 0.1 packs/day for 2.0 years (0.2 ttl pk-yrs)   Types: Cigarettes   Start date: 12/25/1994   Quit date: 12/25/1996   Years since quitting: 28.0   Passive exposure: Never  Smokeless Tobacco Never  Tobacco Comments   smoked 2-3 cigs daily x 2 years   Counseling given: Not Answered Tobacco comments: smoked 2-3 cigs daily x 2 years   Outpatient Medications Prior to Visit  Medication Sig Dispense Refill   acetaminophen  (TYLENOL ) 650 MG CR tablet Take 1,300 mg by mouth every 8 (eight) hours as needed (pain).     albuterol  (VENTOLIN  HFA) 108 (90 Base) MCG/ACT inhaler Inhale 2 puffs into the lungs every 6 (six) hours as needed for wheezing or shortness of breath. 18 g 2   amLODipine  (NORVASC ) 10 MG tablet Take 1 tablet (10 mg total) by mouth daily. 30 tablet 11   atorvastatin  (LIPITOR) 20 MG tablet Take 1 tablet (20 mg total) by mouth daily. 30 tablet 11   BIOTIN PO Take 1 tablet by mouth daily.     buPROPion  (WELLBUTRIN  SR) 200 MG 12 hr tablet Take 100-200 mg by mouth  See admin instructions. Take 200 mg by mouth in the morning and take 100 mg in the evening     CALCIUM  PO Take 1 tablet by mouth daily.     carvedilol  (COREG ) 12.5 MG tablet Take 1 tablet (12.5 mg  total) by mouth 2 (two) times daily with a meal. 60 tablet 5   cetirizine (ZYRTEC) 10 MG tablet Take 10 mg by mouth daily as needed for allergies.     cyclobenzaprine  (FLEXERIL ) 10 MG tablet Take 10 mg by mouth at bedtime as needed.     escitalopram  (LEXAPRO ) 20 MG tablet Take 20 mg by mouth daily.     fluticasone  (FLONASE ) 50 MCG/ACT nasal spray Place 1 spray into both nostrils daily as needed for allergies.     HYDROcodone -acetaminophen  (NORCO) 10-325 MG tablet Take 1 tablet by mouth every 4 (four) hours as needed for severe pain (pain score 7-10) (postoperative pain.). 42 tablet 0   losartan -hydrochlorothiazide  (HYZAAR) 100-25 MG tablet Take 1 tablet by mouth daily. 90 tablet 1   Melatonin 10 MG CAPS Take 20 mg by mouth at bedtime as needed (sleep).     Multiple Vitamin (MULTIVITAMIN ADULT PO) Take 1 tablet by mouth daily.     omeprazole (PRILOSEC) 40 MG capsule Take 40 mg by mouth daily.     ondansetron  (ZOFRAN ) 4 MG tablet Take 1 tablet (4 mg total) by mouth every 8 (eight) hours as needed for nausea or vomiting. 30 tablet 0   OVER THE COUNTER MEDICATION Take 1 tablet by mouth daily. Menopause Supplement     pregabalin  (LYRICA ) 75 MG capsule Take 1 capsule by mouth twice daily 180 capsule 0   tirzepatide  (ZEPBOUND ) 10 MG/0.5ML Pen Inject 10 mg into the skin once a week. 2 mL 0   triamcinolone  lotion (KENALOG ) 0.1 % Apply 1 Application topically 3 (three) times daily.     tirzepatide  (ZEPBOUND ) 5 MG/0.5ML Pen Inject 5 mg into the skin once a week. 2 mL 0   tirzepatide  (ZEPBOUND ) 7.5 MG/0.5ML Pen Inject 7.5 mg into the skin once a week. 2 mL 0   Abatacept  (ORENCIA  CLICKJECT) 125 MG/ML SOAJ Inject 125 mg into the skin once a week. (Patient not taking: Reported on 01/02/2025) 12 mL 0   methocarbamol   (ROBAXIN ) 500 MG tablet Take 1 tablet (500 mg total) by mouth every 6 (six) hours as needed for muscle spasms. (Patient not taking: Reported on 01/02/2025) 20 tablet 0   No facility-administered medications prior to visit.     Review of Systems: as above    Physical Exam:  Patient is well-developed, well-nourished in no acute distress.  Resting comfortably at home.  No labored breathing.  Speech is clear and coherent with logical content.  Patient is alert and oriented at baseline.     Assessment & Plan:   OSA (obstructive sleep apnea) Severe OSA on CPAP. Excellent compliance and control. Receives benefit from use. Aware of risks of untreated severe OSA. Encouraged continued healthy weight loss measures. Safe driving practices reviewed.   Patient has severe sleep apnea related to obesity with initial BMI 40, posing significant cardiovascular risks. Patient failed traditional weight loss measures with diet and exercise for >/6 months. Patient will continue on Zepbound  (tirzepatide ) for weight management. Zepbound  is the only pharmaceutical treatment approved for moderate-to-severe OSA in adults who are overweight (BMI >/27) or obese (BMI >/30). The patient will continue lifestyle modifications, including structured nutrition and physical activity as directed. No other GLP1 therapy will be used simultaneously at this time. The patient does not have any FDA labeled contraindications to this agent, including pregnancy, lactation, hx or family history of medullary thyroid  cancer, or multiple endocrine neoplasia type II. Side effect profile has been reviewed with patient. Aware of red flag  symptoms to notify of immediately or seek emergency care, including severe nausea/vomiting, inability to pass bowels or gas, severe abdominal pain/tenderness, jaundice.  Tolerating well. She is down almost 20 lb with Zepbound  thus far. Will continue to titrate, as tolerated, to goal of 10-15 mg weekly for  maintenance   Patient Instructions  Continue to use CPAP every night, minimum of 4-6 hours a night.  Change equipment as directed. Wash your tubing with warm soap and water  daily, hang to dry. Wash humidifier portion weekly. Use bottled, distilled water  and change daily Be aware of reduced alertness and do not drive or operate heavy machinery if experiencing this or drowsiness.  Exercise encouraged, as tolerated. Healthy weight management discussed.  Avoid or decrease alcohol  consumption and medications that make you more sleepy, if possible. Notify if persistent daytime sleepiness occurs even with consistent use of PAP therapy.   Continue Zepbound  injections. After you complete the four weeks of the 10 mg, increase to 12.5 mg weekly for four weeks. Maintenance therapy is between 10-15 mg once weekly so we will just reassess at your follow up and see how the 12.5 mg is working Work on diet measures and exercise - 150 min/week. 30 minutes of strength training 3-5 days a week once cleared by physical therapy and orthopedics  We reviewed emergent symptoms to notify of immediately or seek emergency care, including severe nausea/vomiting, inability to pass bowels or gas, severe abdominal pain/tenderness, jaundice, swelling of the face/tongue.  Call if you are having difficulties with any side effects so we can help manage these Let us  know if the Zepbound  is unaffordable. Talk to your pharmacist about your deductible and out of pocket cost. Your Zepbound  was approved to be covered through December 2026 by your insurance.    Follow up with Almarie Ferrari via video visit on 2/20. If symptoms worsen, please contact office for sooner follow up  Notify your provider if you are planning to have a procedure/surgery, as this medication will need to be stopped prior.       Class 2 obesity with body mass index (BMI) of 38.0 to 38.9 in adult See above. Continue healthy weight loss measures   I  discussed the assessment and treatment plan with the patient. The patient was provided an opportunity to ask questions and all were answered. The patient agreed with the plan and demonstrated an understanding of the instructions.   The patient was advised to call back or seek an in-person evaluation if the symptoms worsen or if the condition fails to improve as anticipated.  I provided 25 minutes of non-face-to-face time during this encounter.  Comer LULLA Rouleau, NP 01/02/2025  Pt aware and understands NP's role.   "

## 2025-01-02 NOTE — Patient Instructions (Addendum)
 Continue to use CPAP every night, minimum of 4-6 hours a night.  Change equipment as directed. Wash your tubing with warm soap and water  daily, hang to dry. Wash humidifier portion weekly. Use bottled, distilled water  and change daily Be aware of reduced alertness and do not drive or operate heavy machinery if experiencing this or drowsiness.  Exercise encouraged, as tolerated. Healthy weight management discussed.  Avoid or decrease alcohol  consumption and medications that make you more sleepy, if possible. Notify if persistent daytime sleepiness occurs even with consistent use of PAP therapy.   Continue Zepbound  injections. After you complete the four weeks of the 10 mg, increase to 12.5 mg weekly for four weeks. Maintenance therapy is between 10-15 mg once weekly so we will just reassess at your follow up and see how the 12.5 mg is working Work on diet measures and exercise - 150 min/week. 30 minutes of strength training 3-5 days a week once cleared by physical therapy and orthopedics  We reviewed emergent symptoms to notify of immediately or seek emergency care, including severe nausea/vomiting, inability to pass bowels or gas, severe abdominal pain/tenderness, jaundice, swelling of the face/tongue.  Call if you are having difficulties with any side effects so we can help manage these Let us  know if the Zepbound  is unaffordable. Talk to your pharmacist about your deductible and out of pocket cost. Your Zepbound  was approved to be covered through December 2026 by your insurance.    Follow up with Almarie Ferrari via video visit on 2/20. If symptoms worsen, please contact office for sooner follow up  Notify your provider if you are planning to have a procedure/surgery, as this medication will need to be stopped prior.

## 2025-01-07 ENCOUNTER — Encounter: Admitting: Psychology

## 2025-01-13 ENCOUNTER — Encounter: Admitting: Psychology
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
## 2025-01-21 NOTE — Assessment & Plan Note (Signed)
 [name]

## 2025-01-21 NOTE — Progress Notes (Unsigned)
 "  Office Visit Note  Patient: Robin [name]             Date of Birth: 11/20/1971           MRN: [id number]             PCP: Buck Search, PA-C Referring: Buck Search, PA-C Visit Date: 02/03/2025   Subjective:  No chief complaint on file.   History of Present Illness: Robin [name] is a 54 y.o. female here for follow up for seronegative RA and OA now on Orencia  125 mg subcu weekly and lyrica  75 mg BID.     Previous HPI 07/29/2024 Robin [name] is a 54 y.o. female here for follow up for seronegative RA and OA now on Orencia  125 mg subcu weekly and lyrica  75 mg BID.     She underwent right knee replacement surgery and her knee is doing well post-operatively. She completed physical therapy and is now managing independently. She is scheduled to see her doctor soon and hopes to resume her Orencia  medication, which was paused due to an open wound post-surgery.   Since discontinuing Orencia , she has been experiencing joint pain, muscle pain, and bone pain. She is eager to restart Orencia  to alleviate these symptoms.   She is currently taking Lyrica  at a dose of 75 mg twice a day. She initially increased the dose but experienced swelling in her foot, so she adjusted to one pill in the morning and one at night. The lower dose has been effective in managing her symptoms without causing significant side effects.   She reports ongoing issues with carpal tunnel syndrome, which affects both hands, particularly at night. Additionally, she has a cyst on one finger joint and a bony nodule on her thumb, which are painful when bumped.   No recent illnesses or antibiotic use since her last visit. Her recent blood work was completed in July.         Previous HPI 04/28/2024 Robin [name] is a 54 y.o. female here for follow up for seronegative RA and OA now on Orencia  125 mg subcu weekly and lyrica  75 mg BID.     She has experienced increased pain since discontinuing her  medication in March due to a change in insurance from [employer] to [employer]. The pain has slightly increased since being off her medication for over a month.   She was previously on Orencia  and Humira  for rheumatoid arthritis. She is considering switching to an infusion treatment for Orencia  due to cost concerns with her current Medicare plan. She has not had any medication since March and is experiencing more pain as a result.   She is currently taking Lyrica  75 mg, which she finds very helpful, allowing her to 'get around, go places, and do things.' Lyrica  works better than gabapentin , which caused a 'funny feeling' in her head. She is interested in increasing the Lyrica  dose as she is tolerating it well without significant side effects, except for some swelling in one leg, which she attributes to previous surgery on her toes.   She also experiences significant pain from carpal tunnel syndrome, which she manages with wrist braces worn at night and sometimes during the day. She has had a painful but effective injection in the past for this condition. Her knees are also problematic, with bone spurs causing pain, and she was scheduled for knee replacement surgery before her insurance changed. She finds relief from water  exercises, which are low impact  and beneficial for her condition.   She takes cyclobenzaprine  at night as needed, which she finds helpful. She is working on making progress after surgery last year and is cautious about using steroids. No new leg swelling on Lyrica , except for one leg which she attributes to past surgery. Significant pain in her shoulders and hands, particularly from carpal tunnel syndrome. No other side effects from her current medications.         Previous HPI 02/04/2024 Robin [name] is a 54 y.o. female here for follow up for seronegative RA now on Orencia  125 mg subcu weekly and OA..     She experiences widespread pain, particularly in her back,  shoulders, elbows, and hands, with radiation up her arms and legs. The pain is sharp, constant, and exacerbated by cold weather, causing her body to 'lock up' when stepping outside. She reports continuous and severe pain down to her bones and muscles.   She has bilateral carpal tunnel syndrome contributing to hand pain, with swelling described as 'like a mitten' and occasional knee swelling after walking. She experiences tingling and a 'falling asleep sensation' in her hands at night, despite wearing wrist braces. No specific treatments for carpal tunnel syndrome have been tried.   She has difficulty sleeping due to pain, despite using 'sleepy time tea,' and sleeps no more than four hours a night in short intervals. Various medications have been tried for pain, including gabapentin , which was discontinued due to side effects, and Lyrica , stopped due to insurance changes. She is currently taking Wellbutrin  for depression and anxiety.   She has been attempting to exercise at the [facility] but finds the pain overwhelming. Prednisone  provides some relief, though not sufficient. She previously used hydrocodone  for back pain but stopped due to withdrawal symptoms.   She is scheduled to see her orthopedic specialist for a knee replacement consultation, as her weight has decreased sufficiently for the procedure.     Previous HPI 11/01/2023 Robin [name] is a 54 y.o. female here for follow up for seronegative RA now on Orencia  125 mg subcu weekly.  She is currently holding her medication due to going for laparoscopic gastrectomy on October 22.  She is asking today whether she can resume her medication.  She did notice within 1 to 2 weeks of skipping Orencia  starting to have increased joint pain especially in her right hand.  Also reports increased right shoulder pain that is most severe at night and with walking using a cane.  Surgery was uneventful she has been progressing with her diet.  One of the portals  took longer than others to heal but has now scabbed over since 2 or 3 days ago.  Still has more symptoms from bilateral knee pain with weightbearing limits her to walking for only a few minutes at a time and uses cane for offloading.   07/30/2023 Serenah RAYEL SANTIZO is a 54 y.o. female here for follow up for seronegative RA now on Orencia  125 mg subcu weekly.  She not had any major flareup with joint pain or swelling.  Is noticing some ongoing swelling worst around the base of the thumb on left and right hand occasionally involving the second finger as well.  She saw her [facility] provider for pain in both shoulders and across the upper back.  Had x-rays for this and had a left shoulder steroid injection with Dr. Kay last week.  So far this is beneficial discussed plan to try on the right side for  this in a week or 2 if it is continuing to help.  She is walking with cane for assistance for bilateral knee pain this sometimes causes her right elbow to hurt a little bit.   04/25/2023 Ridhi EDESSA JAKUBOWICZ is a 54 y.o. female here for follow up for seronegative RA now on Orencia  125 mg subcu weekly.  She started with first dose of medication at pharmacy clinic appointment on March 6.  Has not experienced any difficulty with the medication or injection site reactions.  Labs checked from April 11 looks fine no problems with new medication start.  So far feels pretty good with this though she still has significant ongoing joint pain.  Worst complaint today in her shoulders and at the right elbow.   02/19/23 Cherilyn COLENA KETTERMAN is a 54 y.o. female here for follow up for seronegative RA on Humira  40 mg subcu q. 14 days.  We just met last month discussed possibly switching to Actemra due to decreased Humira  efficacy.  However after reviewing side effect risks in more detail she is very concerned about this medication and was to look at other options.  Including possible side effects affecting her blood pressure or  pancreatitis or liver function.   01/10/23 Ahaana BARBARA AHART is a 54 y.o. female here for follow up for seronegative RA on Humira  40 mg [place] q14days. Since our last visit she has a lot of ongoing joint pain her worst affected joints are in both knees where she has known severe osteoarthritis.  She is planned for completing series of visco supplementation injections. This is ongoing as a temporizing measure she is not currently candidate for joint replacement surgery due to morbid obesity. She is seeing weight loss clinic for this.  Besides that she still having some ongoing joint pain and swelling in her upper extremities and increase in facial rashes.   12/05/22 Josiane DEMIKA LANGENDERFER is a 54 y.o. female here for follow up for seronegative RA on Humira  40 mg [place] q14days. She had updated labs checked in family medicine clinic November with negative RA serology and normal inflammatory markers. Some improvement in swelling with Humira  but continues to have a lot of pain on a daily basis, worst in knees with known OA also in both hands, right elbow, left shoulder worse. She had injections to her knees with only a few days of benefit. On discussion of lab findings and notes reviewed, patient reports previously thought to have sarcoid with abnormal CXR and eye inflammation problems but was not on disease specific treatment at that time. She also had abnormal labs concerning for lupus for years when younger does not recall the exact details.     Labs reviewed 10/26/22 ANA pos RF neg CCP neg ESR wnl CRP wnl Vit D 1,25 wnl   Previous HPI 04/18/22 Latiffany MARLOWE CINQUEMANI is a 54 y.o. female here for seronegative RA, previously a patient with Dr. Curt. She was taking Humira  previously interrupted due to loss of coverage and leflunomide with questionable response in symptoms. She was diagnosed after visit in 11/2019 with multiple areas of joint pain and frequent swelling particularly in bilateral wrists.   Laboratory work-up at that time including rheumatoid factor, CCP, ACE, HLA-B27, sed rate, and CRP were all negative.  She was started on methotrexate and the leflunomide with lack of response in her symptoms.  She started on Humira  for this and took it for a few months but was then off the medication for at least  about a year due to change in insurance status after losing her job.  She restarted this since 2 months ago so far has not seen a very impressive change in her symptoms.  She is significantly limited by severe lower back pain with degenerative arthritis pursuing disability for this.  Also has advanced osteoarthritis of bilateral knees.  Shoulder pain limits overhead movement or worsened with pressure.  Gets chronic hand pain worse around the thumbs but especially on the radial side of her wrist with fairly chronic swelling currently worse than normal on the right side.  She has had local steroid joint injections in multiple areas but none in the past year these were typically beneficial for at least weeks in the past.  For her osteoarthritis pain she takes diclofenac  twice daily with a pretty good benefit has not experienced any side effects or problems taking this medicine.    DMARD Hx Orencia  - current Humira  - loss of response MTX and LEF - inadequate response   No Rheumatology ROS completed.   PMFS History:  Patient Active Problem List   Diagnosis Date Noted   S/P total knee arthroplasty, left 10/22/2024   Osteoarthritis of right knee 06/11/2024   S/P total knee arthroplasty, right 06/11/2024   Myofascial pain 02/04/2024   Recurrent major depressive disorder, in partial remission 11/01/2023   Status post gastric surgery 10/31/2023   Bilateral shoulder pain 04/25/2023   Dysphagia 04/17/2023   Gastroesophageal reflux disease without esophagitis 04/17/2023   Colon cancer screening 04/17/2023   Constipation 04/17/2023   Bilateral carpal tunnel syndrome 03/12/2023   Positive ANA  (antinuclear antibody) 12/05/2022   Vitamin D deficiency 11/27/2022   High risk medication use 04/19/2022   Financial insecurity 02/13/2022   Anxiety and depression 01/30/2022   Hypercholesterolemia 01/30/2022   Primary hypertension 01/30/2022   Rheumatoid arthritis of multiple sites with negative rheumatoid factor (HCC) 01/30/2022   Osteoarthritis of left knee 06/01/2021   Lumbar spinal stenosis status post L2-L3 TLIF 05/05/2021   Degenerative scoliosis 04/18/2021   Sarcoidosis 05/10/2020   Pain of left hip joint 03/24/2020   Cervical spondylosis without myelopathy 07/16/2019   Neck pain 07/16/2019   Class 2 obesity with body mass index (BMI) of 38.0 to 38.9 in adult 10/18/2018   Primary osteoarthritis of both knees 10/18/2018   Hammertoe of right foot 07/02/2018   OSA (obstructive sleep apnea) 10/26/2014    Past Medical History:  Diagnosis Date   Allergy    Anemia    Anxiety    Asthma    Bilateral carpal tunnel syndrome    Degeneration of lumbar intervertebral disc    Depression    GERD (gastroesophageal reflux disease)    Hyperlipidemia    Hypertension    OSA (obstructive sleep apnea)    has cpap   Osteoarthritis of both knees    Pneumonia    Pre-diabetes    Rheumatoid arthritis (HCC)    Sarcoidosis    Spinal stenosis    Vitamin D deficiency     Family History  Problem Relation Age of Onset   Colon polyps Mother    Asthma Mother    Clotting disorder Mother    Hypertension Mother    Heart failure Father    Colon cancer Neg Hx    Stomach cancer Neg Hx    Esophageal cancer Neg Hx    Rectal cancer Neg Hx    Past Surgical History:  Procedure Laterality Date   ABDOMINAL HYSTERECTOMY  DEBRIDEMENT AND CLOSURE WOUND N/A 06/15/2021   Procedure: Irrigation and debridement and closure of spine wound;  Surgeon: Burnetta Aures, MD;  Location: [facility];  Service: Orthopedics;  Laterality: N/A;   ESOPHAGOGASTRODUODENOSCOPY     FOOT SURGERY Bilateral    Both surgeries  for bunions by Dr. Teresita per pt   gastric sleeve surgery  10/16/2023   KNEE ARTHROPLASTY Right 06/11/2024   Procedure: ARTHROPLASTY, KNEE, TOTAL, USING IMAGELESS COMPUTER-ASSISTED NAVIGATION;  Surgeon: Fidel Rogue, MD;  Location: [facility] ORS;  Service: Orthopedics;  Laterality: Right;   KNEE ARTHROPLASTY Left 10/22/2024   Procedure: ARTHROPLASTY, KNEE, TOTAL, USING IMAGELESS COMPUTER-ASSISTED NAVIGATION;  Surgeon: Fidel Rogue, MD;  Location: [facility] ORS;  Service: Orthopedics;  Laterality: Left;   TRANSFORAMINAL LUMBAR INTERBODY FUSION (TLIF) WITH PEDICLE SCREW FIXATION 1 LEVEL N/A 05/05/2021   Procedure: TRANSFORAMINAL LUMBAR INTERBODY FUSION (TLIF) WITH PEDICLE SCREW FIXATION 1 LEVEL L2-3;  Surgeon: Burnetta Aures, MD;  Location: [facility] OR;  Service: Orthopedics;  Laterality: N/A;  4 hrs   Social History   Social History Narrative   Not on file   Immunization History  Administered Date(s) Administered   Influenza,inj,quad, With Preservative 09/25/2019   Influenza-Unspecified 10/20/2017   MODERNA COVID-19 SARS-COV-2 PEDS BIVALENT BOOSTER 60yr-57yr 01/07/2020, 02/04/2020   Unspecified SARS-COV-2 Vaccination 01/07/2020, 02/04/2020     Objective: Vital Signs: There were no vitals taken for this visit.   Physical Exam   Musculoskeletal Exam: ***   Investigation: No additional findings.  Imaging: No results found.  Recent Labs: Lab Results  Component Value Date   WBC 12.3 (H) 10/23/2024   HGB 11.2 (L) 10/23/2024   PLT 225 10/23/2024   NA 142 10/23/2024   K 4.1 10/23/2024   CL 107 10/23/2024   CO2 24 10/23/2024   GLUCOSE 115 (H) 10/23/2024   BUN 13 10/23/2024   CREATININE 0.85 10/23/2024   BILITOT 0.4 09/23/2024   ALKPHOS 103 09/23/2024   AST 22 09/23/2024   ALT 19 09/23/2024   PROT 6.9 09/23/2024   ALBUMIN  3.9 09/23/2024   CALCIUM  8.9 10/23/2024   GFRAA  10/12/2009    >60        The eGFR has been calculated using the MDRD equation. This calculation has not  been validated in all clinical situations. eGFR's persistently <60 mL/min signify possible Chronic Kidney Disease.   QFTBGOLDPLUS NEGATIVE 04/28/2024    Speciality Comments: No specialty comments available.  Procedures:  No procedures performed Allergies: Lisinopril   Assessment / Plan:     Visit Diagnoses:  Assessment & Plan Rheumatoid arthritis of multiple sites with negative rheumatoid factor (HCC)     High risk medication use     Bilateral carpal tunnel syndrome     Myofascial pain      ***  Follow-Up Instructions: No follow-ups on file.   Fatih Stalvey M Santiago Stenzel, CMA  Note - This record has been created using Animal nutritionist.  Chart creation errors have been sought, but may not always  have been located. Such creation errors do not reflect on  the standard of medical care. "

## 2025-02-03 ENCOUNTER — Ambulatory Visit: Admitting: Internal Medicine

## 2025-02-03 DIAGNOSIS — M0609 Rheumatoid arthritis without rheumatoid factor, multiple sites: Secondary | ICD-10-CM

## 2025-02-03 DIAGNOSIS — M7918 Myalgia, other site: Secondary | ICD-10-CM

## 2025-02-03 DIAGNOSIS — G5603 Carpal tunnel syndrome, bilateral upper limbs: Secondary | ICD-10-CM

## 2025-02-03 DIAGNOSIS — Z79899 Other long term (current) drug therapy: Secondary | ICD-10-CM

## 2025-02-09 ENCOUNTER — Ambulatory Visit: Admitting: Podiatry

## 2025-02-13 ENCOUNTER — Ambulatory Visit: Admitting: Primary Care

## 2025-10-12 ENCOUNTER — Ambulatory Visit: Admitting: Physician Assistant
